# Patient Record
Sex: Male | Born: 1940 | Race: White | Hispanic: No | Marital: Married | Smoking: Never smoker
Health system: Southern US, Community
[De-identification: ages and names within clinical notes are randomized; demographics above are authoritative.]

## PROBLEM LIST (undated history)

## (undated) DIAGNOSIS — K649 Unspecified hemorrhoids: Secondary | ICD-10-CM

## (undated) DIAGNOSIS — K3189 Other diseases of stomach and duodenum: Secondary | ICD-10-CM

## (undated) DIAGNOSIS — I639 Cerebral infarction, unspecified: Secondary | ICD-10-CM

## (undated) DIAGNOSIS — I739 Peripheral vascular disease, unspecified: Secondary | ICD-10-CM

## (undated) DIAGNOSIS — E785 Hyperlipidemia, unspecified: Secondary | ICD-10-CM

## (undated) DIAGNOSIS — I509 Heart failure, unspecified: Secondary | ICD-10-CM

## (undated) DIAGNOSIS — J302 Other seasonal allergic rhinitis: Secondary | ICD-10-CM

## (undated) DIAGNOSIS — E119 Type 2 diabetes mellitus without complications: Secondary | ICD-10-CM

## (undated) DIAGNOSIS — I209 Angina pectoris, unspecified: Secondary | ICD-10-CM

## (undated) DIAGNOSIS — J339 Nasal polyp, unspecified: Secondary | ICD-10-CM

## (undated) DIAGNOSIS — K2970 Gastritis, unspecified, without bleeding: Secondary | ICD-10-CM

## (undated) DIAGNOSIS — I10 Essential (primary) hypertension: Secondary | ICD-10-CM

## (undated) DIAGNOSIS — N400 Enlarged prostate without lower urinary tract symptoms: Secondary | ICD-10-CM

## (undated) DIAGNOSIS — Z951 Presence of aortocoronary bypass graft: Secondary | ICD-10-CM

## (undated) DIAGNOSIS — I2510 Atherosclerotic heart disease of native coronary artery without angina pectoris: Secondary | ICD-10-CM

## (undated) DIAGNOSIS — K449 Diaphragmatic hernia without obstruction or gangrene: Secondary | ICD-10-CM

## (undated) HISTORY — PX: HERNIA REPAIR: SHX51

## (undated) HISTORY — PX: CAROTID ENDARTERECTOMY: SUR193

## (undated) HISTORY — DX: Heart failure, unspecified: I50.9

## (undated) HISTORY — PX: CORONARY ARTERY BYPASS GRAFT: SHX141

## (undated) HISTORY — PX: TONSILLECTOMY: SUR1361

---

## 2014-04-25 DIAGNOSIS — D649 Anemia, unspecified: Secondary | ICD-10-CM | POA: Insufficient documentation

## 2014-04-25 DIAGNOSIS — E119 Type 2 diabetes mellitus without complications: Secondary | ICD-10-CM | POA: Insufficient documentation

## 2014-06-15 DIAGNOSIS — I447 Left bundle-branch block, unspecified: Secondary | ICD-10-CM | POA: Insufficient documentation

## 2014-09-11 ENCOUNTER — Ambulatory Visit: Payer: Self-pay | Admitting: Gastroenterology

## 2015-01-15 LAB — SURGICAL PATHOLOGY

## 2015-04-12 ENCOUNTER — Other Ambulatory Visit: Payer: Self-pay | Admitting: Physician Assistant

## 2015-04-12 DIAGNOSIS — R079 Chest pain, unspecified: Secondary | ICD-10-CM

## 2015-04-18 ENCOUNTER — Ambulatory Visit
Admission: RE | Admit: 2015-04-18 | Discharge: 2015-04-18 | Disposition: A | Discharge status: Home / Self Care | Payer: Medicare Other | Source: Ambulatory Visit | Attending: Physician Assistant | Admitting: Physician Assistant

## 2015-04-18 DIAGNOSIS — R079 Chest pain, unspecified: Secondary | ICD-10-CM | POA: Diagnosis present

## 2015-04-18 DIAGNOSIS — I700 Atherosclerosis of aorta: Secondary | ICD-10-CM | POA: Diagnosis not present

## 2015-04-18 DIAGNOSIS — Z951 Presence of aortocoronary bypass graft: Secondary | ICD-10-CM | POA: Diagnosis not present

## 2015-04-18 HISTORY — DX: Presence of aortocoronary bypass graft: Z95.1

## 2015-04-18 HISTORY — DX: Essential (primary) hypertension: I10

## 2015-04-18 HISTORY — DX: Type 2 diabetes mellitus without complications: E11.9

## 2015-04-18 MED ORDER — IOHEXOL 300 MG/ML  SOLN
75.0000 mL | Freq: Once | INTRAMUSCULAR | Status: AC | PRN
  Administered 2015-04-18: 75 mL via INTRAVENOUS

## 2016-05-19 ENCOUNTER — Ambulatory Visit
Admission: RE | Admit: 2016-05-19 | Discharge: 2016-05-19 | Disposition: A | Discharge status: Home / Self Care | Payer: Medicare Other | Source: Ambulatory Visit | Attending: Cardiology | Admitting: Cardiology

## 2016-05-19 ENCOUNTER — Other Ambulatory Visit: Payer: Self-pay | Admitting: Cardiology

## 2016-05-19 DIAGNOSIS — R0602 Shortness of breath: Secondary | ICD-10-CM | POA: Diagnosis present

## 2016-05-19 DIAGNOSIS — M7989 Other specified soft tissue disorders: Secondary | ICD-10-CM | POA: Insufficient documentation

## 2016-05-19 DIAGNOSIS — R188 Other ascites: Secondary | ICD-10-CM | POA: Diagnosis not present

## 2016-05-19 DIAGNOSIS — M79604 Pain in right leg: Secondary | ICD-10-CM | POA: Insufficient documentation

## 2016-05-19 DIAGNOSIS — R938 Abnormal findings on diagnostic imaging of other specified body structures: Secondary | ICD-10-CM | POA: Insufficient documentation

## 2016-05-19 DIAGNOSIS — J90 Pleural effusion, not elsewhere classified: Secondary | ICD-10-CM | POA: Insufficient documentation

## 2016-05-19 MED ORDER — IOPAMIDOL (ISOVUE-370) INJECTION 76%
75.0000 mL | Freq: Once | INTRAVENOUS | Status: AC | PRN
  Administered 2016-05-19: 75 mL via INTRAVENOUS

## 2016-06-10 ENCOUNTER — Encounter
Admission: RE | Disposition: A | Discharge status: Home / Self Care | Payer: Self-pay | Source: Ambulatory Visit | Attending: Cardiology

## 2016-06-10 ENCOUNTER — Encounter: Payer: Self-pay | Admitting: *Deleted

## 2016-06-10 ENCOUNTER — Ambulatory Visit
Admission: RE | Admit: 2016-06-10 | Discharge: 2016-06-11 | Disposition: A | Discharge status: Home / Self Care | Payer: Medicare Other | Source: Ambulatory Visit | Attending: Cardiology | Admitting: Cardiology

## 2016-06-10 DIAGNOSIS — N400 Enlarged prostate without lower urinary tract symptoms: Secondary | ICD-10-CM | POA: Diagnosis not present

## 2016-06-10 DIAGNOSIS — Z809 Family history of malignant neoplasm, unspecified: Secondary | ICD-10-CM | POA: Insufficient documentation

## 2016-06-10 DIAGNOSIS — Z8673 Personal history of transient ischemic attack (TIA), and cerebral infarction without residual deficits: Secondary | ICD-10-CM | POA: Insufficient documentation

## 2016-06-10 DIAGNOSIS — Z8249 Family history of ischemic heart disease and other diseases of the circulatory system: Secondary | ICD-10-CM | POA: Insufficient documentation

## 2016-06-10 DIAGNOSIS — Z7982 Long term (current) use of aspirin: Secondary | ICD-10-CM | POA: Insufficient documentation

## 2016-06-10 DIAGNOSIS — K449 Diaphragmatic hernia without obstruction or gangrene: Secondary | ICD-10-CM | POA: Insufficient documentation

## 2016-06-10 DIAGNOSIS — E785 Hyperlipidemia, unspecified: Secondary | ICD-10-CM | POA: Diagnosis not present

## 2016-06-10 DIAGNOSIS — I10 Essential (primary) hypertension: Secondary | ICD-10-CM | POA: Insufficient documentation

## 2016-06-10 DIAGNOSIS — I200 Unstable angina: Secondary | ICD-10-CM | POA: Diagnosis present

## 2016-06-10 DIAGNOSIS — Z885 Allergy status to narcotic agent status: Secondary | ICD-10-CM | POA: Diagnosis not present

## 2016-06-10 DIAGNOSIS — I739 Peripheral vascular disease, unspecified: Secondary | ICD-10-CM | POA: Insufficient documentation

## 2016-06-10 DIAGNOSIS — Z79899 Other long term (current) drug therapy: Secondary | ICD-10-CM | POA: Insufficient documentation

## 2016-06-10 DIAGNOSIS — I2510 Atherosclerotic heart disease of native coronary artery without angina pectoris: Secondary | ICD-10-CM | POA: Diagnosis present

## 2016-06-10 DIAGNOSIS — Z951 Presence of aortocoronary bypass graft: Secondary | ICD-10-CM | POA: Insufficient documentation

## 2016-06-10 HISTORY — DX: Cerebral infarction, unspecified: I63.9

## 2016-06-10 HISTORY — DX: Angina pectoris, unspecified: I20.9

## 2016-06-10 HISTORY — DX: Hyperlipidemia, unspecified: E78.5

## 2016-06-10 HISTORY — PX: CARDIAC CATHETERIZATION: SHX172

## 2016-06-10 HISTORY — DX: Atherosclerotic heart disease of native coronary artery without angina pectoris: I25.10

## 2016-06-10 HISTORY — DX: Diaphragmatic hernia without obstruction or gangrene: K44.9

## 2016-06-10 HISTORY — DX: Other seasonal allergic rhinitis: J30.2

## 2016-06-10 HISTORY — DX: Peripheral vascular disease, unspecified: I73.9

## 2016-06-10 HISTORY — DX: Benign prostatic hyperplasia without lower urinary tract symptoms: N40.0

## 2016-06-10 HISTORY — DX: Other diseases of stomach and duodenum: K31.89

## 2016-06-10 HISTORY — DX: Nasal polyp, unspecified: J33.9

## 2016-06-10 HISTORY — DX: Gastritis, unspecified, without bleeding: K29.70

## 2016-06-10 HISTORY — DX: Unspecified hemorrhoids: K64.9

## 2016-06-10 LAB — PLATELET COUNT: Platelets: 181 10*3/uL (ref 150–440)

## 2016-06-10 SURGERY — CORONARY/GRAFT ANGIOGRAPHY
Anesthesia: Moderate Sedation

## 2016-06-10 MED ORDER — CARVEDILOL 6.25 MG PO TABS
6.2500 mg | ORAL_TABLET | Freq: Two times a day (BID) | ORAL | Status: DC
  Administered 2016-06-10 – 2016-06-11 (×2): 6.25 mg via ORAL
  Filled 2016-06-10 (×2): qty 1

## 2016-06-10 MED ORDER — ASPIRIN 81 MG PO CHEW: 81.0000 mg | CHEWABLE_TABLET | ORAL | Status: DC

## 2016-06-10 MED ORDER — ACETAMINOPHEN 325 MG PO TABS: 650.0000 mg | ORAL_TABLET | ORAL | Status: DC | PRN

## 2016-06-10 MED ORDER — BIVALIRUDIN 250 MG IV SOLR
INTRAVENOUS | Status: AC
  Filled 2016-06-10: qty 250

## 2016-06-10 MED ORDER — ASPIRIN EC 325 MG PO TBEC
325.0000 mg | DELAYED_RELEASE_TABLET | Freq: Every day | ORAL | Status: DC
  Administered 2016-06-10 – 2016-06-11 (×2): 325 mg via ORAL
  Filled 2016-06-10: qty 1

## 2016-06-10 MED ORDER — ASPIRIN 81 MG PO CHEW
CHEWABLE_TABLET | ORAL | Status: AC
  Filled 2016-06-10: qty 4

## 2016-06-10 MED ORDER — SODIUM CHLORIDE 0.9 % WEIGHT BASED INFUSION: 3.0000 mL/kg/h | INTRAVENOUS | Status: DC

## 2016-06-10 MED ORDER — CLOPIDOGREL BISULFATE 75 MG PO TABS
ORAL_TABLET | ORAL | Status: DC | PRN
  Administered 2016-06-10: 600 mg via ORAL

## 2016-06-10 MED ORDER — SODIUM CHLORIDE 0.9% FLUSH: 3.0000 mL | Freq: Two times a day (BID) | INTRAVENOUS | Status: DC

## 2016-06-10 MED ORDER — SODIUM CHLORIDE 0.9 % WEIGHT BASED INFUSION: 1.0000 mL/kg/h | INTRAVENOUS | Status: DC

## 2016-06-10 MED ORDER — SODIUM CHLORIDE 0.9% FLUSH: 3.0000 mL | INTRAVENOUS | Status: DC | PRN

## 2016-06-10 MED ORDER — CLOPIDOGREL BISULFATE 75 MG PO TABS
ORAL_TABLET | ORAL | Status: AC
  Filled 2016-06-10: qty 5

## 2016-06-10 MED ORDER — FENTANYL CITRATE (PF) 100 MCG/2ML IJ SOLN
INTRAMUSCULAR | Status: DC | PRN
  Administered 2016-06-10: 25 ug via INTRAVENOUS

## 2016-06-10 MED ORDER — CLOPIDOGREL BISULFATE 75 MG PO TABS: 75.0000 mg | ORAL_TABLET | Freq: Every day | ORAL | Status: DC

## 2016-06-10 MED ORDER — SODIUM CHLORIDE 0.9 % IV SOLN: 250.0000 mL | INTRAVENOUS | Status: DC | PRN

## 2016-06-10 MED ORDER — ATROPINE SULFATE 1 MG/10ML IJ SOSY
PREFILLED_SYRINGE | INTRAMUSCULAR | Status: AC
  Filled 2016-06-10: qty 10

## 2016-06-10 MED ORDER — LOSARTAN POTASSIUM 50 MG PO TABS
50.0000 mg | ORAL_TABLET | Freq: Every day | ORAL | Status: DC
  Administered 2016-06-10 – 2016-06-11 (×2): 50 mg via ORAL
  Filled 2016-06-10 (×2): qty 1

## 2016-06-10 MED ORDER — ATROPINE SULFATE 1 MG/10ML IJ SOSY
PREFILLED_SYRINGE | INTRAMUSCULAR | Status: DC | PRN
  Administered 2016-06-10: 1 mg via INTRAVENOUS

## 2016-06-10 MED ORDER — MIDAZOLAM HCL 2 MG/2ML IJ SOLN
INTRAMUSCULAR | Status: DC | PRN
  Administered 2016-06-10: 1 mg via INTRAVENOUS

## 2016-06-10 MED ORDER — EZETIMIBE 10 MG PO TABS
10.0000 mg | ORAL_TABLET | Freq: Every day | ORAL | Status: DC
  Administered 2016-06-10 – 2016-06-11 (×2): 10 mg via ORAL
  Filled 2016-06-10 (×2): qty 1

## 2016-06-10 MED ORDER — SODIUM CHLORIDE 0.9 % IV SOLN: INTRAVENOUS | Status: DC | PRN

## 2016-06-10 MED ORDER — CLOPIDOGREL BISULFATE 75 MG PO TABS
ORAL_TABLET | ORAL | Status: AC
  Filled 2016-06-10: qty 3

## 2016-06-10 MED ORDER — TIROFIBAN (AGGRASTAT) BOLUS VIA INFUSION
INTRAVENOUS | Status: DC | PRN
  Administered 2016-06-10: 1995 ug via INTRAVENOUS

## 2016-06-10 MED ORDER — ONDANSETRON HCL 4 MG/2ML IJ SOLN: 4.0000 mg | Freq: Four times a day (QID) | INTRAMUSCULAR | Status: DC | PRN

## 2016-06-10 MED ORDER — TIROFIBAN HCL IN NACL 5-0.9 MG/100ML-% IV SOLN
INTRAVENOUS | Status: AC
  Filled 2016-06-10: qty 100

## 2016-06-10 MED ORDER — NITROGLYCERIN 1 MG/10 ML FOR IR/CATH LAB
INTRA_ARTERIAL | Status: DC | PRN
  Administered 2016-06-10 (×3): 200 ug via INTRACORONARY

## 2016-06-10 MED ORDER — FUROSEMIDE 40 MG PO TABS
40.0000 mg | ORAL_TABLET | Freq: Every day | ORAL | Status: DC
  Administered 2016-06-10 – 2016-06-11 (×2): 40 mg via ORAL
  Filled 2016-06-10 (×2): qty 1

## 2016-06-10 MED ORDER — ASPIRIN EC 325 MG PO TBEC
325.0000 mg | DELAYED_RELEASE_TABLET | Freq: Every day | ORAL | Status: DC
Start: 2016-06-10 — End: 2016-06-10

## 2016-06-10 MED ORDER — FENTANYL CITRATE (PF) 100 MCG/2ML IJ SOLN
INTRAMUSCULAR | Status: AC
  Filled 2016-06-10: qty 2

## 2016-06-10 MED ORDER — SODIUM CHLORIDE 0.9 % IV SOLN
INTRAVENOUS | Status: DC | PRN
  Administered 2016-06-10: 1.75 mg/kg/h via INTRAVENOUS

## 2016-06-10 MED ORDER — HEPARIN (PORCINE) IN NACL 2-0.9 UNIT/ML-% IJ SOLN
INTRAMUSCULAR | Status: AC
  Filled 2016-06-10: qty 1000

## 2016-06-10 MED ORDER — CLOPIDOGREL BISULFATE 75 MG PO TABS
75.0000 mg | ORAL_TABLET | Freq: Every day | ORAL | Status: DC
  Administered 2016-06-11: 75 mg via ORAL
  Filled 2016-06-10: qty 1

## 2016-06-10 MED ORDER — IOPAMIDOL (ISOVUE-300) INJECTION 61%
INTRAVENOUS | Status: DC | PRN
  Administered 2016-06-10: 120 mL via INTRA_ARTERIAL

## 2016-06-10 MED ORDER — METFORMIN HCL 500 MG PO TABS
1000.0000 mg | ORAL_TABLET | Freq: Two times a day (BID) | ORAL | Status: DC
  Filled 2016-06-10: qty 2

## 2016-06-10 MED ORDER — IOPAMIDOL (ISOVUE-300) INJECTION 61%
INTRAVENOUS | Status: DC | PRN
Start: 2016-06-10 — End: 2016-06-10
  Administered 2016-06-10: 130 mL via INTRA_ARTERIAL

## 2016-06-10 MED ORDER — ASPIRIN 81 MG PO CHEW
CHEWABLE_TABLET | ORAL | Status: DC | PRN
  Administered 2016-06-10: 324 mg via ORAL

## 2016-06-10 MED ORDER — NITROGLYCERIN 5 MG/ML IV SOLN
INTRAVENOUS | Status: AC
  Filled 2016-06-10: qty 10

## 2016-06-10 MED ORDER — MIDAZOLAM HCL 2 MG/2ML IJ SOLN
INTRAMUSCULAR | Status: AC
  Filled 2016-06-10: qty 2

## 2016-06-10 MED ORDER — SODIUM CHLORIDE 0.9 % WEIGHT BASED INFUSION
3.0000 mL/kg/h | INTRAVENOUS | Status: AC
Start: 2016-06-10 — End: 2016-06-10

## 2016-06-10 MED ORDER — TIROFIBAN HCL IN NACL 5-0.9 MG/100ML-% IV SOLN
INTRAVENOUS | Status: DC | PRN
  Administered 2016-06-10: 0.15 ug/kg/min via INTRAVENOUS

## 2016-06-10 MED ORDER — BIVALIRUDIN BOLUS VIA INFUSION - CUPID
INTRAVENOUS | Status: DC | PRN
  Administered 2016-06-10: 59.85 mg via INTRAVENOUS

## 2016-06-10 MED ORDER — TIROFIBAN HCL IN NACL 5-0.9 MG/100ML-% IV SOLN
0.1500 ug/kg/min | INTRAVENOUS | Status: AC
  Administered 2016-06-10: 0.15 ug/kg/min via INTRAVENOUS
  Filled 2016-06-10 (×4): qty 100

## 2016-06-10 SURGICAL SUPPLY — 21 items
BALLN TREK RX 2.5X15 (BALLOONS) ×4
BALLOON TREK RX 2.5X15 (BALLOONS) ×1 IMPLANT
CATH 5FR IM DIAGNOSTIC (CATHETERS) ×3 IMPLANT
CATH INFINITI 5 FR MPA2 (CATHETERS) ×3 IMPLANT
CATH INFINITI 5 FR RCB (CATHETERS) ×3 IMPLANT
CATH INFINITI 5FR ANG PIGTAIL (CATHETERS) ×3 IMPLANT
CATH INFINITI 5FR JL4 (CATHETERS) ×3 IMPLANT
CATH INFINITI JR4 5F (CATHETERS) ×3 IMPLANT
CATH VISTA GUIDE 6FR JR4 (CATHETERS) ×3 IMPLANT
DEVICE CLOSURE MYNXGRIP 6/7F (Vascular Products) ×3 IMPLANT
DEVICE INFLAT 30 PLUS (MISCELLANEOUS) ×3 IMPLANT
KIT MANI 3VAL PERCEP (MISCELLANEOUS) ×4 IMPLANT
NDL PERC 18GX7CM (NEEDLE) IMPLANT
NEEDLE PERC 18GX7CM (NEEDLE) ×4 IMPLANT
PACK CARDIAC CATH (CUSTOM PROCEDURE TRAY) ×4 IMPLANT
SHEATH AVANTI 5FR X 11CM (SHEATH) ×3 IMPLANT
SHEATH AVANTI 6FR X 11CM (SHEATH) ×3 IMPLANT
STENT XIENCE ALPINE RX 3.25X18 (Permanent Stent) ×3 IMPLANT
WIRE ASAHI PROWATER 180CM (WIRE) ×3 IMPLANT
WIRE EMERALD 3MM-J .035X150CM (WIRE) ×3 IMPLANT
WIRE EMERALD 3MM-J .035X260CM (WIRE) ×3 IMPLANT

## 2016-06-10 NOTE — OR Nursing (Signed)
Pt voided 50 ml clear yellow urine x 2, reporting feeling full post void, bladder distended. Dr Lady Garyfath notified, in and out cath done. 750ml out

## 2016-06-10 NOTE — Progress Notes (Signed)
MEDICATION RELATED CONSULT NOTE - INITIAL   Pharmacy Consult for Tirofiban (Aggrastat) Monitoring Indication: post PCI  Allergies  Allergen Reactions  . Codeine Nausea And Vomiting    Patient Measurements: Height: 5\' 11"  (180.3 cm) Weight: 158 lb 12.8 oz (72 kg) IBW/kg (Calculated) : 75.3  Vital Signs: Temp: 97.4 F (36.3 C) (09/19 1111) Temp Source: Oral (09/19 0716) BP: 141/77 (09/19 1111) Pulse Rate: 67 (09/19 1111)  Microbiology: No results found for this or any previous visit (from the past 720 hour(s)).  Medical History: Past Medical History:  Diagnosis Date  . Anginal pain (HCC)   . BPH (benign prostatic hyperplasia)   . Coronary artery disease   . Diabetes mellitus without complication [facility](HCC)    Patient takes Metformin.  . Erosive gastropathy   . Gastritis   . Hemorrhoids   . Hiatal hernia   . Hyperlipidemia   . Hypertension   . Nasal polyp   . Peripheral vascular disease (HCC)   . S/P CABG x 3   . Seasonal allergies   . Stroke [facility](HCC)     Assessment: 75 yo male on tirofiban post- PCI.   9/11 Scr 1.1 last weight: 78.1Kg  CrCl: 3463ml/min  Plan:  Tirofiban dosed appropriately at 0.9115mcg/kg/min based on CrCl of 7763ml/min  Will f/u and contact nurse 18 hours post PCI to confirm infusion has stopped.   F/U planned for 9/20 @0400   Cher NakaiSheema Marquiz Sotelo, PharmD Clinical Pharmacist 06/10/2016 2:34 PM

## 2016-06-10 NOTE — Care Management (Signed)
Elective cardiac cath resulting in PCI.  At present, is not on any cost prohibitive anti platelet medication

## 2016-06-10 NOTE — H&P (Signed)
Chief Complaint: Chief Complaint  Patient presents with  . 2 week follow up  I still dont have any energy--pt started on lasix after ct on chest-- ? schedule cath today  Date of Service: 06/02/2016 Date of Birth: 04/28/1941 PCP: Marguarite Arbour, MD, MD  History of Present Illness: Reginald Franklin is a 75 y.o.male patient who returns follow-up visit. Has a history of peripheral vascular disease status post left carotid end arterectomy was CVA as well as coronary artery disease status post CABG x 3, 10-09-95 per Dr. Tyrone Sage.. Recently noted progressive shortness of breath and fatigue. Echocardiogram in February showed an EF of 30%. He is complained of progressive shortness of breath. He also complains of right leg swelling and discomfort over the weekend. Leg swelling has resolved. He is referred for consideration for invasive cardiac evaluation due to his symptoms. He has shortness of breath and weakness. Laboratories last week revealed normal renal function with only elevated bilirubin 1.3. He denies chest pain. Due to atypical swelling in his leg and chest pain he underwent a lower extremity ultrasound which revealed no DVT. Chest CT revealed no pulmonary embolus. Continues to have symptoms. He has exertional chest discomfort and shortness of breath similar to what he had prior to his CABG. Past Medical and Surgical History  Past Medical History Past Medical History:  Diagnosis Date  . Angina, class I (CMS-HCC)  . BPH (benign prostatic hypertrophy)  . CAD (coronary artery disease) 1997  s/p CABG  . Environmental allergies  . Erosive gastropathy 09/11/2014  . Gastritis 09/11/2014  Moderate Chronic  . HH (hiatus hernia) 09/11/2014  Medium-sized  . History of nasal polyp  . Hyperlipidemia  . Hypertension  . Internal hemorrhoids 09/11/2014  . PVD (peripheral vascular disease) (CMS-HCC) 1988  s/p left carotid endarterectomy  . Stroke (CMS-HCC)   Past Surgical History He has a past surgical  history that includes Hernia repair (Left); Right body CVA (1988); Coronary artery bypass graft (1997); Colonoscopy (09/11/2014); and egd (09/11/2014).   Medications and Allergies  Current Medications  Current Outpatient Prescriptions  Medication Sig Dispense Refill  . aspirin 325 MG EC tablet Take 325 mg by mouth once daily.  Marland Kitchen BETA-CAROTENE,A, W-C & E/MIN (VISION FORMULA ORAL) Take by mouth.  . carvedilol (COREG) 6.25 MG tablet Take 1 tablet (6.25 mg total) by mouth 2 (two) times daily with meals. 60 tablet 11  . CETIRIZINE HCL (ZYRTEC ORAL) Take by mouth.  . cholecalciferol (CHOLECALCIFEROL) 1,000 unit tablet Take by mouth once daily.  Marland Kitchen ezetimibe (ZETIA) 10 mg tablet TAKE ONE (1) TABLET EACH DAY FOR CHOLESTEROL 30 tablet 5  . FUROsemide (LASIX) 40 MG tablet Take 1 tablet (40 mg total) by mouth once daily. 30 tablet 11  . losartan (COZAAR) 50 MG tablet Take 1 tablet (50 mg total) by mouth once daily. 30 tablet 11  . lutein 20 mg Tab Take 1 tablet by mouth once daily.  . meloxicam (MOBIC) 7.5 MG tablet Take 1 tablet (7.5 mg total) by mouth once daily. 30 tablet 3  . metFORMIN (GLUCOPHAGE) 1000 MG tablet TAKE ONE TABLET TWICE DAILY 60 tablet 5  . MULTIVITAMIN ORAL Take by mouth.   No current facility-administered medications for this visit.   Allergies: Codeine  Social and Family History  Social History reports that he has never smoked. He has never used smokeless tobacco. He reports that he does not drink alcohol.  Family History Family History  Problem Relation Age of Onset  . Cancer  Mother  . Heart attack Father   Review of Systems  Review of Systems  Constitutional: Negative for chills, diaphoresis, fever, malaise/fatigue and weight loss.  HENT: Negative for congestion, ear discharge, hearing loss and tinnitus.  Eyes: Negative for blurred vision.  Respiratory: Positive for shortness of breath. Negative for cough, hemoptysis, sputum production and wheezing.   Cardiovascular: Positive for chest pain and leg swelling. Negative for palpitations, orthopnea, claudication and PND.  Gastrointestinal: Negative for abdominal pain, blood in stool, constipation, diarrhea, heartburn, melena, nausea and vomiting.  Genitourinary: Negative for dysuria, frequency, hematuria and urgency.  Musculoskeletal: Negative for back pain, falls, joint pain and myalgias.  Skin: Negative for itching and rash.  Neurological: Positive for focal weakness. Negative for dizziness, tingling, loss of consciousness, weakness and headaches.  Endo/Heme/Allergies: Negative for polydipsia. Does not bruise/bleed easily.  Psychiatric/Behavioral: Negative for depression, memory loss and substance abuse. The patient is not nervous/anxious.    Physical Examination   Vitals: BP 130/84  Pulse 80  Resp 12  Ht 180.3 cm (5\' 11" )  Wt 74.4 kg (164 lb)  BMI 22.87 kg/m2 Ht:180.3 cm (5\' 11" ) Wt:74.4 kg (164 lb) ZOX:WRUEBSA:Body surface area is 1.93 meters squared. Body mass index is 22.87 kg/(m^2).  Wt Readings from Last 3 Encounters:  06/02/16 74.4 kg (164 lb)  05/19/16 79.8 kg (176 lb)  05/14/16 (P) 78 kg (172 lb)   BP Readings from Last 3 Encounters:  06/02/16 130/84  05/19/16 130/84  05/14/16 (P) 140/78   General appearance appears in no acute distress  Head Mouth and Eye exam Normocephalic, without obvious abnormality, atraumatic Dentition is good Eyes appear anicteric   Neck exam Thyroid: normal  Nodes: no obvious adenopathy  LUNGS Breath Sounds: Normal Percussion: Normal  CARDIOVASCULAR JVP CV wave: no HJR: no Elevation at 90 degrees: None Carotid Pulse: normal pulsation bilaterally Bruit: carotid bilateral Apex: apical impulse normal  Auscultation Rhythm: normal sinus rhythm S1: normal S2: normal Clicks: no Rub: no Murmurs: no murmurs  Gallop: None ABDOMEN Liver enlargement: no Pulsatile aorta: no Ascites: no Bruits: no  EXTREMITIES Clubbing:  no Edema: trace to 1+ bilateral pedal edema Pulses: peripheral pulses symmetrical Femoral Bruits: no Amputation: no SKIN Rash: no Cyanosis: no Embolic phemonenon: no Bruising: no NEURO Alert and Oriented to person, place and time: yes Non focal: yes  PSYCH: Pt appears to have normal affect  LABS REVIEWED Last 3 CBC results: Lab Results  Component Value Date  WBC 7.1 05/07/2016  WBC 5.2 11/08/2015  WBC 6.8 04/11/2015   Lab Results  Component Value Date  HGB 13.6 (L) 05/07/2016  HGB 13.3 (L) 11/08/2015  HGB 14.3 04/11/2015   Lab Results  Component Value Date  HCT 43.0 05/07/2016  HCT 41.4 11/08/2015  HCT 43.7 04/11/2015   Lab Results  Component Value Date  PLT 212 05/07/2016  PLT 206 11/08/2015  PLT 232 04/11/2015   Lab Results  Component Value Date  CREATININE 0.9 05/07/2016  BUN 20 05/07/2016  NA 141 05/07/2016  K 4.1 05/07/2016  CL 105 05/07/2016  CO2 29.5 05/07/2016   Lab Results  Component Value Date  HGBA1C 6.6 (H) 05/07/2016   Lab Results  Component Value Date  HDL 25.6 (L) 05/07/2016  HDL 28.4 (L) 11/08/2015  HDL 31.7 05/03/2015   Lab Results  Component Value Date  LDLCALC 228 (H) 05/07/2016  LDLCALC 174 (H) 11/08/2015  LDLCALC 230 (H) 05/03/2015   Lab Results  Component Value Date  TRIG 96 05/07/2016  TRIG 97 11/08/2015  TRIG 251 (H) 05/03/2015   Lab Results  Component Value Date  ALT 18 05/07/2016  AST 18 05/07/2016  ALKPHOS 50 05/07/2016   Assessment and Plan   75 y.o. male with  ICD-10-CM ICD-9-CM  1. Angina, class I-no evidence of angina at present. Does not appear to have significant ischemic changes. Will continue with current regimen and proceed with cardiac catheterization due to unstable angina. I20.9 413.9  2. Coronary artery disease involving native coronary artery of native heart without angina pectoris-as per above. I25.10 414.01  3. Complete left bundle branch block-stable with left bundle-branch block which is  chronic I44.7 426.3  4. Pure hypercholesterolemia-Will continue with low-fat diet and Zetia. Patient has an intolerance to statins. Will continue with this regimen with an LDL goal of less than 100. E78.0 272.0  5. Essential hypertension-blood pressure controlled with losartan I10 401.9  6. PVD (peripheral vascular disease)-clinically stable I73.9 443.9   Return in about 2 weeks (around 06/16/2016).  These notes generated with voice recognition software. I apologize for typographical errors.  Denton Ar, MD

## 2016-06-11 DIAGNOSIS — I2510 Atherosclerotic heart disease of native coronary artery without angina pectoris: Secondary | ICD-10-CM | POA: Diagnosis not present

## 2016-06-11 LAB — BASIC METABOLIC PANEL
ANION GAP: 7 (ref 5–15)
BUN: 26 mg/dL — ABNORMAL HIGH (ref 6–20)
CO2: 31 mmol/L (ref 22–32)
Calcium: 9.3 mg/dL (ref 8.9–10.3)
Chloride: 103 mmol/L (ref 101–111)
Creatinine, Ser: 1.08 mg/dL (ref 0.61–1.24)
GFR calc Af Amer: >60 mL/min (ref >60)
GFR calc non Af Amer: >60 mL/min (ref >60)
GLUCOSE: 123 mg/dL — AB (ref 65–99)
Potassium: 3.8 mmol/L (ref 3.5–5.1)
Sodium: 141 mmol/L (ref 135–145)

## 2016-06-11 LAB — CBC
HEMATOCRIT: 47.9 % (ref 40.0–52.0)
HEMOGLOBIN: 15.7 g/dL (ref 13.0–18.0)
MCH: 27.9 pg (ref 26.0–34.0)
MCHC: 32.8 g/dL (ref 32.0–36.0)
MCV: 85.1 fL (ref 80.0–100.0)
Platelets: 194 10*3/uL (ref 150–440)
RBC: 5.63 MIL/uL (ref 4.40–5.90)
RDW: 17.9 % — ABNORMAL HIGH (ref 11.5–14.5)
WBC: 8.3 10*3/uL (ref 3.8–10.6)

## 2016-06-11 MED ORDER — CLOPIDOGREL BISULFATE 75 MG PO TABS: 75.0000 mg | ORAL_TABLET | Freq: Every day | ORAL | 11 refills | Status: AC

## 2016-06-11 MED ORDER — ASPIRIN 325 MG PO TBEC: 325.0000 mg | DELAYED_RELEASE_TABLET | Freq: Every day | ORAL | 0 refills | Status: DC

## 2016-06-11 NOTE — Progress Notes (Signed)
Patient discharged via wheelchair and private vehicle. IV removed and catheter intact. All discharge instructions given and patient verbalizes understanding. Tele removed and returned. Prescriptions given to patient No distress noted.   

## 2016-06-11 NOTE — Discharge Summary (Signed)
   Physician Discharge Summary  Patient ID: Reginald Franklin MRN: [id number] DOB/AGE: December 25, 1940 75 y.o.  Admit date: 06/10/2016 Discharge date: 06/11/2016  Primary Discharge Diagnosis coronary artery disease Secondary Discharge Diagnosis hyperlipidemia  Significant Diagnostic Studies: left cardiac catheterization and pci of svg to om1  Consults: None  Hospital Course: Pt has history of cad s/p cabg with lima to lad, svg to rca and om1. Had persistant chest pain and underwent left cardiac cath which revealed occluded lad and lcx with 90% proximal small rca. Patent lima to lad with collaterals to distal rca. Occluded svg to rca. SVG to om1 had 95% mid graft stenosis. Underwent pci of svg to om1 with des. Placed on plavix after load. No chest pain or bleeding over night. Cath site is clean and dry. Distal pulses intact. Pt able to abluate without difficulty.   Discharge Exam: Blood pressure 138/72, pulse 70, temperature 98.7 F (37.1 C), temperature source Oral, resp. rate 18, height 5\' 11"  (1.803 m), weight 71.8 kg (158 lb 3.2 oz), SpO2 96 %.    General appearance: alert and cooperative Resp: clear to auscultation bilaterally Chest wall: no tenderness Cardio: regular rate and rhythm GI: soft, non-tender; bowel sounds normal; no masses,  no organomegaly Extremities: extremities normal, atraumatic, no cyanosis or edema Pulses: 2+ and symmetric Neurologic: Grossly normal Labs:   Lab Results  Component Value Date   WBC 8.3 06/11/2016   HGB 15.7 06/11/2016   HCT 47.9 06/11/2016   MCV 85.1 06/11/2016   PLT 194 06/11/2016    Recent Labs Lab 06/11/16 0513  NA 141  K 3.8  CL 103  CO2 31  BUN 26*  CREATININE 1.08  CALCIUM 9.3  GLUCOSE 123*       EKG: nsr with lbbb  FOLLOW UP PLANS AND APPOINTMENTS Discharge Instructions    AMB referral to cardiac rehabilitation - Phase II    Complete by:  As directed    Diagnosis:  Coronary Stents           BRING ALL  MEDICATIONS WITH YOU TO FOLLOW UP APPOINTMENTS  Time spent with patient to include physician time: 30 min Signed:  Dalia HeadingFATH,Korde Jeppsen A. MD, [facility] CenterFACC 06/11/2016, 8:15 AM

## 2016-06-30 ENCOUNTER — Encounter: Payer: Medicare Other | Attending: Cardiology | Admitting: *Deleted

## 2016-06-30 VITALS — Ht 69.0 in | Wt 157.8 lb

## 2016-06-30 DIAGNOSIS — Z955 Presence of coronary angioplasty implant and graft: Secondary | ICD-10-CM | POA: Insufficient documentation

## 2016-06-30 DIAGNOSIS — Z951 Presence of aortocoronary bypass graft: Secondary | ICD-10-CM | POA: Diagnosis not present

## 2016-06-30 DIAGNOSIS — Z9889 Other specified postprocedural states: Secondary | ICD-10-CM | POA: Insufficient documentation

## 2016-06-30 DIAGNOSIS — I739 Peripheral vascular disease, unspecified: Secondary | ICD-10-CM | POA: Insufficient documentation

## 2016-06-30 DIAGNOSIS — Z48812 Encounter for surgical aftercare following surgery on the circulatory system: Secondary | ICD-10-CM | POA: Insufficient documentation

## 2016-06-30 DIAGNOSIS — Z8673 Personal history of transient ischemic attack (TIA), and cerebral infarction without residual deficits: Secondary | ICD-10-CM | POA: Diagnosis not present

## 2016-06-30 DIAGNOSIS — E785 Hyperlipidemia, unspecified: Secondary | ICD-10-CM | POA: Diagnosis not present

## 2016-06-30 DIAGNOSIS — I10 Essential (primary) hypertension: Secondary | ICD-10-CM | POA: Insufficient documentation

## 2016-06-30 DIAGNOSIS — I2510 Atherosclerotic heart disease of native coronary artery without angina pectoris: Secondary | ICD-10-CM | POA: Insufficient documentation

## 2016-06-30 NOTE — Progress Notes (Addendum)
Cardiac Individual Treatment Plan  Patient Details  Name: Reginald Franklin MRN: [id number] Date of Birth: 02-02-1941 Referring Provider:   Flowsheet Row Cardiac Rehab from 06/30/2016 in [facility] Cardiac and Pulmonary Rehab  Referring Provider  Harold Hedge MD      Initial Encounter Date:  Flowsheet Row Cardiac Rehab from 06/30/2016 in [facility] Cardiac and Pulmonary Rehab  Date  06/30/16  Referring Provider  Harold Hedge MD    06/30/2016  Visit Diagnosis: S/P coronary artery stent placement  Patient's Home Medications on Admission:  Current Outpatient Prescriptions:  .  aspirin 325 MG tablet, Take 325 mg by mouth at bedtime., Disp: , Rfl:  .  carvedilol (COREG) 6.25 MG tablet, Take 6.25 mg by mouth 2 (two) times daily with a meal., Disp: , Rfl:  .  cetirizine (ZYRTEC) 10 MG tablet, Take 10 mg by mouth at bedtime., Disp: , Rfl:  .  Cholecalciferol 10000 units TABS, Take by mouth., Disp: , Rfl:  .  clopidogrel (PLAVIX) 75 MG tablet, Take 1 tablet (75 mg total) by mouth daily with breakfast., Disp: 30 tablet, Rfl: 11 .  ezetimibe (ZETIA) 10 MG tablet, Take 10 mg by mouth at bedtime., Disp: , Rfl:  .  furosemide (LASIX) 40 MG tablet, Take 40 mg by mouth., Disp: , Rfl:  .  losartan (COZAAR) 50 MG tablet, Take 50 mg by mouth daily., Disp: , Rfl:  .  Lutein 20 MG TABS, Take by mouth., Disp: , Rfl:  .  meloxicam (MOBIC) 7.5 MG tablet, Take 7.5 mg by mouth daily., Disp: , Rfl:  .  metformin (FORTAMET) 1000 MG (OSM) 24 hr tablet, Take 1,000 mg by mouth 2 (two) times daily with a meal., Disp: , Rfl:  .  Misc Natural Products (LUTEIN 20) CAPS, Take by mouth., Disp: , Rfl:  .  Multiple Vitamin (MULTIVITAMIN) tablet, Take 1 tablet by mouth daily., Disp: , Rfl:  .  Multiple Vitamins-Minerals (VISION FORMULA EYE HEALTH PO), Take 1 tablet by mouth daily., Disp: , Rfl:  .  multivitamin-lutein (OCUVITE-LUTEIN) CAPS capsule, Take 1 capsule by mouth daily., Disp: , Rfl:  .  aspirin EC 325 MG EC tablet, Take 1  tablet (325 mg total) by mouth daily., Disp: 30 tablet, Rfl: 0  Past Medical History: Past Medical History:  Diagnosis Date  . Anginal pain (HCC)   . BPH (benign prostatic hyperplasia)   . Coronary artery disease   . Diabetes mellitus without complication [facility])    Patient takes Metformin.  . Erosive gastropathy   . Gastritis   . Hemorrhoids   . Hiatal hernia   . Hyperlipidemia   . Hypertension   . Nasal polyp   . Peripheral vascular disease (HCC)   . S/P CABG x 3   . Seasonal allergies   . Stroke [facility])     Tobacco Use: History  Smoking Status  . Never Smoker  Smokeless Tobacco  . Never Used    Labs: Recent Review Flowsheet Data    There is no flowsheet data to display.       Exercise Target Goals: Date: 06/30/16  Exercise Program Goal: Individual exercise prescription set with THRR, safety & activity barriers. Participant demonstrates ability to understand and report RPE using BORG scale, to self-measure pulse accurately, and to acknowledge the importance of the exercise prescription.  Exercise Prescription Goal: Starting with aerobic activity 30 plus minutes a day, 3 days per week for initial exercise prescription. Provide home exercise prescription and guidelines that participant acknowledges  understanding prior to discharge.  Activity Barriers & Risk Stratification:     Activity Barriers & Cardiac Risk Stratification - 06/30/16 1416      Activity Barriers & Cardiac Risk Stratification   Activity Barriers Deconditioning;Muscular Weakness;Other (comment)   Comments Slow getting up from sitting   Cardiac Risk Stratification High      6 Minute Walk:     6 Minute Walk    Row Name 06/30/16 1410         6 Minute Walk   Phase Initial     Distance 1000 feet     Walk Time 6 minutes     # of Rest Breaks 0     MPH 1.89     METS 2.5     RPE 9     VO2 Peak 8.76     Symptoms Yes (comment)     Comments Felt like he was dragging his right foot      Resting HR 76 bpm     Resting BP 142/80     Max Ex. HR 97 bpm     Max Ex. BP 150/76     2 Minute Post BP 140/70        Initial Exercise Prescription:     Initial Exercise Prescription - 06/30/16 1400      Date of Initial Exercise RX and Referring Provider   Date 06/30/16   Referring Provider Harold Hedge MD     Treadmill   MPH 1.7   Grade 0.5   Minutes 15   METs 2.45     NuStep   Level 1   Watts --  80-100 spm   Minutes 15   METs 2     Biostep-RELP   Level 1   Watts --  40-50 spm   Minutes 15   METs 2     Prescription Details   Frequency (times per week) 2   Duration Progress to 45 minutes of aerobic exercise without signs/symptoms of physical distress     Intensity   THRR 40-80% of Max Heartrate 104-131   Ratings of Perceived Exertion 11-15   Perceived Dyspnea 0-4     Progression   Progression Continue to progress workloads to maintain intensity without signs/symptoms of physical distress.     Resistance Training   Training Prescription Yes   Weight 2 lbs   Reps 10-12      Perform Capillary Blood Glucose checks as needed.  Exercise Prescription Changes:      Exercise Prescription Changes    Row Name 06/30/16 1400             Exercise Review   Progression -  Walk Test Results         Response to Exercise   Blood Pressure (Admit) 142/80       Blood Pressure (Exercise) 150/76       Blood Pressure (Exit) 140/70       Heart Rate (Admit) 76 bpm       Heart Rate (Exercise) 97 bpm       Heart Rate (Exit) 80 bpm       Oxygen Saturation (Admit) 96 %       Oxygen Saturation (Exit) 97 %       Rating of Perceived Exertion (Exercise) 9       Symptoms dragged right foot          Exercise Comments:      Exercise Comments    Row Name 06/30/16  1414           Exercise Comments Exercise goal is to be to walk faster and easier.          Discharge Exercise Prescription (Final Exercise Prescription Changes):     Exercise Prescription  Changes - 06/30/16 1400      Exercise Review   Progression --  Walk Test Results     Response to Exercise   Blood Pressure (Admit) 142/80   Blood Pressure (Exercise) 150/76   Blood Pressure (Exit) 140/70   Heart Rate (Admit) 76 bpm   Heart Rate (Exercise) 97 bpm   Heart Rate (Exit) 80 bpm   Oxygen Saturation (Admit) 96 %   Oxygen Saturation (Exit) 97 %   Rating of Perceived Exertion (Exercise) 9   Symptoms dragged right foot      Nutrition:  Target Goals: Understanding of nutrition guidelines, daily intake of sodium 1500mg , cholesterol 200mg , calories 30% from fat and 7% or less from saturated fats, daily to have 5 or more servings of fruits and vegetables.  Biometrics:     Pre Biometrics - 06/30/16 1415      Pre Biometrics   Height 5\' 9"  (1.753 m)   Weight 157 lb 12.8 oz (71.6 kg)   Waist Circumference 35 inches   Hip Circumference 36.25 inches   Waist to Hip Ratio 0.97 %   BMI (Calculated) 23.4   Single Leg Stand 26.05 seconds       Nutrition Therapy Plan and Nutrition Goals:   Nutrition Discharge: Rate Your Plate Scores:   Nutrition Goals Re-Evaluation:   Psychosocial: Target Goals: Acknowledge presence or absence of depression, maximize coping skills, provide positive support system. Participant is able to verbalize types and ability to use techniques and skills needed for reducing stress and depression.  Initial Review & Psychosocial Screening:     Initial Psych Review & Screening - 06/30/16 1232      Initial Review   Current issues with Current Sleep Concerns     Family Dynamics   Good Support System? Yes     Barriers   Psychosocial barriers to participate in program The patient should benefit from training in stress management and relaxation.     Screening Interventions   Interventions Encouraged to exercise      Quality of Life Scores:     Quality of Life - 06/30/16 1230      Quality of Life Scores   Health/Function Pre 24.86 %    Socioeconomic Pre 26 %   Psych/Spiritual Pre 24 %   Family Pre 27 %   GLOBAL Pre 28.16 %      PHQ-9: Recent Review Flowsheet Data    Dep[facility] 06/30/2016   Decreased Interest 0   Down, Depressed, Hopeless 0   PHQ - 2 Score 0   Altered sleeping 1   Tired, decreased energy 1   Change in appetite 0   Feeling bad or failure about yourself  0   Trouble concentrating 0   Moving slowly or fidgety/restless 0   Suicidal thoughts 0   PHQ-9 Score 2   Difficult doing work/chores Not difficult at all      Psychosocial Evaluation and Intervention:   Psychosocial Re-Evaluation:   Vocational Rehabilitation: Provide vocational rehab assistance to qualifying candidates.   Vocational Rehab Evaluation & Intervention:     Vocational Rehab - 06/30/16 1133      Initial Vocational Rehab Evaluation & Intervention   Assessment shows need for  Vocational Rehabilitation (P)  No      Education: Education Goals: Education classes will be provided on a weekly basis, covering required topics. Participant will state understanding/return demonstration of topics presented.  Learning Barriers/Preferences:     Learning Barriers/Preferences - 06/30/16 1419      Learning Barriers/Preferences   Learning Barriers Sight   Learning Preferences None      Education Topics: General Nutrition Guidelines/Fats and Fiber: -Group instruction provided by verbal, written material, models and posters to present the general guidelines for heart healthy nutrition. Gives an explanation and review of dietary fats and fiber.   Controlling Sodium/Reading Food Labels: -Group verbal and written material supporting the discussion of sodium use in heart healthy nutrition. Review and explanation with models, verbal and written materials for utilization of the food label.   Exercise Physiology & Risk Factors: - Group verbal and written instruction with models to review the exercise physiology of the  cardiovascular system and associated critical values. Details cardiovascular disease risk factors and the goals associated with each risk factor.   Aerobic Exercise & Resistance Training: - Gives group verbal and written discussion on the health impact of inactivity. On the components of aerobic and resistive training programs and the benefits of this training and how to safely progress through these programs.   Flexibility, Balance, General Exercise Guidelines: - Provides group verbal and written instruction on the benefits of flexibility and balance training programs. Provides general exercise guidelines with specific guidelines to those with heart or lung disease. Demonstration and skill practice provided.   Stress Management: - Provides group verbal and written instruction about the health risks of elevated stress, cause of high stress, and healthy ways to reduce stress.   Depression: - Provides group verbal and written instruction on the correlation between heart/lung disease and depressed mood, treatment options, and the stigmas associated with seeking treatment.   Anatomy & Physiology of the Heart: - Group verbal and written instruction and models provide basic cardiac anatomy and physiology, with the coronary electrical and arterial systems. Review of: AMI, Angina, Valve disease, Heart Failure, Cardiac Arrhythmia, Pacemakers, and the ICD.   Cardiac Procedures: - Group verbal and written instruction and models to describe the testing methods done to diagnose heart disease. Reviews the outcomes of the test results. Describes the treatment choices: Medical Management, Angioplasty, or Coronary Bypass Surgery.   Cardiac Medications: - Group verbal and written instruction to review commonly prescribed medications for heart disease. Reviews the medication, class of the drug, and side effects. Includes the steps to properly store meds and maintain the prescription regimen.   Go  Sex-Intimacy & Heart Disease, Get SMART - Goal Setting: - Group verbal and written instruction through game format to discuss heart disease and the return to sexual intimacy. Provides group verbal and written material to discuss and apply goal setting through the application of the S.M.A.R.T. Method.   Other Matters of the Heart: - Provides group verbal, written materials and models to describe Heart Failure, Angina, Valve Disease, and Diabetes in the realm of heart disease. Includes description of the disease process and treatment options available to the cardiac patient.   Exercise & Equipment Safety: - Individual verbal instruction and demonstration of equipment use and safety with use of the equipment.   Infection Prevention: - Provides verbal and written material to individual with discussion of infection control including proper hand washing and proper equipment cleaning during exercise session.   Falls Prevention: - Provides verbal and written material  to individual with discussion of falls prevention and safety.   Diabetes: - Individual verbal and written instruction to review signs/symptoms of diabetes, desired ranges of glucose level fasting, after meals and with exercise. Advice that pre and post exercise glucose checks will be done for 3 sessions at entry of program.    Knowledge Questionnaire Score:     Knowledge Questionnaire Score - 06/30/16 1230      Knowledge Questionnaire Score   Pre Score 18/28      Core Components/Risk Factors/Patient Goals at Admission:     Personal Goals and Risk Factors at Admission - 06/30/16 1231      Core Components/Risk Factors/Patient Goals on Admission    Weight Management Yes;Weight Maintenance   Intervention Weight Management: Develop a combined nutrition and exercise program designed to reach desired caloric intake, while maintaining appropriate intake of nutrient and fiber, sodium and fats, and appropriate energy expenditure  required for the weight goal.;Weight Management: Provide education and appropriate resources to help participant work on and attain dietary goals.   Expected Outcomes Weight Maintenance: Understanding of the daily nutrition guidelines, which includes 25-35% calories from fat, 7% or less cal from saturated fats, less than 200mg  cholesterol, less than 1.5gm of sodium, & 5 or more servings of fruits and vegetables daily   Sedentary Yes   Intervention Provide advice, education, support and counseling about physical activity/exercise needs.;Develop an individualized exercise prescription for aerobic and resistive training based on initial evaluation findings, risk stratification, comorbidities and participant's personal goals.   Expected Outcomes Achievement of increased cardiorespiratory fitness and enhanced flexibility, muscular endurance and strength shown through measurements of functional capacity and personal statement of participant.   Increase Strength and Stamina Yes   Intervention Provide advice, education, support and counseling about physical activity/exercise needs.;Develop an individualized exercise prescription for aerobic and resistive training based on initial evaluation findings, risk stratification, comorbidities and participant's personal goals.   Expected Outcomes Achievement of increased cardiorespiratory fitness and enhanced flexibility, muscular endurance and strength shown through measurements of functional capacity and personal statement of participant.   Diabetes Yes   Intervention Provide education about signs/symptoms and action to take for hypo/hyperglycemia.;Provide education about proper nutrition, including hydration, and aerobic/resistive exercise prescription along with prescribed medications to achieve blood glucose in normal ranges: Fasting glucose 65-99 mg/dL   Expected Outcomes Short Term: Participant verbalizes understanding of the signs/symptoms and immediate care of  hyper/hypoglycemia, proper foot care and importance of medication, aerobic/resistive exercise and nutrition plan for blood glucose control.;Long Term: Attainment of HbA1C < 7%.   Hypertension Yes   Intervention Provide education on lifestyle modifcations including regular physical activity/exercise, weight management, moderate sodium restriction and increased consumption of fresh fruit, vegetables, and low fat dairy, alcohol moderation, and smoking cessation.;Monitor prescription use compliance.   Expected Outcomes Short Term: Continued assessment and intervention until BP is < 140/67mm HG in hypertensive participants. < 130/11mm HG in hypertensive participants with diabetes, heart failure or chronic kidney disease.;Long Term: Maintenance of blood pressure at goal levels.   Lipids Yes   Intervention Provide education and support for participant on nutrition & aerobic/resistive exercise along with prescribed medications to achieve LDL 70mg , HDL >40mg .   Expected Outcomes Short Term: Participant states understanding of desired cholesterol values and is compliant with medications prescribed. Participant is following exercise prescription and nutrition guidelines.;Long Term: Cholesterol controlled with medications as prescribed, with individualized exercise RX and with personalized nutrition plan. Value goals: LDL < 70mg , HDL > 40 mg.  Stress Yes   Intervention Offer individual and/or small group education and counseling on adjustment to heart disease, stress management and health-related lifestyle change. Teach and support self-help strategies.;Refer participants experiencing significant psychosocial distress to appropriate mental health specialists for further evaluation and treatment. When possible, include family members and significant others in education/counseling sessions.   Expected Outcomes Short Term: Participant demonstrates changes in health-related behavior, relaxation and other stress  management skills, ability to obtain effective social support, and compliance with psychotropic medications if prescribed.;Long Term: Emotional wellbeing is indicated by absence of clinically significant psychosocial distress or social isolation.      Core Components/Risk Factors/Patient Goals Review:    Core Components/Risk Factors/Patient Goals at Discharge (Final Review):    ITP Comments:     ITP Comments    Row Name 06/30/16 1234 06/30/16 1316         ITP Comments "[name]" Randol said he has always had problems with his cholestrol being high no matter what he eats. We discussed low saturated fat diet helps as a lower cholestrol diet since he already limits his eggs/week. I made him and his wife an individual appt with the  Cardiac REhab REgistered  Dieticain.  ITP Created during Medical Review. Documntation of diagnosis in [facility] 06/10/2016.         Comments: Initial ITP

## 2016-06-30 NOTE — Progress Notes (Signed)
Daily Session Note  Patient Details  Name: Reginald Franklin MRN: [id number] Date of Birth: [birth year] Referring Provider:    Encounter Date: 06/30/2016  Check In:      Goals Met:  Personal goals reviewed  Goals Unmet:  Not Applicable  Comments:     Dr. Emily Filbert is Medical Director for [facility].

## 2016-06-30 NOTE — Patient Instructions (Addendum)
Patient Instructions  Patient Details  Name: Reginald Franklin MRN: [id number] Date of Birth: 1940-10-11 Referring Provider:  Dalia HeadingFath, Kenneth A, MD  Below are the personal goals you chose as well as exercise and nutrition goals. Our goal is to help you keep on track towards obtaining and maintaining your goals. We will be discussing your progress on these goals with you throughout the program.  Initial Exercise Prescription:     Initial Exercise Prescription - 06/30/16 1400      Date of Initial Exercise RX and Referring Provider   Date 06/30/16   Referring Provider Harold HedgeFath, Kenneth MD     Treadmill   MPH 1.7   Grade 0.5   Minutes 15   METs 2.45     NuStep   Level 1   Watts --  80-100 spm   Minutes 15   METs 2     Biostep-RELP   Level 1   Watts --  40-50 spm   Minutes 15   METs 2     Prescription Details   Frequency (times per week) 2   Duration Progress to 45 minutes of aerobic exercise without signs/symptoms of physical distress     Intensity   THRR 40-80% of Max Heartrate 104-131   Ratings of Perceived Exertion 11-15   Perceived Dyspnea 0-4     Progression   Progression Continue to progress workloads to maintain intensity without signs/symptoms of physical distress.     Resistance Training   Training Prescription Yes   Weight 2 lbs   Reps 10-12      Exercise Goals: Frequency: Be able to perform aerobic exercise three times per week working toward 3-5 days per week.  Intensity: Work with a perceived exertion of 11 (fairly light) - 15 (hard) as tolerated. Follow your new exercise prescription and watch for changes in prescription as you progress with the program. Changes will be reviewed with you when they are made.  Duration: You should be able to do 30 minutes of continuous aerobic exercise in addition to a 5 minute warm-up and a 5 minute cool-down routine.  Nutrition Goals: Your personal nutrition goals will be established when you do your nutrition  analysis with the dietician.  The following are nutrition guidelines to follow: Cholesterol < 200mg /day Sodium < 1500mg /day Fiber: Men under 50 yrs - 38 grams per day  Personal Goals:     Personal Goals and Risk Factors at Admission - 06/30/16 1231      Core Components/Risk Factors/Patient Goals on Admission    Weight Management Yes;Weight Maintenance   Intervention Weight Management: Develop a combined nutrition and exercise program designed to reach desired caloric intake, while maintaining appropriate intake of nutrient and fiber, sodium and fats, and appropriate energy expenditure required for the weight goal.;Weight Management: Provide education and appropriate resources to help participant work on and attain dietary goals.   Expected Outcomes Weight Maintenance: Understanding of the daily nutrition guidelines, which includes 25-35% calories from fat, 7% or less cal from saturated fats, less than 200mg  cholesterol, less than 1.5gm of sodium, & 5 or more servings of fruits and vegetables daily   Sedentary Yes   Intervention Provide advice, education, support and counseling about physical activity/exercise needs.;Develop an individualized exercise prescription for aerobic and resistive training based on initial evaluation findings, risk stratification, comorbidities and participant's personal goals.   Expected Outcomes Achievement of increased cardiorespiratory fitness and enhanced flexibility, muscular endurance and strength shown through measurements of functional capacity and personal statement  of participant.   Increase Strength and Stamina Yes   Intervention Provide advice, education, support and counseling about physical activity/exercise needs.;Develop an individualized exercise prescription for aerobic and resistive training based on initial evaluation findings, risk stratification, comorbidities and participant's personal goals.   Expected Outcomes Achievement of increased  cardiorespiratory fitness and enhanced flexibility, muscular endurance and strength shown through measurements of functional capacity and personal statement of participant.   Diabetes Yes   Intervention Provide education about signs/symptoms and action to take for hypo/hyperglycemia.;Provide education about proper nutrition, including hydration, and aerobic/resistive exercise prescription along with prescribed medications to achieve blood glucose in normal ranges: Fasting glucose 65-99 mg/dL   Expected Outcomes Short Term: Participant verbalizes understanding of the signs/symptoms and immediate care of hyper/hypoglycemia, proper foot care and importance of medication, aerobic/resistive exercise and nutrition plan for blood glucose control.;Long Term: Attainment of HbA1C < 7%.   Hypertension Yes   Intervention Provide education on lifestyle modifcations including regular physical activity/exercise, weight management, moderate sodium restriction and increased consumption of fresh fruit, vegetables, and low fat dairy, alcohol moderation, and smoking cessation.;Monitor prescription use compliance.   Expected Outcomes Short Term: Continued assessment and intervention until BP is < 140/22mm HG in hypertensive participants. < 130/11mm HG in hypertensive participants with diabetes, heart failure or chronic kidney disease.;Long Term: Maintenance of blood pressure at goal levels.   Lipids Yes   Intervention Provide education and support for participant on nutrition & aerobic/resistive exercise along with prescribed medications to achieve LDL 70mg , HDL >40mg .   Expected Outcomes Short Term: Participant states understanding of desired cholesterol values and is compliant with medications prescribed. Participant is following exercise prescription and nutrition guidelines.;Long Term: Cholesterol controlled with medications as prescribed, with individualized exercise RX and with personalized nutrition plan. Value goals:  LDL < 70mg , HDL > 40 mg.   Stress Yes   Intervention Offer individual and/or small group education and counseling on adjustment to heart disease, stress management and health-related lifestyle change. Teach and support self-help strategies.;Refer participants experiencing significant psychosocial distress to appropriate mental health specialists for further evaluation and treatment. When possible, include family members and significant others in education/counseling sessions.   Expected Outcomes Short Term: Participant demonstrates changes in health-related behavior, relaxation and other stress management skills, ability to obtain effective social support, and compliance with psychotropic medications if prescribed.;Long Term: Emotional wellbeing is indicated by absence of clinically significant psychosocial distress or social isolation.      Tobacco Use Initial Evaluation: History  Smoking Status  . Never Smoker  Smokeless Tobacco  . Never Used    Copy of goals given to participant.

## 2016-07-02 ENCOUNTER — Encounter: Payer: Self-pay | Admitting: *Deleted

## 2016-07-02 DIAGNOSIS — Z955 Presence of coronary angioplasty implant and graft: Secondary | ICD-10-CM

## 2016-07-02 NOTE — Progress Notes (Signed)
Cardiac Individual Treatment Plan  Patient Details  Name: Reginald Franklin MRN: [id number] Date of Birth: 1941/03/06 Referring Provider:   Flowsheet Row Cardiac Rehab from 06/30/2016 in [facility] Cardiac and Pulmonary Rehab  Referring Provider  Bartholome Bill MD      Initial Encounter Date:  Flowsheet Row Cardiac Rehab from 06/30/2016 in [facility] Cardiac and Pulmonary Rehab  Date  06/30/16  Referring Provider  Bartholome Bill MD      Visit Diagnosis: S/P coronary artery stent placement  Patient's Home Medications on Admission:  Current Outpatient Prescriptions:  .  aspirin 325 MG tablet, Take 325 mg by mouth at bedtime., Disp: , Rfl:  .  aspirin EC 325 MG EC tablet, Take 1 tablet (325 mg total) by mouth daily., Disp: 30 tablet, Rfl: 0 .  carvedilol (COREG) 6.25 MG tablet, Take 6.25 mg by mouth 2 (two) times daily with a meal., Disp: , Rfl:  .  cetirizine (ZYRTEC) 10 MG tablet, Take 10 mg by mouth at bedtime., Disp: , Rfl:  .  Cholecalciferol 10000 units TABS, Take by mouth., Disp: , Rfl:  .  clopidogrel (PLAVIX) 75 MG tablet, Take 1 tablet (75 mg total) by mouth daily with breakfast., Disp: 30 tablet, Rfl: 11 .  ezetimibe (ZETIA) 10 MG tablet, Take 10 mg by mouth at bedtime., Disp: , Rfl:  .  furosemide (LASIX) 40 MG tablet, Take 40 mg by mouth., Disp: , Rfl:  .  losartan (COZAAR) 50 MG tablet, Take 50 mg by mouth daily., Disp: , Rfl:  .  Lutein 20 MG TABS, Take by mouth., Disp: , Rfl:  .  meloxicam (MOBIC) 7.5 MG tablet, Take 7.5 mg by mouth daily., Disp: , Rfl:  .  metformin (FORTAMET) 1000 MG (OSM) 24 hr tablet, Take 1,000 mg by mouth 2 (two) times daily with a meal., Disp: , Rfl:  .  Misc Natural Products (LUTEIN 20) CAPS, Take by mouth., Disp: , Rfl:  .  Multiple Vitamin (MULTIVITAMIN) tablet, Take 1 tablet by mouth daily., Disp: , Rfl:  .  Multiple Vitamins-Minerals (VISION FORMULA EYE HEALTH PO), Take 1 tablet by mouth daily., Disp: , Rfl:  .  multivitamin-lutein (OCUVITE-LUTEIN) CAPS  capsule, Take 1 capsule by mouth daily., Disp: , Rfl:   Past Medical History: Past Medical History:  Diagnosis Date  . Anginal pain ([place])   . BPH (benign prostatic hyperplasia)   . Coronary artery disease   . Diabetes mellitus without complication [facility])    Patient takes Metformin.  . Erosive gastropathy   . Gastritis   . Hemorrhoids   . Hiatal hernia   . Hyperlipidemia   . Hypertension   . Nasal polyp   . Peripheral vascular disease ([place])   . S/P CABG x 3   . Seasonal allergies   . Stroke [facility])     Tobacco Use: History  Smoking Status  . Never Smoker  Smokeless Tobacco  . Never Used    Labs: Recent Review Flowsheet Data    There is no flowsheet data to display.       Exercise Target Goals:    Exercise Program Goal: Individual exercise prescription set with THRR, safety & activity barriers. Participant demonstrates ability to understand and report RPE using BORG scale, to self-measure pulse accurately, and to acknowledge the importance of the exercise prescription.  Exercise Prescription Goal: Starting with aerobic activity 30 plus minutes a day, 3 days per week for initial exercise prescription. Provide home exercise prescription and guidelines that participant acknowledges  understanding prior to discharge.  Activity Barriers & Risk Stratification:     Activity Barriers & Cardiac Risk Stratification - 06/30/16 1416      Activity Barriers & Cardiac Risk Stratification   Activity Barriers Deconditioning;Muscular Weakness;Other (comment)   Comments Slow getting up from sitting   Cardiac Risk Stratification High      6 Minute Walk:     6 Minute Walk    Row Name 06/30/16 1410         6 Minute Walk   Phase Initial     Distance 1000 feet     Walk Time 6 minutes     # of Rest Breaks 0     MPH 1.89     METS 2.5     RPE 9     VO2 Peak 8.76     Symptoms Yes (comment)     Comments Felt like he was dragging his right foot     Resting HR 76 bpm      Resting BP 142/80     Max Ex. HR 97 bpm     Max Ex. BP 150/76     2 Minute Post BP 140/70        Initial Exercise Prescription:     Initial Exercise Prescription - 06/30/16 1400      Date of Initial Exercise RX and Referring Provider   Date 06/30/16   Referring Provider Harold Hedge MD     Treadmill   MPH 1.7   Grade 0.5   Minutes 15   METs 2.45     NuStep   Level 1   Watts --  80-100 spm   Minutes 15   METs 2     Biostep-RELP   Level 1   Watts --  40-50 spm   Minutes 15   METs 2     Prescription Details   Frequency (times per week) 2   Duration Progress to 45 minutes of aerobic exercise without signs/symptoms of physical distress     Intensity   THRR 40-80% of Max Heartrate 104-131   Ratings of Perceived Exertion 11-15   Perceived Dyspnea 0-4     Progression   Progression Continue to progress workloads to maintain intensity without signs/symptoms of physical distress.     Resistance Training   Training Prescription Yes   Weight 2 lbs   Reps 10-12      Perform Capillary Blood Glucose checks as needed.  Exercise Prescription Changes:     Exercise Prescription Changes    Row Name 06/30/16 1400             Exercise Review   Progression -  Walk Test Results         Response to Exercise   Blood Pressure (Admit) 142/80       Blood Pressure (Exercise) 150/76       Blood Pressure (Exit) 140/70       Heart Rate (Admit) 76 bpm       Heart Rate (Exercise) 97 bpm       Heart Rate (Exit) 80 bpm       Oxygen Saturation (Admit) 96 %       Oxygen Saturation (Exit) 97 %       Rating of Perceived Exertion (Exercise) 9       Symptoms dragged right foot          Exercise Comments:     Exercise Comments    Row Name 06/30/16 1414  Exercise Comments Exercise goal is to be to walk faster and easier.          Discharge Exercise Prescription (Final Exercise Prescription Changes):     Exercise Prescription Changes - 06/30/16 1400       Exercise Review   Progression --  Walk Test Results     Response to Exercise   Blood Pressure (Admit) 142/80   Blood Pressure (Exercise) 150/76   Blood Pressure (Exit) 140/70   Heart Rate (Admit) 76 bpm   Heart Rate (Exercise) 97 bpm   Heart Rate (Exit) 80 bpm   Oxygen Saturation (Admit) 96 %   Oxygen Saturation (Exit) 97 %   Rating of Perceived Exertion (Exercise) 9   Symptoms dragged right foot      Nutrition:  Target Goals: Understanding of nutrition guidelines, daily intake of sodium 1500mg , cholesterol 200mg , calories 30% from fat and 7% or less from saturated fats, daily to have 5 or more servings of fruits and vegetables.  Biometrics:     Pre Biometrics - 06/30/16 1415      Pre Biometrics   Height 5\' 9"  (1.753 m)   Weight 157 lb 12.8 oz (71.6 kg)   Waist Circumference 35 inches   Hip Circumference 36.25 inches   Waist to Hip Ratio 0.97 %   BMI (Calculated) 23.4   Single Leg Stand 26.05 seconds       Nutrition Therapy Plan and Nutrition Goals:   Nutrition Discharge: Rate Your Plate Scores:   Nutrition Goals Re-Evaluation:   Psychosocial: Target Goals: Acknowledge presence or absence of depression, maximize coping skills, provide positive support system. Participant is able to verbalize types and ability to use techniques and skills needed for reducing stress and depression.  Initial Review & Psychosocial Screening:     Initial Psych Review & Screening - 06/30/16 1232      Initial Review   Current issues with Current Sleep Concerns     Family Dynamics   Good Support System? Yes     Barriers   Psychosocial barriers to participate in program The patient should benefit from training in stress management and relaxation.     Screening Interventions   Interventions Encouraged to exercise      Quality of Life Scores:     Quality of Life - 06/30/16 1230      Quality of Life Scores   Health/Function Pre 24.86 %   Socioeconomic Pre 26 %    Psych/Spiritual Pre 24 %   Family Pre 27 %   GLOBAL Pre 28.16 %      PHQ-9: Recent Review Flowsheet Data    Depression screen [facility] 2/9 06/30/2016   Decreased Interest 0   Down, Depressed, Hopeless 0   PHQ - 2 Score 0   Altered sleeping 1   Tired, decreased energy 1   Change in appetite 0   Feeling bad or failure about yourself  0   Trouble concentrating 0   Moving slowly or fidgety/restless 0   Suicidal thoughts 0   PHQ-9 Score 2   Difficult doing work/chores Not difficult at all      Psychosocial Evaluation and Intervention:   Psychosocial Re-Evaluation:   Vocational Rehabilitation: Provide vocational rehab assistance to qualifying candidates.   Vocational Rehab Evaluation & Intervention:     Vocational Rehab - 06/30/16 1133      Initial Vocational Rehab Evaluation & Intervention   Assessment shows need for Vocational Rehabilitation (P)  No      Education:  Education Goals: Education classes will be provided on a weekly basis, covering required topics. Participant will state understanding/return demonstration of topics presented.  Learning Barriers/Preferences:     Learning Barriers/Preferences - 06/30/16 1419      Learning Barriers/Preferences   Learning Barriers Sight   Learning Preferences None      Education Topics: General Nutrition Guidelines/Fats and Fiber: -Group instruction provided by verbal, written material, models and posters to present the general guidelines for heart healthy nutrition. Gives an explanation and review of dietary fats and fiber.   Controlling Sodium/Reading Food Labels: -Group verbal and written material supporting the discussion of sodium use in heart healthy nutrition. Review and explanation with models, verbal and written materials for utilization of the food label.   Exercise Physiology & Risk Factors: - Group verbal and written instruction with models to review the exercise physiology of the cardiovascular system and  associated critical values. Details cardiovascular disease risk factors and the goals associated with each risk factor.   Aerobic Exercise & Resistance Training: - Gives group verbal and written discussion on the health impact of inactivity. On the components of aerobic and resistive training programs and the benefits of this training and how to safely progress through these programs.   Flexibility, Balance, General Exercise Guidelines: - Provides group verbal and written instruction on the benefits of flexibility and balance training programs. Provides general exercise guidelines with specific guidelines to those with heart or lung disease. Demonstration and skill practice provided.   Stress Management: - Provides group verbal and written instruction about the health risks of elevated stress, cause of high stress, and healthy ways to reduce stress.   Depression: - Provides group verbal and written instruction on the correlation between heart/lung disease and depressed mood, treatment options, and the stigmas associated with seeking treatment.   Anatomy & Physiology of the Heart: - Group verbal and written instruction and models provide basic cardiac anatomy and physiology, with the coronary electrical and arterial systems. Review of: AMI, Angina, Valve disease, Heart Failure, Cardiac Arrhythmia, Pacemakers, and the ICD.   Cardiac Procedures: - Group verbal and written instruction and models to describe the testing methods done to diagnose heart disease. Reviews the outcomes of the test results. Describes the treatment choices: Medical Management, Angioplasty, or Coronary Bypass Surgery.   Cardiac Medications: - Group verbal and written instruction to review commonly prescribed medications for heart disease. Reviews the medication, class of the drug, and side effects. Includes the steps to properly store meds and maintain the prescription regimen.   Go Sex-Intimacy & Heart Disease, Get  SMART - Goal Setting: - Group verbal and written instruction through game format to discuss heart disease and the return to sexual intimacy. Provides group verbal and written material to discuss and apply goal setting through the application of the S.M.A.R.T. Method.   Other Matters of the Heart: - Provides group verbal, written materials and models to describe Heart Failure, Angina, Valve Disease, and Diabetes in the realm of heart disease. Includes description of the disease process and treatment options available to the cardiac patient.   Exercise & Equipment Safety: - Individual verbal instruction and demonstration of equipment use and safety with use of the equipment.   Infection Prevention: - Provides verbal and written material to individual with discussion of infection control including proper hand washing and proper equipment cleaning during exercise session.   Falls Prevention: - Provides verbal and written material to individual with discussion of falls prevention and safety.  Diabetes: - Individual verbal and written instruction to review signs/symptoms of diabetes, desired ranges of glucose level fasting, after meals and with exercise. Advice that pre and post exercise glucose checks will be done for 3 sessions at entry of program.    Knowledge Questionnaire Score:     Knowledge Questionnaire Score - 06/30/16 1230      Knowledge Questionnaire Score   Pre Score 18/28      Core Components/Risk Factors/Patient Goals at Admission:     Personal Goals and Risk Factors at Admission - 06/30/16 1231      Core Components/Risk Factors/Patient Goals on Admission    Weight Management Yes;Weight Maintenance   Intervention Weight Management: Develop a combined nutrition and exercise program designed to reach desired caloric intake, while maintaining appropriate intake of nutrient and fiber, sodium and fats, and appropriate energy expenditure required for the weight  goal.;Weight Management: Provide education and appropriate resources to help participant work on and attain dietary goals.   Expected Outcomes Weight Maintenance: Understanding of the daily nutrition guidelines, which includes 25-35% calories from fat, 7% or less cal from saturated fats, less than 200mg  cholesterol, less than 1.5gm of sodium, & 5 or more servings of fruits and vegetables daily   Sedentary Yes   Intervention Provide advice, education, support and counseling about physical activity/exercise needs.;Develop an individualized exercise prescription for aerobic and resistive training based on initial evaluation findings, risk stratification, comorbidities and participant's personal goals.   Expected Outcomes Achievement of increased cardiorespiratory fitness and enhanced flexibility, muscular endurance and strength shown through measurements of functional capacity and personal statement of participant.   Increase Strength and Stamina Yes   Intervention Provide advice, education, support and counseling about physical activity/exercise needs.;Develop an individualized exercise prescription for aerobic and resistive training based on initial evaluation findings, risk stratification, comorbidities and participant's personal goals.   Expected Outcomes Achievement of increased cardiorespiratory fitness and enhanced flexibility, muscular endurance and strength shown through measurements of functional capacity and personal statement of participant.   Diabetes Yes   Intervention Provide education about signs/symptoms and action to take for hypo/hyperglycemia.;Provide education about proper nutrition, including hydration, and aerobic/resistive exercise prescription along with prescribed medications to achieve blood glucose in normal ranges: Fasting glucose 65-99 mg/dL   Expected Outcomes Short Term: Participant verbalizes understanding of the signs/symptoms and immediate care of hyper/hypoglycemia, proper  foot care and importance of medication, aerobic/resistive exercise and nutrition plan for blood glucose control.;Long Term: Attainment of HbA1C < 7%.   Hypertension Yes   Intervention Provide education on lifestyle modifcations including regular physical activity/exercise, weight management, moderate sodium restriction and increased consumption of fresh fruit, vegetables, and low fat dairy, alcohol moderation, and smoking cessation.;Monitor prescription use compliance.   Expected Outcomes Short Term: Continued assessment and intervention until BP is < 140/35mm HG in hypertensive participants. < 130/33mm HG in hypertensive participants with diabetes, heart failure or chronic kidney disease.;Long Term: Maintenance of blood pressure at goal levels.   Lipids Yes   Intervention Provide education and support for participant on nutrition & aerobic/resistive exercise along with prescribed medications to achieve LDL 70mg , HDL >40mg .   Expected Outcomes Short Term: Participant states understanding of desired cholesterol values and is compliant with medications prescribed. Participant is following exercise prescription and nutrition guidelines.;Long Term: Cholesterol controlled with medications as prescribed, with individualized exercise RX and with personalized nutrition plan. Value goals: LDL < 70mg , HDL > 40 mg.   Stress Yes   Intervention Offer individual and/or small group education  and counseling on adjustment to heart disease, stress management and health-related lifestyle change. Teach and support self-help strategies.;Refer participants experiencing significant psychosocial distress to appropriate mental health specialists for further evaluation and treatment. When possible, include family members and significant others in education/counseling sessions.   Expected Outcomes Short Term: Participant demonstrates changes in health-related behavior, relaxation and other stress management skills, ability to obtain  effective social support, and compliance with psychotropic medications if prescribed.;Long Term: Emotional wellbeing is indicated by absence of clinically significant psychosocial distress or social isolation.      Core Components/Risk Factors/Patient Goals Review:    Core Components/Risk Factors/Patient Goals at Discharge (Final Review):    ITP Comments:     ITP Comments    Row Name 06/30/16 1234 06/30/16 1316 07/02/16 0645       ITP Comments "Reginald" Fayrene Franklin said he has always had problems with his cholestrol being high no matter what he eats. We discussed low saturated fat diet helps as a lower cholestrol diet since he already limits his eggs/week. I made him and his wife an individual appt with the  Cardiac REhab REgistered  Dieticain.  ITP Created during Medical Review. Documntation of diagnosis in [facility] 06/10/2016. 30 day review. Continue with ITP unless changes noted by Medical Director at signature of review. New to program        Comments:

## 2016-07-03 DIAGNOSIS — E785 Hyperlipidemia, unspecified: Secondary | ICD-10-CM | POA: Insufficient documentation

## 2016-07-03 DIAGNOSIS — Z48812 Encounter for surgical aftercare following surgery on the circulatory system: Secondary | ICD-10-CM | POA: Diagnosis not present

## 2016-07-03 DIAGNOSIS — I739 Peripheral vascular disease, unspecified: Secondary | ICD-10-CM | POA: Insufficient documentation

## 2016-07-03 DIAGNOSIS — Z8709 Personal history of other diseases of the respiratory system: Secondary | ICD-10-CM | POA: Insufficient documentation

## 2016-07-03 DIAGNOSIS — N400 Enlarged prostate without lower urinary tract symptoms: Secondary | ICD-10-CM | POA: Insufficient documentation

## 2016-07-03 DIAGNOSIS — Z9109 Other allergy status, other than to drugs and biological substances: Secondary | ICD-10-CM | POA: Insufficient documentation

## 2016-07-03 DIAGNOSIS — I10 Essential (primary) hypertension: Secondary | ICD-10-CM | POA: Insufficient documentation

## 2016-07-03 DIAGNOSIS — I209 Angina pectoris, unspecified: Secondary | ICD-10-CM | POA: Insufficient documentation

## 2016-07-03 DIAGNOSIS — Z955 Presence of coronary angioplasty implant and graft: Secondary | ICD-10-CM

## 2016-07-03 LAB — GLUCOSE, CAPILLARY
GLUCOSE-CAPILLARY: 176 mg/dL — AB (ref 65–99)
GLUCOSE-CAPILLARY: 90 mg/dL (ref 65–99)

## 2016-07-03 NOTE — Progress Notes (Signed)
Daily Session Note  Patient Details  Name: SLATON REASER MRN: [id number] Date of Birth: 10-18-1940 Referring Provider:   Flowsheet Row Cardiac Rehab from 06/30/2016 in [facility] Cardiac and Pulmonary Rehab  Referring Provider  Bartholome Bill MD      Encounter Date: 07/03/2016  Check In:     Session Check In - 07/03/16 0854      Check-In   Location [facility]-Cardiac & Pulmonary Rehab   Staff Present Alberteen Sam, MA, ACSM RCEP, Exercise Physiologist;Amanda Oletta Darter, [place], ACSM CEP, Exercise Physiologist   Supervising physician immediately available to respond to emergencies See telemetry face sheet for immediately available ER MD   Medication changes reported     No   Fall or balance concerns reported    No   Warm-up and Cool-down Performed on first and last piece of equipment   Resistance Training Performed Yes   VAD Patient? No     Pain Assessment   Currently in Pain? No/denies   Multiple Pain Sites No         Goals Met:  Independence with exercise equipment Exercise tolerated well No report of cardiac concerns or symptoms Strength training completed today  Goals Unmet:  Not Applicable  Comments: First full day of exercise!  Patient was oriented to gym and equipment including functions, settings, policies, and procedures.  Patient's individual exercise prescription and treatment plan were reviewed.  All starting workloads were established based on the results of the 6 minute walk test done at initial orientation visit.  The plan for exercise progression was also introduced and progression will be customized based on patient's performance and goals.    Dr. Emily Filbert is Medical Director for [place] and [facility].

## 2016-07-08 ENCOUNTER — Encounter: Payer: Medicare Other | Admitting: *Deleted

## 2016-07-08 DIAGNOSIS — Z955 Presence of coronary angioplasty implant and graft: Secondary | ICD-10-CM

## 2016-07-08 DIAGNOSIS — Z48812 Encounter for surgical aftercare following surgery on the circulatory system: Secondary | ICD-10-CM | POA: Diagnosis not present

## 2016-07-08 LAB — GLUCOSE, CAPILLARY
GLUCOSE-CAPILLARY: 173 mg/dL — AB (ref 65–99)
Glucose-Capillary: 104 mg/dL — ABNORMAL HIGH (ref 65–99)

## 2016-07-08 NOTE — Progress Notes (Signed)
Daily Session Note  Patient Details  Name: Reginald Franklin MRN: [id number] Date of Birth: Feb 02, 1941 Referring Provider:   Flowsheet Row Cardiac Rehab from 06/30/2016 in [facility] Cardiac and Pulmonary Rehab  Referring Provider  Bartholome Bill MD      Encounter Date: 07/08/2016  Check In:     Session Check In - 07/08/16 0821      Check-In   Location [facility]-Cardiac & Pulmonary Rehab   Staff Present Alberteen Sam, MA, ACSM RCEP, Exercise Physiologist;Susanne Bice, RN, BSN, CCRP;Laureen Owens Shark, BS, RRT, Respiratory Therapist   Supervising physician immediately available to respond to emergencies See telemetry face sheet for immediately available ER MD   Medication changes reported     No   Fall or balance concerns reported    No   Warm-up and Cool-down Performed on first and last piece of equipment   Resistance Training Performed Yes   VAD Patient? No     Pain Assessment   Currently in Pain? No/denies   Multiple Pain Sites No         Goals Met:  Independence with exercise equipment Exercise tolerated well No report of cardiac concerns or symptoms Strength training completed today  Goals Unmet:  Not Applicable  Comments: Pt able to follow exercise prescription today without complaint.  Will continue to monitor for progression.    Dr. Emily Filbert is Medical Director for [place] and [facility].

## 2016-07-10 DIAGNOSIS — Z955 Presence of coronary angioplasty implant and graft: Secondary | ICD-10-CM

## 2016-07-10 DIAGNOSIS — Z48812 Encounter for surgical aftercare following surgery on the circulatory system: Secondary | ICD-10-CM | POA: Diagnosis not present

## 2016-07-10 LAB — GLUCOSE, CAPILLARY
Glucose-Capillary: 160 mg/dL — ABNORMAL HIGH (ref 65–99)
Glucose-Capillary: 113 mg/dL — ABNORMAL HIGH (ref 65–99)

## 2016-07-10 NOTE — Progress Notes (Signed)
Daily Session Note  Patient Details  Name: Reginald Franklin MRN: [id number] Date of Birth: 21-Jul-1941 Referring Provider:   Flowsheet Row Cardiac Rehab from 06/30/2016 in [facility] Cardiac and Pulmonary Rehab  Referring Provider  Bartholome Bill MD      Encounter Date: 07/10/2016  Check In:     Session Check In - 07/10/16 0850      Check-In   Location [facility]-Cardiac & Pulmonary Rehab   Staff Present Heath Lark, RN, BSN, CCRP;Jessica Houghton, MA, ACSM RCEP, Exercise Physiologist;Sharonne Ricketts Oletta Darter, BA, ACSM CEP, Exercise Physiologist   Supervising physician immediately available to respond to emergencies See telemetry face sheet for immediately available ER MD   Medication changes reported     No   Fall or balance concerns reported    No   Warm-up and Cool-down Performed on first and last piece of equipment   Resistance Training Performed Yes   VAD Patient? No     Pain Assessment   Currently in Pain? No/denies   Multiple Pain Sites No         Goals Met:  Independence with exercise equipment Exercise tolerated well No report of cardiac concerns or symptoms Strength training completed today  Goals Unmet:  Not Applicable  Comments: Pt able to follow exercise prescription today without complaint.  Will continue to monitor for progression.    Dr. Emily Filbert is Medical Director for [place] and [facility].

## 2016-07-15 ENCOUNTER — Encounter: Payer: Medicare Other | Admitting: *Deleted

## 2016-07-15 DIAGNOSIS — Z955 Presence of coronary angioplasty implant and graft: Secondary | ICD-10-CM

## 2016-07-15 DIAGNOSIS — Z48812 Encounter for surgical aftercare following surgery on the circulatory system: Secondary | ICD-10-CM | POA: Diagnosis not present

## 2016-07-15 NOTE — Progress Notes (Signed)
Daily Session Note  Patient Details  Name: Reginald Franklin MRN: [id number] Date of Birth: 1940-11-16 Referring Provider:   Flowsheet Row Cardiac Rehab from 06/30/2016 in [facility] Cardiac and Pulmonary Rehab  Referring Provider  Bartholome Bill MD      Encounter Date: 07/15/2016  Check In:     Session Check In - 07/15/16 0843      Check-In   Location [facility]-Cardiac & Pulmonary Rehab   Staff Present Alberteen Sam, MA, ACSM RCEP, Exercise Physiologist;Susanne Bice, RN, BSN, CCRP;Laureen Owens Shark, BS, RRT, Respiratory Therapist;Other   Supervising physician immediately available to respond to emergencies See telemetry face sheet for immediately available ER MD   Medication changes reported     No   Fall or balance concerns reported    No   Warm-up and Cool-down Performed on first and last piece of equipment   Resistance Training Performed Yes   VAD Patient? No     Pain Assessment   Currently in Pain? No/denies   Multiple Pain Sites No         Goals Met:  Independence with exercise equipment Exercise tolerated well No report of cardiac concerns or symptoms Strength training completed today  Goals Unmet:  Not Applicable  Comments: Pt able to follow exercise prescription today without complaint.  Will continue to monitor for progression.    Dr. Emily Filbert is Medical Director for [place] and [facility].

## 2016-07-15 NOTE — Progress Notes (Signed)
Daily Session Note  Patient Details  Name: Reginald Franklin MRN: [id number] Date of Birth: 02/02/1941 Referring Provider:   Flowsheet Row Cardiac Rehab from 06/30/2016 in [facility] Cardiac and Pulmonary Rehab  Referring Provider  Bartholome Bill MD      Encounter Date: 07/15/2016  Check In:     Session Check In - 07/15/16 0843      Check-In   Location [facility]-Cardiac & Pulmonary Rehab   Staff Present Alberteen Sam, MA, ACSM RCEP, Exercise Physiologist;Tomothy Eddins, RN, BSN, CCRP;Laureen Owens Shark, BS, RRT, Respiratory Therapist;Other   Supervising physician immediately available to respond to emergencies See telemetry face sheet for immediately available ER MD   Medication changes reported     No   Fall or balance concerns reported    No   Warm-up and Cool-down Performed on first and last piece of equipment   Resistance Training Performed Yes   VAD Patient? No     Pain Assessment   Currently in Pain? No/denies   Multiple Pain Sites No         Goals Met:  Exercise tolerated well Personal goals reviewed No report of cardiac concerns or symptoms Strength training completed today  Goals Unmet:  Not Applicable  Comments: Doing well with exercise prescription progression.    Dr. Emily Filbert is Medical Director for [place] and [facility].

## 2016-07-17 ENCOUNTER — Encounter: Payer: Medicare Other | Admitting: *Deleted

## 2016-07-17 DIAGNOSIS — Z48812 Encounter for surgical aftercare following surgery on the circulatory system: Secondary | ICD-10-CM | POA: Diagnosis not present

## 2016-07-17 DIAGNOSIS — Z955 Presence of coronary angioplasty implant and graft: Secondary | ICD-10-CM

## 2016-07-17 NOTE — Progress Notes (Signed)
Daily Session Note  Patient Details  Name: NAZAIAH NAVARRETE MRN: [id number] Date of Birth: 07/18/1941 Referring Provider:   Flowsheet Row Cardiac Rehab from 06/30/2016 in [facility] Cardiac and Pulmonary Rehab  Referring Provider  Bartholome Bill MD      Encounter Date: 07/17/2016  Check In:     Session Check In - 07/17/16 0924      Check-In   Location [facility]-Cardiac & Pulmonary Rehab   Staff Present Gerlene Burdock, RN, BSN;Jessica Luan Pulling, MA, ACSM RCEP, Exercise Physiologist;Amanda Oletta Darter, BA, ACSM CEP, Exercise Physiologist   Supervising physician immediately available to respond to emergencies See telemetry face sheet for immediately available ER MD   Fall or balance concerns reported    No   Warm-up and Cool-down Performed on first and last piece of equipment   Resistance Training Performed Yes   VAD Patient? No     Pain Assessment   Currently in Pain? No/denies         Goals Met:  Proper associated with RPD/PD & O2 Sat Exercise tolerated well  Goals Unmet:  Not Applicable  Comments:    Dr. Emily Filbert is Medical Director for [place] and [facility].

## 2016-07-24 ENCOUNTER — Encounter: Payer: Medicare Other | Attending: Cardiology

## 2016-07-24 DIAGNOSIS — I10 Essential (primary) hypertension: Secondary | ICD-10-CM | POA: Insufficient documentation

## 2016-07-24 DIAGNOSIS — E785 Hyperlipidemia, unspecified: Secondary | ICD-10-CM | POA: Insufficient documentation

## 2016-07-24 DIAGNOSIS — I2510 Atherosclerotic heart disease of native coronary artery without angina pectoris: Secondary | ICD-10-CM | POA: Insufficient documentation

## 2016-07-24 DIAGNOSIS — Z9889 Other specified postprocedural states: Secondary | ICD-10-CM | POA: Insufficient documentation

## 2016-07-24 DIAGNOSIS — Z8673 Personal history of transient ischemic attack (TIA), and cerebral infarction without residual deficits: Secondary | ICD-10-CM | POA: Insufficient documentation

## 2016-07-24 DIAGNOSIS — Z48812 Encounter for surgical aftercare following surgery on the circulatory system: Secondary | ICD-10-CM | POA: Insufficient documentation

## 2016-07-24 DIAGNOSIS — Z955 Presence of coronary angioplasty implant and graft: Secondary | ICD-10-CM | POA: Insufficient documentation

## 2016-07-24 DIAGNOSIS — I739 Peripheral vascular disease, unspecified: Secondary | ICD-10-CM | POA: Insufficient documentation

## 2016-07-24 DIAGNOSIS — Z951 Presence of aortocoronary bypass graft: Secondary | ICD-10-CM | POA: Insufficient documentation

## 2016-07-29 ENCOUNTER — Encounter: Payer: Medicare Other | Admitting: *Deleted

## 2016-07-29 DIAGNOSIS — Z951 Presence of aortocoronary bypass graft: Secondary | ICD-10-CM | POA: Diagnosis not present

## 2016-07-29 DIAGNOSIS — I2510 Atherosclerotic heart disease of native coronary artery without angina pectoris: Secondary | ICD-10-CM | POA: Diagnosis not present

## 2016-07-29 DIAGNOSIS — I739 Peripheral vascular disease, unspecified: Secondary | ICD-10-CM | POA: Diagnosis not present

## 2016-07-29 DIAGNOSIS — E785 Hyperlipidemia, unspecified: Secondary | ICD-10-CM | POA: Diagnosis not present

## 2016-07-29 DIAGNOSIS — Z48812 Encounter for surgical aftercare following surgery on the circulatory system: Secondary | ICD-10-CM | POA: Diagnosis present

## 2016-07-29 DIAGNOSIS — I10 Essential (primary) hypertension: Secondary | ICD-10-CM | POA: Diagnosis not present

## 2016-07-29 DIAGNOSIS — Z9889 Other specified postprocedural states: Secondary | ICD-10-CM | POA: Diagnosis present

## 2016-07-29 DIAGNOSIS — Z8673 Personal history of transient ischemic attack (TIA), and cerebral infarction without residual deficits: Secondary | ICD-10-CM | POA: Diagnosis not present

## 2016-07-29 DIAGNOSIS — Z955 Presence of coronary angioplasty implant and graft: Secondary | ICD-10-CM | POA: Diagnosis present

## 2016-07-29 NOTE — Progress Notes (Signed)
Daily Session Note  Patient Details  Name: Reginald Franklin MRN: [id number] Date of Birth: 1941-05-17 Referring Provider:   Flowsheet Row Cardiac Rehab from 06/30/2016 in [facility] Cardiac and Pulmonary Rehab  Referring Provider  Bartholome Bill MD      Encounter Date: 07/29/2016  Check In:     Session Check In - 07/29/16 0819      Check-In   Location [facility]-Cardiac & Pulmonary Rehab   Staff Present Alberteen Sam, MA, ACSM RCEP, Exercise Physiologist;Susanne Bice, RN, BSN, CCRP;Laureen Owens Shark, BS, RRT, Respiratory Therapist   Supervising physician immediately available to respond to emergencies See telemetry face sheet for immediately available ER MD   Medication changes reported     No   Fall or balance concerns reported    No   Warm-up and Cool-down Performed on first and last piece of equipment   Resistance Training Performed Yes   VAD Patient? No     Pain Assessment   Currently in Pain? No/denies   Multiple Pain Sites No         Goals Met:  Independence with exercise equipment Exercise tolerated well No report of cardiac concerns or symptoms Strength training completed today  Goals Unmet:  Not Applicable  Comments: Pt able to follow exercise prescription today without complaint.  Will continue to monitor for progression.    Dr. Emily Filbert is Medical Director for [place] and [facility].

## 2016-07-30 ENCOUNTER — Encounter: Payer: Self-pay | Admitting: *Deleted

## 2016-07-30 DIAGNOSIS — Z955 Presence of coronary angioplasty implant and graft: Secondary | ICD-10-CM

## 2016-07-30 NOTE — Progress Notes (Signed)
Cardiac Individual Treatment Plan  Patient Details  Name: Reginald Franklin MRN: [id number] Date of Birth: 1941/03/06 Referring Provider:   Flowsheet Row Cardiac Rehab from 06/30/2016 in [facility] Cardiac and Pulmonary Rehab  Referring Provider  Bartholome Bill MD      Initial Encounter Date:  Flowsheet Row Cardiac Rehab from 06/30/2016 in [facility] Cardiac and Pulmonary Rehab  Date  06/30/16  Referring Provider  Bartholome Bill MD      Visit Diagnosis: S/P coronary artery stent placement  Patient's Home Medications on Admission:  Current Outpatient Prescriptions:  .  aspirin 325 MG tablet, Take 325 mg by mouth at bedtime., Disp: , Rfl:  .  aspirin EC 325 MG EC tablet, Take 1 tablet (325 mg total) by mouth daily., Disp: 30 tablet, Rfl: 0 .  carvedilol (COREG) 6.25 MG tablet, Take 6.25 mg by mouth 2 (two) times daily with a meal., Disp: , Rfl:  .  cetirizine (ZYRTEC) 10 MG tablet, Take 10 mg by mouth at bedtime., Disp: , Rfl:  .  Cholecalciferol 10000 units TABS, Take by mouth., Disp: , Rfl:  .  clopidogrel (PLAVIX) 75 MG tablet, Take 1 tablet (75 mg total) by mouth daily with breakfast., Disp: 30 tablet, Rfl: 11 .  ezetimibe (ZETIA) 10 MG tablet, Take 10 mg by mouth at bedtime., Disp: , Rfl:  .  furosemide (LASIX) 40 MG tablet, Take 40 mg by mouth., Disp: , Rfl:  .  losartan (COZAAR) 50 MG tablet, Take 50 mg by mouth daily., Disp: , Rfl:  .  Lutein 20 MG TABS, Take by mouth., Disp: , Rfl:  .  meloxicam (MOBIC) 7.5 MG tablet, Take 7.5 mg by mouth daily., Disp: , Rfl:  .  metformin (FORTAMET) 1000 MG (OSM) 24 hr tablet, Take 1,000 mg by mouth 2 (two) times daily with a meal., Disp: , Rfl:  .  Misc Natural Products (LUTEIN 20) CAPS, Take by mouth., Disp: , Rfl:  .  Multiple Vitamin (MULTIVITAMIN) tablet, Take 1 tablet by mouth daily., Disp: , Rfl:  .  Multiple Vitamins-Minerals (VISION FORMULA EYE HEALTH PO), Take 1 tablet by mouth daily., Disp: , Rfl:  .  multivitamin-lutein (OCUVITE-LUTEIN) CAPS  capsule, Take 1 capsule by mouth daily., Disp: , Rfl:   Past Medical History: Past Medical History:  Diagnosis Date  . Anginal pain ([place])   . BPH (benign prostatic hyperplasia)   . Coronary artery disease   . Diabetes mellitus without complication [facility])    Patient takes Metformin.  . Erosive gastropathy   . Gastritis   . Hemorrhoids   . Hiatal hernia   . Hyperlipidemia   . Hypertension   . Nasal polyp   . Peripheral vascular disease ([place])   . S/P CABG x 3   . Seasonal allergies   . Stroke [facility])     Tobacco Use: History  Smoking Status  . Never Smoker  Smokeless Tobacco  . Never Used    Labs: Recent Review Flowsheet Data    There is no flowsheet data to display.       Exercise Target Goals:    Exercise Program Goal: Individual exercise prescription set with THRR, safety & activity barriers. Participant demonstrates ability to understand and report RPE using BORG scale, to self-measure pulse accurately, and to acknowledge the importance of the exercise prescription.  Exercise Prescription Goal: Starting with aerobic activity 30 plus minutes a day, 3 days per week for initial exercise prescription. Provide home exercise prescription and guidelines that participant acknowledges  understanding prior to discharge.  Activity Barriers & Risk Stratification:     Activity Barriers & Cardiac Risk Stratification - 06/30/16 1416      Activity Barriers & Cardiac Risk Stratification   Activity Barriers Deconditioning;Muscular Weakness;Other (comment)   Comments Slow getting up from sitting   Cardiac Risk Stratification High      6 Minute Walk:     6 Minute Walk    Row Name 06/30/16 1410         6 Minute Walk   Phase Initial     Distance 1000 feet     Walk Time 6 minutes     # of Rest Breaks 0     MPH 1.89     METS 2.5     RPE 9     VO2 Peak 8.76     Symptoms Yes (comment)     Comments Felt like he was dragging his right foot     Resting HR 76 bpm      Resting BP 142/80     Max Ex. HR 97 bpm     Max Ex. BP 150/76     2 Minute Post BP 140/70        Initial Exercise Prescription:     Initial Exercise Prescription - 06/30/16 1400      Date of Initial Exercise RX and Referring Provider   Date 06/30/16   Referring Provider Bartholome Bill MD     Treadmill   MPH 1.7   Grade 0.5   Minutes 15   METs 2.45     NuStep   Level 1   Watts --  80-100 spm   Minutes 15   METs 2     Biostep-RELP   Level 1   Watts --  40-50 spm   Minutes 15   METs 2     Prescription Details   Frequency (times per week) 2   Duration Progress to 45 minutes of aerobic exercise without signs/symptoms of physical distress     Intensity   THRR 40-80% of Max Heartrate 104-131   Ratings of Perceived Exertion 11-15   Perceived Dyspnea 0-4     Progression   Progression Continue to progress workloads to maintain intensity without signs/symptoms of physical distress.     Resistance Training   Training Prescription Yes   Weight 2 lbs   Reps 10-12      Perform Capillary Blood Glucose checks as needed.  Exercise Prescription Changes:     Exercise Prescription Changes    Row Name 06/30/16 1400 07/08/16 1400 07/23/16 0900         Exercise Review   Progression -  Walk Test Results Yes Yes       Response to Exercise   Blood Pressure (Admit) 142/80 132/70 132/60     Blood Pressure (Exercise) 150/76 136/64 138/72     Blood Pressure (Exit) 140/70 130/74 122/80     Heart Rate (Admit) 76 bpm 81 bpm 65 bpm     Heart Rate (Exercise) 97 bpm 91 bpm 87 bpm     Heart Rate (Exit) 80 bpm 78 bpm 62 bpm     Oxygen Saturation (Admit) 96 %  -  -     Oxygen Saturation (Exit) 97 %  -  -     Rating of Perceived Exertion (Exercise) '9 12 13     '$ Symptoms dragged right foot none none     Duration  - Progress to 45 minutes of aerobic  exercise without signs/symptoms of physical distress Progress to 45 minutes of aerobic exercise without signs/symptoms of physical  distress     Intensity  - THRR unchanged THRR unchanged       Progression   Progression  - Continue to progress workloads to maintain intensity without signs/symptoms of physical distress. Continue to progress workloads to maintain intensity without signs/symptoms of physical distress.     Average METs  - 2.12 2.12       Resistance Training   Training Prescription  - Yes Yes     Weight  - 3 lbs 3 lbs     Reps  - 10-12 10-12       Interval Training   Interval Training  - No No       Treadmill   MPH  - 1.4 1.4     Grade  - 0.5 0.5     Minutes  - 15 15     METs  - 2.27 2.27       NuStep   Level  - 1 3     Minutes  - 15 15     METs  - 2.1 2.1       Biostep-RELP   Level  - 1 1     Minutes  - 15 15     METs  - 2 2        Exercise Comments:     Exercise Comments    Row Name 06/30/16 1414 07/03/16 1001 07/08/16 1453 07/23/16 0943     Exercise Comments Exercise goal is to be to walk faster and easier. First full day of exercise!  Patient was oriented to gym and equipment including functions, settings, policies, and procedures.  Patient's individual exercise prescription and treatment plan were reviewed.  All starting workloads were established based on the results of the 6 minute walk test done at initial orientation visit.  The plan for exercise progression was also introduced and progression will be customized based on patient's performance and goals. Reginald Franklin is off to a good start with exercise.  We will continue to monitor his progression. Reginald Franklin is doing well with exercise.  [place]  We will continue to monitor his progression.       Discharge Exercise Prescription (Final Exercise Prescription Changes):     Exercise Prescription Changes - 07/23/16 0900      Exercise Review   Progression Yes     Response to Exercise   Blood Pressure (Admit) 132/60   Blood Pressure (Exercise) 138/72   Blood Pressure (Exit) 122/80   Heart Rate (Admit) 65  bpm   Heart Rate (Exercise) 87 bpm   Heart Rate (Exit) 62 bpm   Rating of Perceived Exertion (Exercise) 13   Symptoms none   Duration Progress to 45 minutes of aerobic exercise without signs/symptoms of physical distress   Intensity THRR unchanged     Progression   Progression Continue to progress workloads to maintain intensity without signs/symptoms of physical distress.   Average METs 2.12     Resistance Training   Training Prescription Yes   Weight 3 lbs   Reps 10-12     Interval Training   Interval Training No     Treadmill   MPH 1.4   Grade 0.5   Minutes 15   METs 2.27     NuStep   Level 3   Minutes 15   METs 2.1     Biostep-RELP  Level 1   Minutes 15   METs 2      Nutrition:  Target Goals: Understanding of nutrition guidelines, daily intake of sodium '1500mg'$ , cholesterol '200mg'$ , calories 30% from fat and 7% or less from saturated fats, daily to have 5 or more servings of fruits and vegetables.  Biometrics:     Pre Biometrics - 06/30/16 1415      Pre Biometrics   Height '5\' 9"'$  (1.753 m)   Weight 157 lb 12.8 oz (71.6 kg)   Waist Circumference 35 inches   Hip Circumference 36.25 inches   Waist to Hip Ratio 0.97 %   BMI (Calculated) 23.4   Single Leg Stand 26.05 seconds       Nutrition Therapy Plan and Nutrition Goals:     Nutrition Therapy & Goals - 07/10/16 1145      Nutrition Therapy   Diet --  Reginald Franklin did not show for RD appointment      Nutrition Discharge: Rate Your Plate Scores:     Nutrition Assessments - 06/30/16 1455      Rate Your Plate Scores   Pre Score 63   Pre Score % 70 %      Nutrition Goals Re-Evaluation:   Psychosocial: Target Goals: Acknowledge presence or absence of depression, maximize coping skills, provide positive support system. Participant is able to verbalize types and ability to use techniques and skills needed for reducing stress and depression.  Initial Review & Psychosocial Screening:      Initial Psych Review & Screening - 06/30/16 1232      Initial Review   Current issues with Current Sleep Concerns     Family Dynamics   Good Support System? Yes     Barriers   Psychosocial barriers to participate in program The patient should benefit from training in stress management and relaxation.     Screening Interventions   Interventions Encouraged to exercise      Quality of Life Scores:     Quality of Life - 06/30/16 1230      Quality of Life Scores   Health/Function Pre 24.86 %   Socioeconomic Pre 26 %   Psych/Spiritual Pre 24 %   Family Pre 27 %   GLOBAL Pre 28.16 %      PHQ-9: Recent Review Flowsheet Data    Depression screen [facility] 2/9 06/30/2016   Decreased Interest 0   Down, Depressed, Hopeless 0   PHQ - 2 Score 0   Altered sleeping 1   Tired, decreased energy 1   Change in appetite 0   Feeling bad or failure about yourself  0   Trouble concentrating 0   Moving slowly or fidgety/restless 0   Suicidal thoughts 0   PHQ-9 Score 2   Difficult doing work/chores Not difficult at all      Psychosocial Evaluation and Intervention:     Psychosocial Evaluation - 07/08/16 0939      Psychosocial Evaluation & Interventions   Interventions Encouraged to exercise with the program and follow exercise prescription   Comments Counselor met with Reginald Franklin today for initial psychosocial evaluation.  Reginald Franklin is a 75 year old who had a stent put in recently and reports having triple bypass over 22 years ago.  He has a spouse of 93 years considers that his support system.  He states he sleeps well and has a good appetite.  He denies a history of anxiety or depression or any current symptoms and states he is typically  in a positive mood most of the time.  he reports minimal stress in his life other than his health and that of his spouse.  Reginald Franklin has goals to increase his energy and be able to walk better.  He does not enjoy exercising and reports he has gym equipment at home  "but probably won't use it."  Counselor encouraged Reginald Franklin to consistently exercise and get into a habit of this being part of his heart healthy program.        Psychosocial Re-Evaluation:   Vocational Rehabilitation: Provide vocational rehab assistance to qualifying candidates.   Vocational Rehab Evaluation & Intervention:     Vocational Rehab - 06/30/16 1133      Initial Vocational Rehab Evaluation & Intervention   Assessment shows need for Vocational Rehabilitation (P)  No      Education: Education Goals: Education classes will be provided on a weekly basis, covering required topics. Participant will state understanding/return demonstration of topics presented.  Learning Barriers/Preferences:     Learning Barriers/Preferences - 06/30/16 1419      Learning Barriers/Preferences   Learning Barriers Sight   Learning Preferences None      Education Topics: General Nutrition Guidelines/Fats and Fiber: -Group instruction provided by verbal, written material, models and posters to present the general guidelines for heart healthy nutrition. Gives an explanation and review of dietary fats and fiber.   Controlling Sodium/Reading Food Labels: -Group verbal and written material supporting the discussion of sodium use in heart healthy nutrition. Review and explanation with models, verbal and written materials for utilization of the food label.   Exercise Physiology & Risk Factors: - Group verbal and written instruction with models to review the exercise physiology of the cardiovascular system and associated critical values. Details cardiovascular disease risk factors and the goals associated with each risk factor.   Aerobic Exercise & Resistance Training: - Gives group verbal and written discussion on the health impact of inactivity. On the components of aerobic and resistive training programs and the benefits of this training and how to safely progress through these  programs.   Flexibility, Balance, General Exercise Guidelines: - Provides group verbal and written instruction on the benefits of flexibility and balance training programs. Provides general exercise guidelines with specific guidelines to those with heart or lung disease. Demonstration and skill practice provided.   Stress Management: - Provides group verbal and written instruction about the health risks of elevated stress, cause of high stress, and healthy ways to reduce stress. Flowsheet Row Cardiac Rehab from 07/29/2016 in [facility] Cardiac and Pulmonary Rehab  Date  07/10/16  Educator  SB  Instruction Review Code  2- meets goals/outcomes      Depression: - Provides group verbal and written instruction on the correlation between heart/lung disease and depressed mood, treatment options, and the stigmas associated with seeking treatment.   Anatomy & Physiology of the Heart: - Group verbal and written instruction and models provide basic cardiac anatomy and physiology, with the coronary electrical and arterial systems. Review of: AMI, Angina, Valve disease, Heart Failure, Cardiac Arrhythmia, Pacemakers, and the ICD. Flowsheet Row Cardiac Rehab from 07/29/2016 in [facility] Cardiac and Pulmonary Rehab  Date  07/08/16  Educator  SB  Instruction Review Code  2- meets goals/outcomes      Cardiac Procedures: - Group verbal and written instruction and models to describe the testing methods done to diagnose heart disease. Reviews the outcomes of the test results. Describes the treatment choices: Medical Management, Angioplasty, or Coronary  Bypass Surgery. Flowsheet Row Cardiac Rehab from 07/29/2016 in [facility] Cardiac and Pulmonary Rehab  Date  07/15/16  Educator  SB  Instruction Review Code  2- meets goals/outcomes      Cardiac Medications: - Group verbal and written instruction to review commonly prescribed medications for heart disease. Reviews the medication, class of the drug, and side effects.  Includes the steps to properly store meds and maintain the prescription regimen. Flowsheet Row Cardiac Rehab from 07/29/2016 in [facility] Cardiac and Pulmonary Rehab  Date  07/17/16  Educator  C. EnterkinRN  Instruction Review Code  2- meets goals/outcomes      Go Sex-Intimacy & Heart Disease, Get SMART - Goal Setting: - Group verbal and written instruction through game format to discuss heart disease and the return to sexual intimacy. Provides group verbal and written material to discuss and apply goal setting through the application of the S.M.A.R.T. Method. Flowsheet Row Cardiac Rehab from 07/29/2016 in [facility] Cardiac and Pulmonary Rehab  Date  07/15/16  Educator  SB  Instruction Review Code  2- meets goals/outcomes      Other Matters of the Heart: - Provides group verbal, written materials and models to describe Heart Failure, Angina, Valve Disease, and Diabetes in the realm of heart disease. Includes description of the disease process and treatment options available to the cardiac patient. Flowsheet Row Cardiac Rehab from 07/29/2016 in [facility] Cardiac and Pulmonary Rehab  Date  07/08/16  Educator  SB  Instruction Review Code  2- meets goals/outcomes      Exercise & Equipment Safety: - Individual verbal instruction and demonstration of equipment use and safety with use of the equipment.   Infection Prevention: - Provides verbal and written material to individual with discussion of infection control including proper hand washing and proper equipment cleaning during exercise session.   Falls Prevention: - Provides verbal and written material to individual with discussion of falls prevention and safety.   Diabetes: - Individual verbal and written instruction to review signs/symptoms of diabetes, desired ranges of glucose level fasting, after meals and with exercise. Advice that pre and post exercise glucose checks will be done for 3 sessions at entry of program.    Knowledge  Questionnaire Score:     Knowledge Questionnaire Score - 06/30/16 1230      Knowledge Questionnaire Score   Pre Score 18/28      Core Components/Risk Factors/Patient Goals at Admission:     Personal Goals and Risk Factors at Admission - 06/30/16 1231      Core Components/Risk Factors/Patient Goals on Admission    Weight Management Yes;Weight Maintenance   Intervention Weight Management: Develop a combined nutrition and exercise program designed to reach desired caloric intake, while maintaining appropriate intake of nutrient and fiber, sodium and fats, and appropriate energy expenditure required for the weight goal.;Weight Management: Provide education and appropriate resources to help participant work on and attain dietary goals.   Expected Outcomes Weight Maintenance: Understanding of the daily nutrition guidelines, which includes 25-35% calories from fat, 7% or less cal from saturated fats, less than '200mg'$  cholesterol, less than 1.5gm of sodium, & 5 or more servings of fruits and vegetables daily   Sedentary Yes   Intervention Provide advice, education, support and counseling about physical activity/exercise needs.;Develop an individualized exercise prescription for aerobic and resistive training based on initial evaluation findings, risk stratification, comorbidities and participant's personal goals.   Expected Outcomes Achievement of increased cardiorespiratory fitness and enhanced flexibility, muscular endurance and strength shown through  measurements of functional capacity and personal statement of participant.   Increase Strength and Stamina Yes   Intervention Provide advice, education, support and counseling about physical activity/exercise needs.;Develop an individualized exercise prescription for aerobic and resistive training based on initial evaluation findings, risk stratification, comorbidities and participant's personal goals.   Expected Outcomes Achievement of increased  cardiorespiratory fitness and enhanced flexibility, muscular endurance and strength shown through measurements of functional capacity and personal statement of participant.   Diabetes Yes   Intervention Provide education about signs/symptoms and action to take for hypo/hyperglycemia.;Provide education about proper nutrition, including hydration, and aerobic/resistive exercise prescription along with prescribed medications to achieve blood glucose in normal ranges: Fasting glucose 65-99 mg/dL   Expected Outcomes Short Term: Participant verbalizes understanding of the signs/symptoms and immediate care of hyper/hypoglycemia, proper foot care and importance of medication, aerobic/resistive exercise and nutrition plan for blood glucose control.;Long Term: Attainment of HbA1C < 7%.   Hypertension Yes   Intervention Provide education on lifestyle modifcations including regular physical activity/exercise, weight management, moderate sodium restriction and increased consumption of fresh fruit, vegetables, and low fat dairy, alcohol moderation, and smoking cessation.;Monitor prescription use compliance.   Expected Outcomes Short Term: Continued assessment and intervention until BP is < 140/22m HG in hypertensive participants. < 130/862mHG in hypertensive participants with diabetes, heart failure or chronic kidney disease.;Long Term: Maintenance of blood pressure at goal levels.   Lipids Yes   Intervention Provide education and support for participant on nutrition & aerobic/resistive exercise along with prescribed medications to achieve LDL '70mg'$ , HDL >'40mg'$ .   Expected Outcomes Short Term: Participant states understanding of desired cholesterol values and is compliant with medications prescribed. Participant is following exercise prescription and nutrition guidelines.;Long Term: Cholesterol controlled with medications as prescribed, with individualized exercise RX and with personalized nutrition plan. Value goals:  LDL < '70mg'$ , HDL > 40 mg.   Stress Yes   Intervention Offer individual and/or small group education and counseling on adjustment to heart disease, stress management and health-related lifestyle change. Teach and support self-help strategies.;Refer participants experiencing significant psychosocial distress to appropriate mental health specialists for further evaluation and treatment. When possible, include family members and significant others in education/counseling sessions.   Expected Outcomes Short Term: Participant demonstrates changes in health-related behavior, relaxation and other stress management skills, ability to obtain effective social support, and compliance with psychotropic medications if prescribed.;Long Term: Emotional wellbeing is indicated by absence of clinically significant psychosocial distress or social isolation.      Core Components/Risk Factors/Patient Goals Review:      Goals and Risk Factor Review    Row Name 07/15/16 1024             Core Components/Risk Factors/Patient Goals Review   Personal Goals Review Weight Management/Obesity;Sedentary;Increase Strength and Stamina;Diabetes;Lipids;Hypertension       Review Reginald Franklin off to a good start with rehab.  He is starting to feel a little bit better and gaining some strength back.  He is still struggling to get up off the floor, but he says that it has gotten better.  He is not exercising at home yet, but we will talk about this soon.  His weight is holding steady for now.  He has not been checking his blood sugars as he is currently out of lancets.  His blood pressures have been good here, but he does not check them at home.  He is intolerant to statins and has not had a recent lipid check.  He feels  that his stress levels have started to go down.       Expected Outcomes Reginald Franklin will continue to come to exercise and education classes to work on risk factor modifications.          Core Components/Risk Factors/Patient Goals  at Discharge (Final Review):      Goals and Risk Factor Review - 07/15/16 1024      Core Components/Risk Factors/Patient Goals Review   Personal Goals Review Weight Management/Obesity;Sedentary;Increase Strength and Stamina;Diabetes;Lipids;Hypertension   Review Reginald Franklin is off to a good start with rehab.  He is starting to feel a little bit better and gaining some strength back.  He is still struggling to get up off the floor, but he says that it has gotten better.  He is not exercising at home yet, but we will talk about this soon.  His weight is holding steady for now.  He has not been checking his blood sugars as he is currently out of lancets.  His blood pressures have been good here, but he does not check them at home.  He is intolerant to statins and has not had a recent lipid check.  He feels that his stress levels have started to go down.   Expected Outcomes Reginald Franklin will continue to come to exercise and education classes to work on risk factor modifications.      ITP Comments:     ITP Comments    Row Name 06/30/16 1234 06/30/16 1316 07/02/16 0645 07/30/16 0657     ITP Comments "[name]" Jeneen Rinks said he has always had problems with his cholestrol being high no matter what he eats. We discussed low saturated fat diet helps as a lower cholestrol diet since he already limits his eggs/week. I made him and his wife an individual appt with the  Cardiac REhab REgistered  [place].  ITP Created during Medical Review. Documntation of diagnosis in [facility] 06/10/2016. 30 day review. Continue with ITP unless changes noted by Medical Director at signature of review. New to program 30 day review completed for Medical Director physician review and signature. Continue ITP unless changes made by physician.       Comments:

## 2016-07-31 DIAGNOSIS — Z48812 Encounter for surgical aftercare following surgery on the circulatory system: Secondary | ICD-10-CM | POA: Diagnosis not present

## 2016-07-31 DIAGNOSIS — Z955 Presence of coronary angioplasty implant and graft: Secondary | ICD-10-CM

## 2016-07-31 NOTE — Progress Notes (Signed)
Daily Session Note  Patient Details  Name: Reginald Franklin MRN: [id number] Date of Birth: 1940/11/22 Referring Provider:   Flowsheet Row Cardiac Rehab from 06/30/2016 in [facility] Cardiac and Pulmonary Rehab  Referring Provider  Bartholome Bill MD      Encounter Date: 07/31/2016  Check In:     Session Check In - 07/31/16 0839      Check-In   Location [facility]-Cardiac & Pulmonary Rehab   Staff Present Gerlene Burdock, RN, BSN;Jessica Luan Pulling, MA, ACSM RCEP, Exercise Physiologist;Amaro Mangold Oletta Darter, BA, ACSM CEP, Exercise Physiologist   Supervising physician immediately available to respond to emergencies See telemetry face sheet for immediately available ER MD   Medication changes reported     No   Fall or balance concerns reported    No   Warm-up and Cool-down Performed on first and last piece of equipment   Resistance Training Performed Yes   VAD Patient? No     Pain Assessment   Currently in Pain? No/denies   Multiple Pain Sites No         Goals Met:  Independence with exercise equipment Exercise tolerated well No report of cardiac concerns or symptoms Strength training completed today  Goals Unmet:  Not Applicable  Comments: Pt able to follow exercise prescription today without complaint.  Will continue to monitor for progression.    Dr. Emily Filbert is Medical Director for [place] [facility].

## 2016-08-05 ENCOUNTER — Encounter: Payer: Medicare Other | Admitting: *Deleted

## 2016-08-05 DIAGNOSIS — Z955 Presence of coronary angioplasty implant and graft: Secondary | ICD-10-CM

## 2016-08-05 DIAGNOSIS — Z48812 Encounter for surgical aftercare following surgery on the circulatory system: Secondary | ICD-10-CM | POA: Diagnosis not present

## 2016-08-05 NOTE — Progress Notes (Addendum)
Daily Session Note  Patient Details  Name: Reginald Franklin MRN: [id number] Date of Birth: October 10, 1940 Referring Provider:   Flowsheet Row Cardiac Rehab from 06/30/2016 in [facility] Cardiac and Pulmonary Rehab  Referring Provider  Bartholome Bill MD      Encounter Date: 08/05/2016  Check In:     Session Check In - 08/05/16 0820      Check-In   Location [facility]-Cardiac & Pulmonary Rehab   Staff Present Alberteen Sam, MA, ACSM RCEP, Exercise Physiologist;Susanne Bice, RN, BSN, CCRP;Laureen Owens Shark, BS, RRT, Respiratory Therapist   Supervising physician immediately available to respond to emergencies See telemetry face sheet for immediately available ER MD   Medication changes reported     No   Fall or balance concerns reported    No   Warm-up and Cool-down Performed on first and last piece of equipment   Resistance Training Performed Yes   VAD Patient? No     Pain Assessment   Currently in Pain? No/denies   Multiple Pain Sites No         Goals Met:  Independence with exercise equipment Exercise tolerated well No report of cardiac concerns or symptoms Strength training completed today  Goals Unmet:  Not Applicable  Comments: Pt able to follow exercise prescription today without complaint.  Will continue to monitor for progression. Reviewed home exercise with pt today.  Pt plans to walking at home for exercise.  Reviewed THR, pulse, RPE, sign and symptoms, and when to call 911 or MD.  Also discussed weather considerations and indoor options.  Pt voiced understanding.  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  I will make Dr. Ubaldo Glassing aware. Alberteen Sam, MA, ACSM RCEP 08/05/2016 10:54 AM   Dr. Emily Filbert is Medical Director for [place] [facility].

## 2016-08-07 ENCOUNTER — Encounter: Payer: Medicare Other | Admitting: *Deleted

## 2016-08-07 DIAGNOSIS — Z955 Presence of coronary angioplasty implant and graft: Secondary | ICD-10-CM

## 2016-08-07 DIAGNOSIS — Z48812 Encounter for surgical aftercare following surgery on the circulatory system: Secondary | ICD-10-CM | POA: Diagnosis not present

## 2016-08-07 NOTE — Progress Notes (Signed)
Daily Session Note  Patient Details  Name: Reginald Franklin MRN: [id number] Date of Birth: Aug 04, 1941 Referring Provider:   Flowsheet Row Cardiac Rehab from 06/30/2016 in [facility]  Referring Provider  Bartholome Bill MD      Encounter Date: 08/07/2016  Check In:     Session Check In - 08/07/16 0826      Check-In   Location [facility]-Cardiac & Pulmonary Rehab   Staff Present Alberteen Sam, MA, ACSM RCEP, Exercise Physiologist;Amanda Oletta Darter, BA, ACSM CEP, Exercise Physiologist;Carroll Enterkin, RN, BSN;Other   Supervising physician immediately available to respond to emergencies See telemetry face sheet for immediately available ER MD   Medication changes reported     No   Fall or balance concerns reported    No   Warm-up and Cool-down Performed on first and last piece of equipment   Resistance Training Performed Yes   VAD Patient? No     Pain Assessment   Currently in Pain? No/denies   Multiple Pain Sites No         Goals Met:  Independence with exercise equipment Exercise tolerated well No report of cardiac concerns or symptoms Strength training completed today  Goals Unmet:  Not Applicable  Comments: Pt able to follow exercise prescription today without complaint.  Will continue to monitor for progression.    Dr. Emily Filbert is Medical Director for [place] [facility].

## 2016-08-12 ENCOUNTER — Encounter: Payer: Medicare Other | Admitting: *Deleted

## 2016-08-12 DIAGNOSIS — Z955 Presence of coronary angioplasty implant and graft: Secondary | ICD-10-CM

## 2016-08-12 DIAGNOSIS — Z48812 Encounter for surgical aftercare following surgery on the circulatory system: Secondary | ICD-10-CM | POA: Diagnosis not present

## 2016-08-12 NOTE — Progress Notes (Signed)
Daily Session Note  Patient Details  Name: Reginald Franklin MRN: [id number] Date of Birth: 09/08/1941 Referring Provider:   Flowsheet Row Cardiac Rehab from 06/30/2016 in [facility] Cardiac and Pulmonary Rehab  Referring Provider  Bartholome Bill MD      Encounter Date: 08/12/2016  Check In:     Session Check In - 08/12/16 0820      Check-In   Location [facility]-Cardiac & Pulmonary Rehab   Staff Present Alberteen Sam, MA, ACSM RCEP, Exercise Physiologist;Laureen Owens Shark, BS, RRT, Respiratory Therapist;Susanne Bice, RN, BSN, CCRP   Supervising physician immediately available to respond to emergencies See telemetry face sheet for immediately available ER MD   Medication changes reported     No   Fall or balance concerns reported    No   Warm-up and Cool-down Performed as group-led instruction   Resistance Training Performed Yes   VAD Patient? No     Pain Assessment   Currently in Pain? No/denies   Multiple Pain Sites No         Goals Met:  Independence with exercise equipment Exercise tolerated well No report of cardiac concerns or symptoms Strength training completed today  Goals Unmet:  Not Applicable  Comments: Pt able to follow exercise prescription today without complaint.  Will continue to monitor for progression.    Dr. Emily Filbert is Medical Director for [place] [facility].

## 2016-08-26 ENCOUNTER — Encounter: Payer: Medicare Other | Attending: Cardiology | Admitting: Respiratory Therapy

## 2016-08-26 DIAGNOSIS — E785 Hyperlipidemia, unspecified: Secondary | ICD-10-CM | POA: Diagnosis not present

## 2016-08-26 DIAGNOSIS — Z48812 Encounter for surgical aftercare following surgery on the circulatory system: Secondary | ICD-10-CM | POA: Diagnosis not present

## 2016-08-26 DIAGNOSIS — Z951 Presence of aortocoronary bypass graft: Secondary | ICD-10-CM | POA: Insufficient documentation

## 2016-08-26 DIAGNOSIS — Z9889 Other specified postprocedural states: Secondary | ICD-10-CM | POA: Insufficient documentation

## 2016-08-26 DIAGNOSIS — Z955 Presence of coronary angioplasty implant and graft: Secondary | ICD-10-CM | POA: Insufficient documentation

## 2016-08-26 DIAGNOSIS — I739 Peripheral vascular disease, unspecified: Secondary | ICD-10-CM | POA: Diagnosis not present

## 2016-08-26 DIAGNOSIS — Z8673 Personal history of transient ischemic attack (TIA), and cerebral infarction without residual deficits: Secondary | ICD-10-CM | POA: Diagnosis not present

## 2016-08-26 DIAGNOSIS — I2510 Atherosclerotic heart disease of native coronary artery without angina pectoris: Secondary | ICD-10-CM | POA: Diagnosis not present

## 2016-08-26 DIAGNOSIS — I10 Essential (primary) hypertension: Secondary | ICD-10-CM | POA: Diagnosis not present

## 2016-08-26 NOTE — Progress Notes (Signed)
Daily Session Note  Patient Details  Name: Reginald Franklin MRN: [id number] Date of Birth: [birth year] Referring Provider:   Flowsheet Row Cardiac Rehab from 06/30/2016 in [facility] Cardiac and Pulmonary Rehab  Referring Provider  Bartholome Bill MD      Encounter Date: 08/26/2016  Check In:     Session Check In - 08/26/16 0846      Check-In   Location [facility]-Cardiac & Pulmonary Rehab   Staff Present Alberteen Sam, MA, ACSM RCEP, Exercise Physiologist;Laureen Owens Shark, BS, RRT, Respiratory Therapist;Susanne Bice, RN, BSN, CCRP   Supervising physician immediately available to respond to emergencies See telemetry face sheet for immediately available ER MD   Medication changes reported     No   Fall or balance concerns reported    No   Warm-up and Cool-down Performed on first and last piece of equipment   Resistance Training Performed Yes   VAD Patient? No     Pain Assessment   Currently in Pain? No/denies         Goals Met:  Independence with exercise equipment Exercise tolerated well No report of cardiac concerns or symptoms Strength training completed today  Goals Unmet:  Not Applicable  Comments: Pt able to follow exercise prescription today without complaint.  Will continue to monitor for progression.    Dr. Emily Filbert is Medical Director for [place] [facility].

## 2016-08-27 ENCOUNTER — Encounter: Payer: Self-pay | Admitting: *Deleted

## 2016-08-27 DIAGNOSIS — Z955 Presence of coronary angioplasty implant and graft: Secondary | ICD-10-CM

## 2016-08-27 NOTE — Progress Notes (Signed)
Cardiac Individual Treatment Plan  Patient Details  Name: Reginald Franklin MRN: [id number] Date of Birth: 1941/03/06 Referring Provider:   Flowsheet Row Cardiac Rehab from 06/30/2016 in [facility] Cardiac and Pulmonary Rehab  Referring Provider  Bartholome Bill MD      Initial Encounter Date:  Flowsheet Row Cardiac Rehab from 06/30/2016 in [facility] Cardiac and Pulmonary Rehab  Date  06/30/16  Referring Provider  Bartholome Bill MD      Visit Diagnosis: S/P coronary artery stent placement  Patient's Home Medications on Admission:  Current Outpatient Prescriptions:  .  aspirin 325 MG tablet, Take 325 mg by mouth at bedtime., Disp: , Rfl:  .  aspirin EC 325 MG EC tablet, Take 1 tablet (325 mg total) by mouth daily., Disp: 30 tablet, Rfl: 0 .  carvedilol (COREG) 6.25 MG tablet, Take 6.25 mg by mouth 2 (two) times daily with a meal., Disp: , Rfl:  .  cetirizine (ZYRTEC) 10 MG tablet, Take 10 mg by mouth at bedtime., Disp: , Rfl:  .  Cholecalciferol 10000 units TABS, Take by mouth., Disp: , Rfl:  .  clopidogrel (PLAVIX) 75 MG tablet, Take 1 tablet (75 mg total) by mouth daily with breakfast., Disp: 30 tablet, Rfl: 11 .  ezetimibe (ZETIA) 10 MG tablet, Take 10 mg by mouth at bedtime., Disp: , Rfl:  .  furosemide (LASIX) 40 MG tablet, Take 40 mg by mouth., Disp: , Rfl:  .  losartan (COZAAR) 50 MG tablet, Take 50 mg by mouth daily., Disp: , Rfl:  .  Lutein 20 MG TABS, Take by mouth., Disp: , Rfl:  .  meloxicam (MOBIC) 7.5 MG tablet, Take 7.5 mg by mouth daily., Disp: , Rfl:  .  metformin (FORTAMET) 1000 MG (OSM) 24 hr tablet, Take 1,000 mg by mouth 2 (two) times daily with a meal., Disp: , Rfl:  .  Misc Natural Products (LUTEIN 20) CAPS, Take by mouth., Disp: , Rfl:  .  Multiple Vitamin (MULTIVITAMIN) tablet, Take 1 tablet by mouth daily., Disp: , Rfl:  .  Multiple Vitamins-Minerals (VISION FORMULA EYE HEALTH PO), Take 1 tablet by mouth daily., Disp: , Rfl:  .  multivitamin-lutein (OCUVITE-LUTEIN) CAPS  capsule, Take 1 capsule by mouth daily., Disp: , Rfl:   Past Medical History: Past Medical History:  Diagnosis Date  . Anginal pain ([place])   . BPH (benign prostatic hyperplasia)   . Coronary artery disease   . Diabetes mellitus without complication [facility])    Patient takes Metformin.  . Erosive gastropathy   . Gastritis   . Hemorrhoids   . Hiatal hernia   . Hyperlipidemia   . Hypertension   . Nasal polyp   . Peripheral vascular disease ([place])   . S/P CABG x 3   . Seasonal allergies   . Stroke [facility])     Tobacco Use: History  Smoking Status  . Never Smoker  Smokeless Tobacco  . Never Used    Labs: Recent Review Flowsheet Data    There is no flowsheet data to display.       Exercise Target Goals:    Exercise Program Goal: Individual exercise prescription set with THRR, safety & activity barriers. Participant demonstrates ability to understand and report RPE using BORG scale, to self-measure pulse accurately, and to acknowledge the importance of the exercise prescription.  Exercise Prescription Goal: Starting with aerobic activity 30 plus minutes a day, 3 days per week for initial exercise prescription. Provide home exercise prescription and guidelines that participant acknowledges  understanding prior to discharge.  Activity Barriers & Risk Stratification:     Activity Barriers & Cardiac Risk Stratification - 06/30/16 1416      Activity Barriers & Cardiac Risk Stratification   Activity Barriers Deconditioning;Muscular Weakness;Other (comment)   Comments Slow getting up from sitting   Cardiac Risk Stratification High      6 Minute Walk:     6 Minute Walk    Row Name 06/30/16 1410         6 Minute Walk   Phase Initial     Distance 1000 feet     Walk Time 6 minutes     # of Rest Breaks 0     MPH 1.89     METS 2.5     RPE 9     VO2 Peak 8.76     Symptoms Yes (comment)     Comments Felt like he was dragging his right foot     Resting HR 76 bpm      Resting BP 142/80     Max Ex. HR 97 bpm     Max Ex. BP 150/76     2 Minute Post BP 140/70        Initial Exercise Prescription:     Initial Exercise Prescription - 06/30/16 1400      Date of Initial Exercise RX and Referring Provider   Date 06/30/16   Referring Provider Harold Hedge MD     Treadmill   MPH 1.7   Grade 0.5   Minutes 15   METs 2.45     NuStep   Level 1   Watts --  80-100 spm   Minutes 15   METs 2     Biostep-RELP   Level 1   Watts --  40-50 spm   Minutes 15   METs 2     Prescription Details   Frequency (times per week) 2   Duration Progress to 45 minutes of aerobic exercise without signs/symptoms of physical distress     Intensity   THRR 40-80% of Max Heartrate 104-131   Ratings of Perceived Exertion 11-15   Perceived Dyspnea 0-4     Progression   Progression Continue to progress workloads to maintain intensity without signs/symptoms of physical distress.     Resistance Training   Training Prescription Yes   Weight 2 lbs   Reps 10-12      Perform Capillary Blood Glucose checks as needed.  Exercise Prescription Changes:     Exercise Prescription Changes    Row Name 06/30/16 1400 07/08/16 1400 07/23/16 0900 08/05/16 1000 08/05/16 1500     Exercise Review   Progression -  Walk Test Results Yes Yes  - Yes     Response to Exercise   Blood Pressure (Admit) 142/80 132/70 132/60  - 124/60   Blood Pressure (Exercise) 150/76 136/64 138/72  - 130/70   Blood Pressure (Exit) 140/70 130/74 122/80  - 126/76   Heart Rate (Admit) 76 bpm 81 bpm 65 bpm  - 79 bpm   Heart Rate (Exercise) 97 bpm 91 bpm 87 bpm  - 96 bpm   Heart Rate (Exit) 80 bpm 78 bpm 62 bpm  - 78 bpm   Oxygen Saturation (Admit) 96 %  -  -  -  -   Oxygen Saturation (Exit) 97 %  -  -  -  -   Rating of Perceived Exertion (Exercise) 9 12 13   - 12   Symptoms dragged right foot  none none none none   Comments  -  -  - Home Exercise Guidelines given 08/05/16 Home Exercise Guidelines  given 08/05/16   Duration  - Progress to 45 minutes of aerobic exercise without signs/symptoms of physical distress Progress to 45 minutes of aerobic exercise without signs/symptoms of physical distress Progress to 45 minutes of aerobic exercise without signs/symptoms of physical distress Progress to 45 minutes of aerobic exercise without signs/symptoms of physical distress   Intensity  - THRR unchanged THRR unchanged THRR unchanged THRR unchanged     Progression   Progression  - Continue to progress workloads to maintain intensity without signs/symptoms of physical distress. Continue to progress workloads to maintain intensity without signs/symptoms of physical distress. Continue to progress workloads to maintain intensity without signs/symptoms of physical distress. Continue to progress workloads to maintain intensity without signs/symptoms of physical distress.   Average METs  - 2.12 2.12 2.12 2.23     Resistance Training   Training Prescription  - Yes Yes Yes Yes   Weight  - 3 lbs 3 lbs 3 lbs 3 lbs   Reps  - 10-12 10-12 10-12 10-12     Interval Training   Interval Training  - No No No No     Treadmill   MPH  - 1.4 1.4 1.4 1.4   Grade  - 0.5 0.5 0.5 0.5   Minutes  - 15 15 15 15    METs  - 2.27 2.27 2.27 2.27     NuStep   Level  - 1 3 3 3    Minutes  - 15 15 15 15    METs  - 2.1 2.1 2.1 2.2     Biostep-RELP   Level  - 1 1 1 1    Minutes  - 15 15 15 15    METs  - 2 2 2 2      Home Exercise Plan   Plans to continue exercise at  -  -  - Home  walking Home  walking   Frequency  -  -  - Add 3 additional days to program exercise sessions. Add 3 additional days to program exercise sessions.   Row Name 08/20/16 1500             Exercise Review   Progression Yes         Response to Exercise   Blood Pressure (Admit) 146/60       Blood Pressure (Exercise) 134/66       Blood Pressure (Exit) 108/60       Heart Rate (Admit) 78 bpm       Heart Rate (Exercise) 91 bpm       Heart Rate  (Exit) 77 bpm       Rating of Perceived Exertion (Exercise) 13       Symptoms none       Comments Home Exercise Guidelines given 08/05/16       Duration Progress to 45 minutes of aerobic exercise without signs/symptoms of physical distress       Intensity THRR unchanged         Progression   Progression Continue to progress workloads to maintain intensity without signs/symptoms of physical distress.       Average METs 2.16         Resistance Training   Training Prescription Yes       Weight 3 lbs       Reps 10-12         Interval Training  Interval Training No         Treadmill   MPH 1.4       Grade 0.5       Minutes 15       METs 2.27         NuStep   Level 3       Minutes 15       METs 2.2         Biostep-RELP   Level 1       Minutes 15       METs 2         Home Exercise Plan   Plans to continue exercise at Home  walking       Frequency Add 3 additional days to program exercise sessions.          Exercise Comments:     Exercise Comments    Row Name 06/30/16 1414 07/03/16 1001 07/08/16 1453 07/23/16 0943 08/05/16 1054   Exercise Comments Exercise goal is to be to walk faster and easier. First full day of exercise!  Patient was oriented to gym and equipment including functions, settings, policies, and procedures.  Patient's individual exercise prescription and treatment plan were reviewed.  All starting workloads were established based on the results of the 6 minute walk test done at initial orientation visit.  The plan for exercise progression was also introduced and progression will be customized based on patient's performance and goals. Reginald Franklin is off to a good start with exercise.  We will continue to monitor his progression. Reginald Franklin is doing well with exercise.  [place].  We will continue to monitor his progression. Reviewed home exercise with pt today.  Pt plans to walking at home for exercise.  Reviewed THR, pulse, RPE, sign and  symptoms, and when to call 911 or MD.  Also discussed weather considerations and indoor options.  Pt voiced understanding.   Row Name 08/05/16 1551 08/20/16 1507         Exercise Comments Reginald Franklin continues to do well with exercise.  We will continue to monitor his progress. Reginald Franklin is out this week on vacation.  He has been doing well with exercise.  We will continue to track his progression.         Discharge Exercise Prescription (Final Exercise Prescription Changes):     Exercise Prescription Changes - 08/20/16 1500      Exercise Review   Progression Yes     Response to Exercise   Blood Pressure (Admit) 146/60   Blood Pressure (Exercise) 134/66   Blood Pressure (Exit) 108/60   Heart Rate (Admit) 78 bpm   Heart Rate (Exercise) 91 bpm   Heart Rate (Exit) 77 bpm   Rating of Perceived Exertion (Exercise) 13   Symptoms none   Comments Home Exercise Guidelines given 08/05/16   Duration Progress to 45 minutes of aerobic exercise without signs/symptoms of physical distress   Intensity THRR unchanged     Progression   Progression Continue to progress workloads to maintain intensity without signs/symptoms of physical distress.   Average METs 2.16     Resistance Training   Training Prescription Yes   Weight 3 lbs   Reps 10-12     Interval Training   Interval Training No     Treadmill   MPH 1.4   Grade 0.5   Minutes 15   METs 2.27     NuStep   Level 3  Minutes 15   METs 2.2     Biostep-RELP   Level 1   Minutes 15   METs 2     Home Exercise Plan   Plans to continue exercise at Home  walking   Frequency Add 3 additional days to program exercise sessions.      Nutrition:  Target Goals: Understanding of nutrition guidelines, daily intake of sodium 1500mg , cholesterol 200mg , calories 30% from fat and 7% or less from saturated fats, daily to have 5 or more servings of fruits and vegetables.  Biometrics:     Pre Biometrics - 06/30/16 1415      Pre Biometrics    Height 5\' 9"  (1.753 m)   Weight 157 lb 12.8 oz (71.6 kg)   Waist Circumference 35 inches   Hip Circumference 36.25 inches   Waist to Hip Ratio 0.97 %   BMI (Calculated) 23.4   Single Leg Stand 26.05 seconds       Nutrition Therapy Plan and Nutrition Goals:     Nutrition Therapy & Goals - 08/07/16 0942      Nutrition Therapy   Diet --  Reginald Franklin still does not want to meet with the dietician.   Drug/Food Interactions Statins/Certain Fruits     Personal Nutrition Goals   Personal Goal #1 Reginald Franklin "Reginald Franklin" eats healthy he said so cont to eat healthy.       Nutrition Discharge: Rate Your Plate Scores:     Nutrition Assessments - 06/30/16 1455      Rate Your Plate Scores   Pre Score 63   Pre Score % 70 %      Nutrition Goals Re-Evaluation:     Nutrition Goals Re-Evaluation    Row Name 08/07/16 0944             Personal Goal #1 Re-Evaluation   Comments Reginald Franklin "Reginald Franklin" was asked again today if he wanted to meet individually with the Cardiac Rehab Registered dietician since there is no extra charge but Reginald Franklin said he didn't feel the need to .           Psychosocial: Target Goals: Acknowledge presence or absence of depression, maximize coping skills, provide positive support system. Participant is able to verbalize types and ability to use techniques and skills needed for reducing stress and depression.  Initial Review & Psychosocial Screening:     Initial Psych Review & Screening - 06/30/16 1232      Initial Review   Current issues with Current Sleep Concerns     Family Dynamics   Good Support System? Yes     Barriers   Psychosocial barriers to participate in program The patient should benefit from training in stress management and relaxation.     Screening Interventions   Interventions Encouraged to exercise      Quality of Life Scores:     Quality of Life - 06/30/16 1230      Quality of Life Scores   Health/Function Pre 24.86 %   Socioeconomic Pre 26 %    Psych/Spiritual Pre 24 %   Family Pre 27 %   GLOBAL Pre 28.16 %      PHQ-9: Recent Review Flowsheet Data    [facility]   Decreased Interest 0   Down, Depressed, Hopeless 0   PHQ - 2 Score 0   Altered sleeping 1   Tired, decreased energy 1   Change in appetite 0   Feeling bad or failure about yourself  0  Trouble concentrating 0   Moving slowly or fidgety/restless 0   Suicidal thoughts 0   PHQ-9 Score 2   Difficult doing work/chores Not difficult at all      Psychosocial Evaluation and Intervention:     Psychosocial Evaluation - 08/07/16 0918      Psychosocial Evaluation & Interventions   Comments Reginald Franklin "Reginald Franklin" said he is ok exercising wtih people around him but wishes his wife could exercise with him but she has feet and back problems.       Psychosocial Re-Evaluation:     Psychosocial Re-Evaluation    Row Name 08/07/16 0940             Psychosocial Re-Evaluation   Interventions Encouraged to attend Cardiac Rehabilitation for the exercise       Comments Reginald Franklin "Reginald Franklin" said his wife can't exercise or walk much with him since she has foot and back problems. She had a heart stent placed a year ago. Reginald Franklin said he likes to be outside better rather than in a gym although "it is ok and other people are exercising around  him". I mentioned [employer] exercise program to him.          Vocational Rehabilitation: Provide vocational rehab assistance to qualifying candidates.   Vocational Rehab Evaluation & Intervention:     Vocational Rehab - 06/30/16 1133      Initial Vocational Rehab Evaluation & Intervention   Assessment shows need for Vocational Rehabilitation (P)  No      Education: Education Goals: Education classes will be provided on a weekly basis, covering required topics. Participant will state understanding/return demonstration of topics presented.  Learning Barriers/Preferences:     Learning Barriers/Preferences - 06/30/16 1419       Learning Barriers/Preferences   Learning Barriers Sight   Learning Preferences None      Education Topics: General Nutrition Guidelines/Fats and Fiber: -Group instruction provided by verbal, written material, models and posters to present the general guidelines for heart healthy nutrition. Gives an explanation and review of dietary fats and fiber. Flowsheet [facility]  Date  08/05/16  Educator  SB  Instruction Review Code  2- meets goals/outcomes      Controlling Sodium/Reading Food Labels: -Group verbal and written material supporting the discussion of sodium use in heart healthy nutrition. Review and explanation with models, verbal and written materials for utilization of the food label. Flowsheet Row [facility]  Date  08/12/16  Educator  PI  Instruction Review Code  2- meets goals/outcomes      Exercise Physiology & Risk Factors: - Group verbal and written instruction with models to review the exercise physiology of the cardiovascular system and associated critical values. Details cardiovascular disease risk factors and the goals associated with each risk factor. Flowsheet [facility]  Date  07/31/16  Educator  [facility]  Instruction Review Code  2- meets goals/outcomes      Aerobic Exercise & Resistance Training: - Gives group verbal and written discussion on the health impact of inactivity. On the components of aerobic and resistive training programs and the benefits of this training and how to safely progress through these programs.   Flexibility, Balance, General Exercise Guidelines: - Provides group verbal and written instruction on the benefits of flexibility and balance training programs. Provides general exercise guidelines with specific guidelines to those with  heart or lung disease. Demonstration  and skill practice provided.   Stress Management: - Provides group verbal and written instruction about the health risks of elevated stress, cause of high stress, and healthy ways to reduce stress. Flowsheet Row Cardiac Rehab from 08/26/2016 in [facility] Cardiac and Pulmonary Rehab  Date  07/10/16  Educator  SB  Instruction Review Code  2- meets goals/outcomes      Depression: - Provides group verbal and written instruction on the correlation between heart/lung disease and depressed mood, treatment options, and the stigmas associated with seeking treatment. Flowsheet Row Cardiac Rehab from 08/26/2016 in [facility] Cardiac and Pulmonary Rehab  Date  08/07/16  Educator  CE  Instruction Review Code  2- meets goals/outcomes      Anatomy & Physiology of the Heart: - Group verbal and written instruction and models provide basic cardiac anatomy and physiology, with the coronary electrical and arterial systems. Review of: AMI, Angina, Valve disease, Heart Failure, Cardiac Arrhythmia, Pacemakers, and the ICD. Flowsheet Row Cardiac Rehab from 08/26/2016 in [facility] Cardiac and Pulmonary Rehab  Date  08/26/16  Educator  SB  Instruction Review Code  2- meets goals/outcomes      Cardiac Procedures: - Group verbal and written instruction and models to describe the testing methods done to diagnose heart disease. Reviews the outcomes of the test results. Describes the treatment choices: Medical Management, Angioplasty, or Coronary Bypass Surgery. Flowsheet Row Cardiac Rehab from 08/26/2016 in [facility] Cardiac and Pulmonary Rehab  Date  07/15/16  Educator  SB  Instruction Review Code  2- meets goals/outcomes      Cardiac Medications: - Group verbal and written instruction to review commonly prescribed medications for heart disease. Reviews the medication, class of the drug, and side effects. Includes the steps to properly store meds and maintain the prescription regimen. Flowsheet Row Cardiac Rehab from 08/26/2016  in [facility] Cardiac and Pulmonary Rehab  Date  07/17/16  Educator  C. EnterkinRN  Instruction Review Code  2- meets goals/outcomes      Go Sex-Intimacy & Heart Disease, Get SMART - Goal Setting: - Group verbal and written instruction through game format to discuss heart disease and the return to sexual intimacy. Provides group verbal and written material to discuss and apply goal setting through the application of the S.M.A.R.T. Method. Flowsheet Row Cardiac Rehab from 08/26/2016 in [facility] Cardiac and Pulmonary Rehab  Date  07/15/16  Educator  SB  Instruction Review Code  2- meets goals/outcomes      Other Matters of the Heart: - Provides group verbal, written materials and models to describe Heart Failure, Angina, Valve Disease, and Diabetes in the realm of heart disease. Includes description of the disease process and treatment options available to the cardiac patient. Flowsheet Row Cardiac Rehab from 08/26/2016 in [facility] Cardiac and Pulmonary Rehab  Date  07/08/16  Educator  SB  Instruction Review Code  2- meets goals/outcomes      Exercise & Equipment Safety: - Individual verbal instruction and demonstration of equipment use and safety with use of the equipment.   Infection Prevention: - Provides verbal and written material to individual with discussion of infection control including proper hand washing and proper equipment cleaning during exercise session.   Falls Prevention: - Provides verbal and written material to individual with discussion of falls prevention and safety.   Diabetes: - Individual verbal and written instruction to review signs/symptoms of diabetes, desired ranges of glucose level fasting, after meals and with exercise. Advice that pre and  post exercise glucose checks will be done for 3 sessions at entry of program.    Knowledge Questionnaire Score:     Knowledge Questionnaire Score - 06/30/16 1230      Knowledge Questionnaire Score   Pre Score 18/28       Core Components/Risk Factors/Patient Goals at Admission:     Personal Goals and Risk Factors at Admission - 06/30/16 1231      Core Components/Risk Factors/Patient Goals on Admission    Weight Management Yes;Weight Maintenance   Intervention Weight Management: Develop a combined nutrition and exercise program designed to reach desired caloric intake, while maintaining appropriate intake of nutrient and fiber, sodium and fats, and appropriate energy expenditure required for the weight goal.;Weight Management: Provide education and appropriate resources to help participant work on and attain dietary goals.   Expected Outcomes Weight Maintenance: Understanding of the daily nutrition guidelines, which includes 25-35% calories from fat, 7% or less cal from saturated fats, less than 200mg  cholesterol, less than 1.5gm of sodium, & 5 or more servings of fruits and vegetables daily   Sedentary Yes   Intervention Provide advice, education, support and counseling about physical activity/exercise needs.;Develop an individualized exercise prescription for aerobic and resistive training based on initial evaluation findings, risk stratification, comorbidities and participant's personal goals.   Expected Outcomes Achievement of increased cardiorespiratory fitness and enhanced flexibility, muscular endurance and strength shown through measurements of functional capacity and personal statement of participant.   Increase Strength and Stamina Yes   Intervention Provide advice, education, support and counseling about physical activity/exercise needs.;Develop an individualized exercise prescription for aerobic and resistive training based on initial evaluation findings, risk stratification, comorbidities and participant's personal goals.   Expected Outcomes Achievement of increased cardiorespiratory fitness and enhanced flexibility, muscular endurance and strength shown through measurements of functional capacity and  personal statement of participant.   Diabetes Yes   Intervention Provide education about signs/symptoms and action to take for hypo/hyperglycemia.;Provide education about proper nutrition, including hydration, and aerobic/resistive exercise prescription along with prescribed medications to achieve blood glucose in normal ranges: Fasting glucose 65-99 mg/dL   Expected Outcomes Short Term: Participant verbalizes understanding of the signs/symptoms and immediate care of hyper/hypoglycemia, proper foot care and importance of medication, aerobic/resistive exercise and nutrition plan for blood glucose control.;Long Term: Attainment of HbA1C < 7%.   Hypertension Yes   Intervention Provide education on lifestyle modifcations including regular physical activity/exercise, weight management, moderate sodium restriction and increased consumption of fresh fruit, vegetables, and low fat dairy, alcohol moderation, and smoking cessation.;Monitor prescription use compliance.   Expected Outcomes Short Term: Continued assessment and intervention until BP is < 140/79mm HG in hypertensive participants. < 130/49mm HG in hypertensive participants with diabetes, heart failure or chronic kidney disease.;Long Term: Maintenance of blood pressure at goal levels.   Lipids Yes   Intervention Provide education and support for participant on nutrition & aerobic/resistive exercise along with prescribed medications to achieve LDL 70mg , HDL >40mg .   Expected Outcomes Short Term: Participant states understanding of desired cholesterol values and is compliant with medications prescribed. Participant is following exercise prescription and nutrition guidelines.;Long Term: Cholesterol controlled with medications as prescribed, with individualized exercise RX and with personalized nutrition plan. Value goals: LDL < 70mg , HDL > 40 mg.   Stress Yes   Intervention Offer individual and/or small group education and counseling on adjustment to heart  disease, stress management and health-related lifestyle change. Teach and support self-help strategies.;Refer participants experiencing significant psychosocial distress to appropriate mental  health specialists for further evaluation and treatment. When possible, include family members and significant others in education/counseling sessions.   Expected Outcomes Short Term: Participant demonstrates changes in health-related behavior, relaxation and other stress management skills, ability to obtain effective social support, and compliance with psychotropic medications if prescribed.;Long Term: Emotional wellbeing is indicated by absence of clinically significant psychosocial distress or social isolation.      Core Components/Risk Factors/Patient Goals Review:      Goals and Risk Factor Review    Row Name 07/15/16 1024 08/07/16 0937           Core Components/Risk Factors/Patient Goals Review   Personal Goals Review Weight Management/Obesity;Sedentary;Increase Strength and Stamina;Diabetes;Lipids;Hypertension  -      Review Reginald Franklin is off to a good start with rehab.  He is starting to feel a little bit better and gaining some strength back.  He is still struggling to get up off the floor, but he says that it has gotten better.  He is not exercising at home yet, but we will talk about this soon.  His weight is holding steady for now.  He has not been checking his blood sugars as he is currently out of lancets.  His blood pressures have been good here, but he does not check them at home.  He is intolerant to statins and has not had a recent lipid check.  He feels that his stress levels have started to go down. Reginald Franklin said the MD did not tell him to check his blood sugars. Reginald Franklin said the only reason he had lancets to check his blood sugars was that he was in a a research study that he is not in now. Reginald Franklin feels Cardiac Rehab is helping some but he likes to do things outside.       Expected Outcomes Reginald Franklin will continue  to come to exercise and education classes to work on risk factor modifications. Heart healthy lifestyle.          Core Components/Risk Factors/Patient Goals at Discharge (Final Review):      Goals and Risk Factor Review - 08/07/16 0937      Core Components/Risk Factors/Patient Goals Review   Review Reginald Franklin said the MD did not tell him to check his blood sugars. Reginald Franklin said the only reason he had lancets to check his blood sugars was that he was in a a research study that he is not in now. Reginald Franklin feels Cardiac Rehab is helping some but he likes to do things outside.    Expected Outcomes Heart healthy lifestyle.       ITP Comments:     ITP Comments    Row Name 06/30/16 1234 06/30/16 1316 07/02/16 0645 07/30/16 0657 08/07/16 0937   ITP Comments "Reginald Franklin" Reginald Fearing said he has always had problems with his cholestrol being high no matter what he eats. We discussed low saturated fat diet helps as a lower cholestrol diet since he already limits his eggs/week. I made him and his wife an individual appt with the  Cardiac REhab REgistered  Dieticain.  ITP Created during Medical Review. Documntation of diagnosis in [facility] 06/10/2016. 30 day review. Continue with ITP unless changes noted by Medical Director at signature of review. New to program 30 day review completed for Medical Director physician review and signature. Continue ITP unless changes made by physician. Vuong "Reginald Franklin" said he is ok exercising wtih people around him but wishes his wife could exercise with him but she has feet and back problems.  Row Name 08/27/16 0556           ITP Comments 30 day review completed for review by Dr Bethann Punches.  Continue with ITP unless changes noted by Dr Hyacinth Meeker.          Comments:

## 2016-08-28 DIAGNOSIS — Z48812 Encounter for surgical aftercare following surgery on the circulatory system: Secondary | ICD-10-CM | POA: Diagnosis not present

## 2016-08-28 DIAGNOSIS — Z955 Presence of coronary angioplasty implant and graft: Secondary | ICD-10-CM

## 2016-08-28 NOTE — Progress Notes (Signed)
Daily Session Note  Patient Details  Name: Reginald Franklin MRN: [id number] Date of Birth: 1940-11-12 Referring Provider:   Flowsheet Row Cardiac Rehab from 06/30/2016 in [facility] Cardiac and Pulmonary Rehab  Referring Provider  Bartholome Bill MD      Encounter Date: 08/28/2016  Check In:     Session Check In - 08/28/16 0906      Check-In   Location [facility]-Cardiac & Pulmonary Rehab   Staff Present Alberteen Sam, MA, ACSM RCEP, Exercise Physiologist;Other;Amanda Oletta Darter, BA, ACSM CEP, Exercise Physiologist  Jena Gauss. RN   Supervising physician immediately available to respond to emergencies See telemetry face sheet for immediately available ER MD   Medication changes reported     No   Fall or balance concerns reported    No   Warm-up and Cool-down Performed on first and last piece of equipment   Resistance Training Performed Yes   VAD Patient? No     Pain Assessment   Currently in Pain? No/denies   Multiple Pain Sites No         Goals Met:  Proper associated with RPD/PD & O2 Sat Independence with exercise equipment Exercise tolerated well Strength training completed today  Goals Unmet:  Not Applicable  Comments: Pt able to follow exercise prescription today without complaint.  Will continue to monitor for progression.    Dr. Emily Filbert is Medical Director for [place] and [facility].

## 2016-09-02 ENCOUNTER — Encounter: Payer: Medicare Other | Admitting: *Deleted

## 2016-09-02 DIAGNOSIS — Z955 Presence of coronary angioplasty implant and graft: Secondary | ICD-10-CM

## 2016-09-02 DIAGNOSIS — Z48812 Encounter for surgical aftercare following surgery on the circulatory system: Secondary | ICD-10-CM | POA: Diagnosis not present

## 2016-09-02 NOTE — Progress Notes (Signed)
Daily Session Note  Patient Details  Name: Reginald Franklin MRN: [id number] Date of Birth: 07-12-1941 Referring Provider:   Flowsheet Row Cardiac Rehab from 06/30/2016 in [facility] Cardiac and Pulmonary Rehab  Referring Provider  Bartholome Bill MD      Encounter Date: 09/02/2016  Check In:     Session Check In - 09/02/16 0901      Check-In   Location [facility]-Cardiac & Pulmonary Rehab   Staff Present Alberteen Sam, MA, ACSM RCEP, Exercise Physiologist;Susanne Bice, RN, BSN, CCRP;Laureen Owens Shark, BS, RRT, Respiratory Therapist   Supervising physician immediately available to respond to emergencies See telemetry face sheet for immediately available ER MD   Medication changes reported     No   Fall or balance concerns reported    No   Warm-up and Cool-down Performed on first and last piece of equipment   Resistance Training Performed Yes   VAD Patient? No     Pain Assessment   Currently in Pain? No/denies   Multiple Pain Sites No         Goals Met:  Independence with exercise equipment Exercise tolerated well No report of cardiac concerns or symptoms Strength training completed today  Goals Unmet:  BP weight  [name]'s weight was up six pounds from last week.  His blood pressure was also elevated at check in.  He denied any symptoms and blood pressure returned to normal levels after education and at check out.  Comments: Pt able to follow exercise prescription today without complaint.  Will continue to monitor for progression.    Dr. Emily Filbert is Medical Director for [place] and [facility].

## 2016-09-04 DIAGNOSIS — Z48812 Encounter for surgical aftercare following surgery on the circulatory system: Secondary | ICD-10-CM | POA: Diagnosis not present

## 2016-09-04 DIAGNOSIS — Z955 Presence of coronary angioplasty implant and graft: Secondary | ICD-10-CM

## 2016-09-04 NOTE — Progress Notes (Signed)
Daily Session Note  Patient Details  Name: Reginald Franklin MRN: [id number] Date of Birth: November 25, 1940 Referring Provider:   Flowsheet Row Cardiac Rehab from 06/30/2016 in [facility] Cardiac and Pulmonary Rehab  Referring Provider  Bartholome Bill MD      Encounter Date: 09/04/2016  Check In:     Session Check In - 09/04/16 0832      Check-In   Location [facility]-Cardiac & Pulmonary Rehab   Staff Present Alberteen Sam, MA, ACSM RCEP, Exercise Physiologist;Amanda Oletta Darter, BA, ACSM CEP, Exercise Physiologist;Other  Jena Gauss RN   Supervising physician immediately available to respond to emergencies See telemetry face sheet for immediately available ER MD   Medication changes reported     No   Fall or balance concerns reported    No   Warm-up and Cool-down Performed on first and last piece of equipment   Resistance Training Performed Yes   VAD Patient? No     Pain Assessment   Currently in Pain? No/denies   Multiple Pain Sites No         Goals Met:  Independence with exercise equipment Exercise tolerated well No report of cardiac concerns or symptoms Strength training completed today  Goals Unmet:  Not Applicable  Comments: Pt able to follow exercise prescription today without complaint.  Will continue to monitor for progression.    Dr. Emily Filbert is Medical Director for [place] and [facility].

## 2016-09-09 ENCOUNTER — Encounter: Payer: Medicare Other | Admitting: Respiratory Therapy

## 2016-09-09 DIAGNOSIS — Z955 Presence of coronary angioplasty implant and graft: Secondary | ICD-10-CM

## 2016-09-09 DIAGNOSIS — Z48812 Encounter for surgical aftercare following surgery on the circulatory system: Secondary | ICD-10-CM | POA: Diagnosis not present

## 2016-09-09 NOTE — Progress Notes (Signed)
Daily Session Note  Patient Details  Name: Reginald Franklin MRN: [id number] Date of Birth: April 12, 1941 Referring Provider:   Flowsheet Row Cardiac Rehab from 06/30/2016 in [facility] Cardiac and Pulmonary Rehab  Referring Provider  Bartholome Bill MD      Encounter Date: 09/09/2016  Check In:     Session Check In - 09/09/16 0829      Check-In   Location [facility]-Cardiac & Pulmonary Rehab   Staff Present Alberteen Sam, MA, ACSM RCEP, Exercise Physiologist;Susanne Bice, RN, BSN, CCRP;Laureen Owens Shark, BS, RRT, Respiratory Therapist   Supervising physician immediately available to respond to emergencies See telemetry face sheet for immediately available ER MD   Medication changes reported     No   Fall or balance concerns reported    No   Warm-up and Cool-down Performed on first and last piece of equipment   Resistance Training Performed Yes   VAD Patient? No     Pain Assessment   Currently in Pain? No/denies   Multiple Pain Sites No         Goals Met:  Independence with exercise equipment Exercise tolerated well No report of cardiac concerns or symptoms Strength training completed today  Goals Unmet:  Not Applicable  Comments: Pt able to follow exercise prescription today without complaint.  Will continue to monitor for progression.    Dr. Emily Filbert is Medical Director for [place] and [facility].

## 2016-09-11 VITALS — Ht 69.0 in | Wt 162.0 lb

## 2016-09-11 DIAGNOSIS — Z48812 Encounter for surgical aftercare following surgery on the circulatory system: Secondary | ICD-10-CM | POA: Diagnosis not present

## 2016-09-11 DIAGNOSIS — Z955 Presence of coronary angioplasty implant and graft: Secondary | ICD-10-CM

## 2016-09-11 NOTE — Progress Notes (Signed)
Daily Session Note  Patient Details  Name: Reginald Franklin MRN: [id number] Date of Birth: [birth year] Referring Provider:   Flowsheet Row Cardiac Rehab from 06/30/2016 in [facility] Cardiac and Pulmonary Rehab  Referring Provider  Bartholome Bill MD      Encounter Date: 09/11/2016  Check In:     Session Check In - 09/11/16 0838      Check-In   Location [facility]-Cardiac & Pulmonary Rehab   Staff Present Alberteen Sam, MA, ACSM RCEP, Exercise Physiologist;Amanda Oletta Darter, BA, ACSM CEP, Exercise Physiologist;Patricia Surles RN BSN   Supervising physician immediately available to respond to emergencies See telemetry face sheet for immediately available ER MD   Medication changes reported     No   Fall or balance concerns reported    No   Warm-up and Cool-down Performed on first and last piece of equipment   Resistance Training Performed Yes   VAD Patient? No     Pain Assessment   Currently in Pain? No/denies   Multiple Pain Sites No         Goals Met:  Independence with exercise equipment Exercise tolerated well No report of cardiac concerns or symptoms Strength training completed today  Goals Unmet:  Not Applicable  Comments: Pt able to follow exercise prescription today without complaint.  Will continue to monitor for progression.     [place] Name 06/30/16 1410 09/11/16 0956       6 Minute Walk   Phase Initial Discharge    Distance 1000 feet 1240 feet    Distance % Change  - 24 %  240 ft    Walk Time 6 minutes 6 minutes    # of Rest Breaks 0 0    MPH 1.89 2.35    METS 2.5 2.98    RPE 9 13    VO2 Peak 8.76 10.46    Symptoms Yes (comment) No    Comments Felt like he was dragging his right foot  -    Resting HR 76 bpm 79 bpm    Resting BP 142/80 120/64    Max Ex. HR 97 bpm 93 bpm    Max Ex. BP 150/76 172/70    2 Minute Post BP 140/70  -         Dr. Emily Filbert is Medical Director for [place] and [facility].

## 2016-09-16 ENCOUNTER — Encounter: Payer: Medicare Other | Admitting: Respiratory Therapy

## 2016-09-16 DIAGNOSIS — Z955 Presence of coronary angioplasty implant and graft: Secondary | ICD-10-CM

## 2016-09-16 DIAGNOSIS — Z48812 Encounter for surgical aftercare following surgery on the circulatory system: Secondary | ICD-10-CM | POA: Diagnosis not present

## 2016-09-16 NOTE — Progress Notes (Signed)
Daily Session Note  Patient Details  Name: Reginald Franklin MRN: [id number] Date of Birth: 10/28/1940 Referring Provider:   Flowsheet Row Cardiac Rehab from 06/30/2016 in [facility] Cardiac and Pulmonary Rehab  Referring Provider  Bartholome Bill MD      Encounter Date: 09/16/2016  Check In:     Session Check In - 09/16/16 0939      Check-In   Location [facility]-Cardiac & Pulmonary Rehab   Staff Present Alberteen Sam, MA, ACSM RCEP, Exercise Physiologist;Susanne Bice, RN, BSN, CCRP;Laureen Owens Shark, BS, RRT, Respiratory Therapist   Supervising physician immediately available to respond to emergencies See telemetry face sheet for immediately available ER MD   Medication changes reported     No   Fall or balance concerns reported    No   Warm-up and Cool-down Performed on first and last piece of equipment   Resistance Training Performed Yes   VAD Patient? No     Pain Assessment   Currently in Pain? No/denies   Multiple Pain Sites No         Goals Met:  Proper associated with RPD/PD & O2 Sat Independence with exercise equipment Exercise tolerated well No report of cardiac concerns or symptoms Strength training completed today  Goals Unmet:  Not Applicable  Comments: Pt able to follow exercise prescription today without complaint.  Will continue to monitor for progression.   Dr. Emily Filbert is Medical Director for [place] and [facility].

## 2016-09-18 ENCOUNTER — Encounter: Payer: Medicare Other | Admitting: *Deleted

## 2016-09-18 DIAGNOSIS — Z955 Presence of coronary angioplasty implant and graft: Secondary | ICD-10-CM

## 2016-09-18 DIAGNOSIS — Z48812 Encounter for surgical aftercare following surgery on the circulatory system: Secondary | ICD-10-CM | POA: Diagnosis not present

## 2016-09-18 NOTE — Progress Notes (Signed)
Daily Session Note  Patient Details  Name: Reginald Franklin MRN: [id number] Date of Birth: [birth year] Referring Provider:   Flowsheet Row Cardiac Rehab from 06/30/2016 in [facility] Cardiac and Pulmonary Rehab  Referring Provider  Bartholome Bill MD      Encounter Date: 09/18/2016  Check In:     Session Check In - 09/18/16 0959      Check-In   Location [facility]-Cardiac & Pulmonary Rehab   Staff Present Heath Lark, RN, BSN, CCRP;Steven Veazie RN BSN;Jessica Luan Pulling, MA, ACSM RCEP, Exercise Physiologist   Supervising physician immediately available to respond to emergencies See telemetry face sheet for immediately available ER MD   Medication changes reported     No   Fall or balance concerns reported    No   Warm-up and Cool-down Performed on first and last piece of equipment   Resistance Training Performed Yes   VAD Patient? No     Pain Assessment   Currently in Pain? No/denies   Multiple Pain Sites No         Goals Met:  Proper associated with RPD/PD & O2 Sat Independence with exercise equipment Exercise tolerated well No report of cardiac concerns or symptoms Strength training completed today  Goals Unmet:  Not Applicable  Comments: Pt able to follow exercise prescription today without complaint.  Will continue to monitor for progression.    Dr. Emily Filbert is Medical Director for [place] and [facility].

## 2016-09-23 ENCOUNTER — Encounter: Payer: Medicare Other | Attending: Cardiology | Admitting: Respiratory Therapy

## 2016-09-23 ENCOUNTER — Encounter: Payer: Self-pay | Admitting: *Deleted

## 2016-09-23 DIAGNOSIS — I739 Peripheral vascular disease, unspecified: Secondary | ICD-10-CM | POA: Insufficient documentation

## 2016-09-23 DIAGNOSIS — I10 Essential (primary) hypertension: Secondary | ICD-10-CM | POA: Diagnosis not present

## 2016-09-23 DIAGNOSIS — Z955 Presence of coronary angioplasty implant and graft: Secondary | ICD-10-CM | POA: Insufficient documentation

## 2016-09-23 DIAGNOSIS — Z48812 Encounter for surgical aftercare following surgery on the circulatory system: Secondary | ICD-10-CM | POA: Diagnosis not present

## 2016-09-23 DIAGNOSIS — Z951 Presence of aortocoronary bypass graft: Secondary | ICD-10-CM | POA: Diagnosis not present

## 2016-09-23 DIAGNOSIS — Z8673 Personal history of transient ischemic attack (TIA), and cerebral infarction without residual deficits: Secondary | ICD-10-CM | POA: Insufficient documentation

## 2016-09-23 DIAGNOSIS — E785 Hyperlipidemia, unspecified: Secondary | ICD-10-CM | POA: Diagnosis not present

## 2016-09-23 DIAGNOSIS — I2510 Atherosclerotic heart disease of native coronary artery without angina pectoris: Secondary | ICD-10-CM | POA: Diagnosis not present

## 2016-09-23 DIAGNOSIS — Z9889 Other specified postprocedural states: Secondary | ICD-10-CM | POA: Diagnosis present

## 2016-09-23 NOTE — Progress Notes (Signed)
Cardiac Individual Treatment Plan  Patient Details  Name: Reginald Franklin MRN: [id number] Date of Birth: 06-Feb-1941 Referring Provider:   Flowsheet Row Cardiac Rehab from 06/30/2016 in [facility] Cardiac and Pulmonary Rehab  Referring Provider  Bartholome Bill MD      Initial Encounter Date:  Flowsheet Row Cardiac Rehab from 06/30/2016 in [facility] Cardiac and Pulmonary Rehab  Date  06/30/16  Referring Provider  Bartholome Bill MD      Visit Diagnosis: S/P coronary artery stent placement  Patient's Home Medications on Admission:  Current Outpatient Prescriptions:  .  aspirin 325 MG tablet, Take 325 mg by mouth at bedtime., Disp: , Rfl:  .  aspirin EC 325 MG EC tablet, Take 1 tablet (325 mg total) by mouth daily., Disp: 30 tablet, Rfl: 0 .  carvedilol (COREG) 6.25 MG tablet, Take 6.25 mg by mouth 2 (two) times daily with a meal., Disp: , Rfl:  .  cetirizine (ZYRTEC) 10 MG tablet, Take 10 mg by mouth at bedtime., Disp: , Rfl:  .  Cholecalciferol 10000 units TABS, Take by mouth., Disp: , Rfl:  .  clopidogrel (PLAVIX) 75 MG tablet, Take 1 tablet (75 mg total) by mouth daily with breakfast., Disp: 30 tablet, Rfl: 11 .  ezetimibe (ZETIA) 10 MG tablet, Take 10 mg by mouth at bedtime., Disp: , Rfl:  .  furosemide (LASIX) 40 MG tablet, Take 40 mg by mouth., Disp: , Rfl:  .  losartan (COZAAR) 50 MG tablet, Take 50 mg by mouth daily., Disp: , Rfl:  .  Lutein 20 MG TABS, Take by mouth., Disp: , Rfl:  .  meloxicam (MOBIC) 7.5 MG tablet, Take 7.5 mg by mouth daily., Disp: , Rfl:  .  metformin (FORTAMET) 1000 MG (OSM) 24 hr tablet, Take 1,000 mg by mouth 2 (two) times daily with a meal., Disp: , Rfl:  .  Misc Natural Products (LUTEIN 20) CAPS, Take by mouth., Disp: , Rfl:  .  Multiple Vitamin (MULTIVITAMIN) tablet, Take 1 tablet by mouth daily., Disp: , Rfl:  .  Multiple Vitamins-Minerals (VISION FORMULA EYE HEALTH PO), Take 1 tablet by mouth daily., Disp: , Rfl:  .  multivitamin-lutein (OCUVITE-LUTEIN) CAPS  capsule, Take 1 capsule by mouth daily., Disp: , Rfl:   Past Medical History: Past Medical History:  Diagnosis Date  . Anginal pain ([place])   . BPH (benign prostatic hyperplasia)   . Coronary artery disease   . Diabetes mellitus without complication [facility])    Patient takes Metformin.  . Erosive gastropathy   . Gastritis   . Hemorrhoids   . Hiatal hernia   . Hyperlipidemia   . Hypertension   . Nasal polyp   . Peripheral vascular disease ([place])   . S/P CABG x 3   . Seasonal allergies   . Stroke [facility])     Tobacco Use: History  Smoking Status  . Never Smoker  Smokeless Tobacco  . Never Used    Labs: Recent Review Flowsheet Data    There is no flowsheet data to display.       Exercise Target Goals:    Exercise Program Goal: Individual exercise prescription set with THRR, safety & activity barriers. Participant demonstrates ability to understand and report RPE using BORG scale, to self-measure pulse accurately, and to acknowledge the importance of the exercise prescription.  Exercise Prescription Goal: Starting with aerobic activity 30 plus minutes a day, 3 days per week for initial exercise prescription. Provide home exercise prescription and guidelines that participant acknowledges  understanding prior to discharge.  Activity Barriers & Risk Stratification:     Activity Barriers & Cardiac Risk Stratification - 06/30/16 1416      Activity Barriers & Cardiac Risk Stratification   Activity Barriers Deconditioning;Muscular Weakness;Other (comment)   Comments Slow getting up from sitting   Cardiac Risk Stratification High      6 Minute Walk:     6 Minute Walk    Row Name 06/30/16 1410 09/11/16 0956       6 Minute Walk   Phase Initial Discharge    Distance 1000 feet 1240 feet    Distance % Change  - 24 %  240 ft    Walk Time 6 minutes 6 minutes    # of Rest Breaks 0 0    MPH 1.89 2.35    METS 2.5 2.98    RPE 9 13    VO2 Peak 8.76 10.46    Symptoms Yes  (comment) No    Comments Felt like he was dragging his right foot  -    Resting HR 76 bpm 79 bpm    Resting BP 142/80 120/64    Max Ex. HR 97 bpm 93 bpm    Max Ex. BP 150/76 172/70    2 Minute Post BP 140/70  -       Initial Exercise Prescription:     Initial Exercise Prescription - 06/30/16 1400      Date of Initial Exercise RX and Referring Provider   Date 06/30/16   Referring Provider Bartholome Bill MD     Treadmill   MPH 1.7   Grade 0.5   Minutes 15   METs 2.45     NuStep   Level 1   Watts --  80-100 spm   Minutes 15   METs 2     Biostep-RELP   Level 1   Watts --  40-50 spm   Minutes 15   METs 2     Prescription Details   Frequency (times per week) 2   Duration Progress to 45 minutes of aerobic exercise without signs/symptoms of physical distress     Intensity   THRR 40-80% of Max Heartrate 104-131   Ratings of Perceived Exertion 11-15   Perceived Dyspnea 0-4     Progression   Progression Continue to progress workloads to maintain intensity without signs/symptoms of physical distress.     Resistance Training   Training Prescription Yes   Weight 2 lbs   Reps 10-12      Perform Capillary Blood Glucose checks as needed.  Exercise Prescription Changes:     Exercise Prescription Changes    Row Name 06/30/16 1400 07/08/16 1400 07/23/16 0900 08/05/16 1000 08/05/16 1500     Exercise Review   Progression -  Walk Test Results Yes Yes  - Yes     Response to Exercise   Blood Pressure (Admit) 142/80 132/70 132/60  - 124/60   Blood Pressure (Exercise) 150/76 136/64 138/72  - 130/70   Blood Pressure (Exit) 140/70 130/74 122/80  - 126/76   Heart Rate (Admit) 76 bpm 81 bpm 65 bpm  - 79 bpm   Heart Rate (Exercise) 97 bpm 91 bpm 87 bpm  - 96 bpm   Heart Rate (Exit) 80 bpm 78 bpm 62 bpm  - 78 bpm   Oxygen Saturation (Admit) 96 %  -  -  -  -   Oxygen Saturation (Exit) 97 %  -  -  -  -  Rating of Perceived Exertion (Exercise) _0 - 12   Symptoms  dragged right foot none none none none   Comments  -  -  - Home Exercise Guidelines given 08/05/16 Home Exercise Guidelines given 08/05/16   Duration  - Progress to 45 minutes of aerobic exercise without signs/symptoms of physical distress Progress to 45 minutes of aerobic exercise without signs/symptoms of physical distress Progress to 45 minutes of aerobic exercise without signs/symptoms of physical distress Progress to 45 minutes of aerobic exercise without signs/symptoms of physical distress   Intensity  - THRR unchanged THRR unchanged THRR unchanged THRR unchanged     Progression   Progression  - Continue to progress workloads to maintain intensity without signs/symptoms of physical distress. Continue to progress workloads to maintain intensity without signs/symptoms of physical distress. Continue to progress workloads to maintain intensity without signs/symptoms of physical distress. Continue to progress workloads to maintain intensity without signs/symptoms of physical distress.   Average METs  - 2.12 2.12 2.12 2.23     Resistance Training   Training Prescription  - Yes Yes Yes Yes   Weight  - 3 lbs 3 lbs 3 lbs 3 lbs   Reps  - 10-12 10-12 10-12 10-12     Interval Training   Interval Training  - No No No No     Treadmill   MPH  - 1.4 1.4 1.4 1.4   Grade  - 0.5 0.5 0.5 0.5   Minutes  - _1 METs  - 2.27 2.27 2.27 2.27     NuStep   Level  - _2 Minutes  - _3 METs  - 2.1 2.1 2.1 2.2     Biostep-RELP   Level  - _4 Minutes  - _5 METs  - _6 Home Exercise Plan   Plans to continue exercise at  -  -  - Home  walking Home  walking   Frequency  -  -  - Add 3 additional days to program exercise sessions. Add 3 additional days to program exercise sessions.   Row Name 08/20/16 1500 09/02/16 1500 09/16/16 1500         Exercise Review   Progression Yes Yes Yes       Response to Exercise   Blood Pressure (Admit) 146/60 174/74   rck 132/74 130/80     Blood Pressure (Exercise) 134/66 144/80 146/88     Blood Pressure (Exit) 108/60 106/56 128/60     Heart Rate (Admit) 78 bpm 79 bpm 87 bpm     Heart Rate (Exercise) 91 bpm 94 bpm 103 bpm     Heart Rate (Exit) 77 bpm 78 bpm 81 bpm     Rating of Perceived Exertion (Exercise) _7 Symptoms none none none     Comments Home Exercise Guidelines given 08/05/16 Home Exercise Guidelines given 08/05/16 Home Exercise Guidelines given 08/05/16     Duration Progress to 45 minutes of aerobic exercise without signs/symptoms of physical distress Progress to 45 minutes of aerobic exercise without signs/symptoms of physical distress Progress to 45 minutes of aerobic exercise without signs/symptoms of physical distress     Intensity THRR unchanged THRR unchanged THRR unchanged       Progression   Progression Continue to progress workloads to maintain intensity  without signs/symptoms of physical distress. Continue to progress workloads to maintain intensity without signs/symptoms of physical distress. Continue to progress workloads to maintain intensity without signs/symptoms of physical distress.     Average METs 2.16 2.09 2.09       Resistance Training   Training Prescription Yes Yes Yes     Weight 3 lbs 3 lbs 3 lbs     Reps 10-12 10-12 10-12       Interval Training   Interval Training No No No       Treadmill   MPH 1.4 1.4 1.4     Grade 0.5 0.5 0.5     Minutes _0 METs 2.27 2.27 2.27       NuStep   Level _1 Minutes _2 METs 2.2 2 2.3       Biostep-RELP   Level _3 Minutes _4 METs _5 Home Exercise Plan   Plans to continue exercise at Home  walking Home  walking Home  walking     Frequency Add 3 additional days to program exercise sessions. Add 3 additional days to program exercise sessions. Add 3 additional days to program exercise sessions.  but not actually doing anything        Exercise Comments:      Exercise Comments    Row Name 06/30/16 1414 07/03/16 1001 07/08/16 1453 07/23/16 0943 08/05/16 1054   Exercise Comments Exercise goal is to be to walk faster and easier. First full day of exercise!  Patient was oriented to gym and equipment including functions, settings, policies, and procedures.  Patient's individual exercise prescription and treatment plan were reviewed.  All starting workloads were established based on the results of the 6 minute walk test done at initial orientation visit.  The plan for exercise progression was also introduced and progression will be customized based on patient's performance and goals. Reginald Franklin is off to a good start with exercise.  We will continue to monitor his progression. Reginald Franklin is doing well with exercise.  [place]  We will continue to monitor his progression. Reviewed home exercise with pt today.  Pt plans to walking at home for exercise.  Reviewed THR, pulse, RPE, sign and symptoms, and when to call 911 or MD.  Also discussed weather considerations and indoor options.  Pt voiced understanding.   [place] Name 08/05/16 1551 08/20/16 1507 09/02/16 1041 09/16/16 1526     Exercise Comments Reginald Franklin continues to do well with exercise.  We will continue to monitor his progress. Reginald Franklin is out this week on vacation.  He has been doing well with exercise.  We will continue to track his progression. Reginald Franklin's weight was up six pounds from last week.  His blood pressure was also elevated at check in.  He denied any symptoms and blood pressure returned to normal levels after education and at check out.  We will continue to monitor his blood pressure and his progression. Reginald Franklin will be graduating next week!  He improved his walk test by 24%!!  He is considering coming to [employer] as he admits to being lazy and not exercising at home.       Discharge Exercise Prescription (Final Exercise Prescription Changes):     Exercise Prescription Changes - 09/16/16  1500      Exercise Review   Progression Yes     Response to Exercise   Blood Pressure (Admit) 130/80   Blood Pressure (Exercise) 146/88   Blood Pressure (Exit) 128/60   Heart Rate (Admit) 87 bpm   Heart Rate (Exercise) 103 bpm   Heart Rate (Exit) 81 bpm   Rating of Perceived Exertion (Exercise) 13   Symptoms none   Comments Home Exercise Guidelines given 08/05/16   Duration Progress to 45 minutes of aerobic exercise without signs/symptoms of physical distress   Intensity THRR unchanged     Progression   Progression Continue to progress workloads to maintain intensity without signs/symptoms of physical distress.   Average METs 2.09     Resistance Training   Training Prescription Yes   Weight 3 lbs   Reps 10-12     Interval Training   Interval Training No     Treadmill   MPH 1.4   Grade 0.5   Minutes 15   METs 2.27     NuStep   Level 3   Minutes 15   METs 2.3     Biostep-RELP   Level 4   Minutes 15   METs 2     Home Exercise Plan   Plans to continue exercise at Home  walking   Frequency Add 3 additional days to program exercise sessions.  but not actually doing anything      Nutrition:  Target Goals: Understanding of nutrition guidelines, daily intake of sodium <1557m, cholesterol <2099m calories 30% from fat and 7% or less from saturated fats, daily to have 5 or more servings of fruits and vegetables.  Biometrics:     Pre Biometrics - 06/30/16 1415      Pre Biometrics   Height _0  (1.753 m)   Weight 157 lb 12.8 oz (71.6 kg)   Waist Circumference 35 inches   Hip Circumference 36.25 inches   Waist to Hip Ratio 0.97 %   BMI (Calculated) 23.4   Single Leg Stand 26.05 seconds         Post Biometrics - 09/11/16 1033       Post  Biometrics   Height _1  (1.753 m)   Weight 162 lb (73.5 kg)   Waist Circumference 36.5 inches   Hip Circumference 38.5 inches   Waist to Hip Ratio 0.95 %   BMI (Calculated) 24      Nutrition Therapy Plan and  Nutrition Goals:     Nutrition Therapy & Goals - 08/07/16 0942      Nutrition Therapy   Diet --  JaMalekitill does not want to meet with the dietician.   Drug/Food Interactions Statins/Certain Fruits     Personal Nutrition Goals   Personal Goal #1 JaMyerIkObie Dredgeeats healthy he said so cont to eat healthy.       Nutrition Discharge: Rate Your Plate Scores:     Nutrition Assessments - 09/11/16 0846      Rate Your Plate Scores   Pre Score 63   Pre Score % 70 %   Post Score 63   Post Score % 72.4 %   % Change 2.4 %      Nutrition Goals Re-Evaluation:     Nutrition Goals Re-Evaluation    Row Name 08/07/16 0944 09/16/16 1029           Personal Goal #1 Re-Evaluation   Comments JaJeneen RinksIke" was asked again today if he wanted to meet individually with  the Cardiac Rehab Registered dietician since there is no extra charge but Reginald Franklin said he didn't feel the need to .   -        Intervention Plan   Comments  - Reginald Franklin continues to decline appointment with dietician.  He says that he is going to eat what he wants either way.  He is being more mindful of his salt in take.         Psychosocial: Target Goals: Acknowledge presence or absence of depression, maximize coping skills, provide positive support system. Participant is able to verbalize types and ability to use techniques and skills needed for reducing stress and depression.  Initial Review & Psychosocial Screening:     Initial Psych Review & Screening - 06/30/16 1232      Initial Review   Current issues with Current Sleep Concerns     Family Dynamics   Good Support System? Yes     Barriers   Psychosocial barriers to participate in program The patient should benefit from training in stress management and relaxation.     Screening Interventions   Interventions Encouraged to exercise      Quality of Life Scores:     Quality of Life - 09/11/16 0846      Quality of Life Scores   Health/Function Pre 24.86 %    Health/Function Post 18.61 %   Health/Function % Change -25.14 %   Socioeconomic Pre 26 %   Socioeconomic Post 15.83 %   Socioeconomic % Change  -39.12 %   Psych/Spiritual Pre 24 %   Psych/Spiritual Post 22.57 %   Psych/Spiritual % Change -5.96 %   Family Pre 27 %   Family Post 14.88 %   Family % Change -44.89 %   GLOBAL Pre 28.16 %   GLOBAL Post 18.48 %   GLOBAL % Change -34.38 %      PHQ-9: Recent Review Flowsheet Data    Depression screen [facility] 2/9 09/11/2016 06/30/2016   Decreased Interest 2 0   Down, Depressed, Hopeless 2 0   PHQ - 2 Score 4 0   Altered sleeping 1 1   Tired, decreased energy 1 1   Change in appetite 1 0   Feeling bad or failure about yourself  0 0   Trouble concentrating 1 0   Moving slowly or fidgety/restless 2 0   Suicidal thoughts 0 0   PHQ-9 Score 10 2   Difficult doing work/chores Not difficult at all Not difficult at all      Psychosocial Evaluation and Intervention:     Psychosocial Evaluation - 09/11/16 0936      Discharge Psychosocial Assessment & Intervention   Comments Counselor met with Mr. Johnette Abraham today as a follow up prior to discharge.  Exercise physiologist brought to Counselor's attention that Mr. Loraine Grip Quality of Life Scores had decreased since coming into the program as well as his PHQ-9 score increased - possibily indicating some depression is present.  Counselor questioned Mr. Johnette Abraham about these scores and he reports his spouse is depressed and she completed the packet of all of this information for him.  Counselor reviewed the PHQ-9 and the scores went from a 15 indicating moderate depression to a 10 indicating some mild depression may be present.  Mr. Johnette Abraham states that his wife's decline in health has impacted him in that she is unable to get up and get out to do things which keeps him more sedentary to be there with her.  He reports "  not much" difference in his stamina and strength since coming into this program; but he has enjoyed getting up and  getting out of the house to be with other people more than the exercise itself.  Counselor discussed Mr. E speaking with his Dr. about his depressive symptoms and to consider a follow - up exercise program outside the home to continue consistency in exercise and to decrease feelings of isolation and hopelessness.          Psychosocial Re-Evaluation:     Psychosocial Re-Evaluation    [place] Name 08/07/16 0940             Psychosocial Re-Evaluation   Interventions Encouraged to attend Cardiac Rehabilitation for the exercise       Comments Weaver "Reginald Franklin" said his wife can't exercise or walk much with him since she has foot and back problems. She had a heart stent placed a year ago. Reginald Franklin said he likes to be outside better rather than in a gym although "it is ok and other people are exercising around  him". I mentioned [employer] exercise program to him.          Vocational Rehabilitation: Provide vocational rehab assistance to qualifying candidates.   Vocational Rehab Evaluation & Intervention:     Vocational Rehab - 06/30/16 1133      Initial Vocational Rehab Evaluation & Intervention   Assessment shows need for Vocational Rehabilitation (P)  No      Education: Education Goals: Education classes will be provided on a weekly basis, covering required topics. Participant will state understanding/return demonstration of topics presented.  Learning Barriers/Preferences:     Learning Barriers/Preferences - 06/30/16 1419      Learning Barriers/Preferences   Learning Barriers Sight   Learning Preferences None      Education Topics: General Nutrition Guidelines/Fats and Fiber: -Group instruction provided by verbal, written material, models and posters to present the general guidelines for heart healthy nutrition. Gives an explanation and review of dietary fats and fiber. Flowsheet Row Cardiac Rehab from 09/11/2016 in [facility] Cardiac and Pulmonary Rehab  Date  08/05/16  Educator  SB   Instruction Review Code  2- meets goals/outcomes      Controlling Sodium/Reading Food Labels: -Group verbal and written material supporting the discussion of sodium use in heart healthy nutrition. Review and explanation with models, verbal and written materials for utilization of the food label. Flowsheet Row Cardiac Rehab from 09/11/2016 in [facility] Cardiac and Pulmonary Rehab  Date  08/12/16  Educator  PI  Instruction Review Code  2- meets goals/outcomes      Exercise Physiology & Risk Factors: - Group verbal and written instruction with models to review the exercise physiology of the cardiovascular system and associated critical values. Details cardiovascular disease risk factors and the goals associated with each risk factor. Flowsheet Row Cardiac Rehab from 09/11/2016 in [facility] Cardiac and Pulmonary Rehab  Date  07/31/16  Educator  [facility]  Instruction Review Code  2- meets goals/outcomes      Aerobic Exercise & Resistance Training: - Gives group verbal and written discussion on the health impact of inactivity. On the components of aerobic and resistive training programs and the benefits of this training and how to safely progress through these programs.   Flexibility, Balance, General Exercise Guidelines: - Provides group verbal and written instruction on the benefits of flexibility and balance training programs. Provides general exercise guidelines with specific guidelines to those with heart or lung disease. Demonstration and skill practice  provided.   Stress Management: - Provides group verbal and written instruction about the health risks of elevated stress, cause of high stress, and healthy ways to reduce stress. Flowsheet Row Cardiac Rehab from 09/11/2016 in [facility] Cardiac and Pulmonary Rehab  Date  09/04/16  Educator  TS  Instruction Review Code  2- meets goals/outcomes      Depression: - Provides group verbal and written instruction on the correlation between heart/lung  disease and depressed mood, treatment options, and the stigmas associated with seeking treatment. Flowsheet Row Cardiac Rehab from 09/11/2016 in [facility] Cardiac and Pulmonary Rehab  Date  08/07/16  Educator  CE  Instruction Review Code  2- meets goals/outcomes      Anatomy & Physiology of the Heart: - Group verbal and written instruction and models provide basic cardiac anatomy and physiology, with the coronary electrical and arterial systems. Review of: AMI, Angina, Valve disease, Heart Failure, Cardiac Arrhythmia, Pacemakers, and the ICD. Flowsheet Row Cardiac Rehab from 09/11/2016 in [facility] Cardiac and Pulmonary Rehab  Date  08/26/16  Educator  SB  Instruction Review Code  2- meets goals/outcomes      Cardiac Procedures: - Group verbal and written instruction and models to describe the testing methods done to diagnose heart disease. Reviews the outcomes of the test results. Describes the treatment choices: Medical Management, Angioplasty, or Coronary Bypass Surgery. Flowsheet Row Cardiac Rehab from 09/11/2016 in [facility] Cardiac and Pulmonary Rehab  Date  09/02/16  Educator  SB  Instruction Review Code  2- meets goals/outcomes      Cardiac Medications: - Group verbal and written instruction to review commonly prescribed medications for heart disease. Reviews the medication, class of the drug, and side effects. Includes the steps to properly store meds and maintain the prescription regimen. Flowsheet Row Cardiac Rehab from 09/11/2016 in [facility] Cardiac and Pulmonary Rehab  Date  09/11/16  Educator  TS  Instruction Review Code  2- meets goals/outcomes      Go Sex-Intimacy & Heart Disease, Get SMART - Goal Setting: - Group verbal and written instruction through game format to discuss heart disease and the return to sexual intimacy. Provides group verbal and written material to discuss and apply goal setting through the application of the S.M.A.R.T. Method. Flowsheet Row Cardiac Rehab from  09/11/2016 in [facility] Cardiac and Pulmonary Rehab  Date  09/02/16  Educator  SB  Instruction Review Code  2- meets goals/outcomes      Other Matters of the Heart: - Provides group verbal, written materials and models to describe Heart Failure, Angina, Valve Disease, and Diabetes in the realm of heart disease. Includes description of the disease process and treatment options available to the cardiac patient. Flowsheet Row Cardiac Rehab from 09/11/2016 in [facility] Cardiac and Pulmonary Rehab  Date  07/08/16  Educator  SB  Instruction Review Code  2- meets goals/outcomes      Exercise & Equipment Safety: - Individual verbal instruction and demonstration of equipment use and safety with use of the equipment.   Infection Prevention: - Provides verbal and written material to individual with discussion of infection control including proper hand washing and proper equipment cleaning during exercise session.   Falls Prevention: - Provides verbal and written material to individual with discussion of falls prevention and safety.   Diabetes: - Individual verbal and written instruction to review signs/symptoms of diabetes, desired ranges of glucose level fasting, after meals and with exercise. Advice that pre and post exercise glucose checks will be done for 3  sessions at entry of program.    Knowledge Questionnaire Score:     Knowledge Questionnaire Score - 09/11/16 1033      Knowledge Questionnaire Score   Pre Score 18/28   Post Score 19/28      Core Components/Risk Factors/Patient Goals at Admission:     Personal Goals and Risk Factors at Admission - 06/30/16 1231      Core Components/Risk Factors/Patient Goals on Admission    Weight Management Yes;Weight Maintenance   Intervention Weight Management: Develop a combined nutrition and exercise program designed to reach desired caloric intake, while maintaining appropriate intake of nutrient and fiber, sodium and fats, and appropriate  energy expenditure required for the weight goal.;Weight Management: Provide education and appropriate resources to help participant work on and attain dietary goals.   Expected Outcomes Weight Maintenance: Understanding of the daily nutrition guidelines, which includes 25-35% calories from fat, 7% or less cal from saturated fats, less than 257m cholesterol, less than 1.5gm of sodium, & 5 or more servings of fruits and vegetables daily   Sedentary Yes   Intervention Provide advice, education, support and counseling about physical activity/exercise needs.;Develop an individualized exercise prescription for aerobic and resistive training based on initial evaluation findings, risk stratification, comorbidities and participant's personal goals.   Expected Outcomes Achievement of increased cardiorespiratory fitness and enhanced flexibility, muscular endurance and strength shown through measurements of functional capacity and personal statement of participant.   Increase Strength and Stamina Yes   Intervention Provide advice, education, support and counseling about physical activity/exercise needs.;Develop an individualized exercise prescription for aerobic and resistive training based on initial evaluation findings, risk stratification, comorbidities and participant's personal goals.   Expected Outcomes Achievement of increased cardiorespiratory fitness and enhanced flexibility, muscular endurance and strength shown through measurements of functional capacity and personal statement of participant.   Diabetes Yes   Intervention Provide education about signs/symptoms and action to take for hypo/hyperglycemia.;Provide education about proper nutrition, including hydration, and aerobic/resistive exercise prescription along with prescribed medications to achieve blood glucose in normal ranges: Fasting glucose 65-99 mg/dL   Expected Outcomes Short Term: Participant verbalizes understanding of the signs/symptoms and  immediate care of hyper/hypoglycemia, proper foot care and importance of medication, aerobic/resistive exercise and nutrition plan for blood glucose control.;Long Term: Attainment of HbA1C < 7%.   Hypertension Yes   Intervention Provide education on lifestyle modifcations including regular physical activity/exercise, weight management, moderate sodium restriction and increased consumption of fresh fruit, vegetables, and low fat dairy, alcohol moderation, and smoking cessation.;Monitor prescription use compliance.   Expected Outcomes Short Term: Continued assessment and intervention until BP is < 140/936mHG in hypertensive participants. < 130/8064mG in hypertensive participants with diabetes, heart failure or chronic kidney disease.;Long Term: Maintenance of blood pressure at goal levels.   Lipids Yes   Intervention Provide education and support for participant on nutrition & aerobic/resistive exercise along with prescribed medications to achieve LDL <51m6mDL >40mg73mExpected Outcomes Short Term: Participant states understanding of desired cholesterol values and is compliant with medications prescribed. Participant is following exercise prescription and nutrition guidelines.;Long Term: Cholesterol controlled with medications as prescribed, with individualized exercise RX and with personalized nutrition plan. Value goals: LDL < 51mg,38m > 40 mg.   Stress Yes   Intervention Offer individual and/or small group education and counseling on adjustment to heart disease, stress management and health-related lifestyle change. Teach and support self-help strategies.;Refer participants experiencing significant psychosocial distress to appropriate mental health specialists for further evaluation  and treatment. When possible, include family members and significant others in education/counseling sessions.   Expected Outcomes Short Term: Participant demonstrates changes in health-related behavior, relaxation and  other stress management skills, ability to obtain effective social support, and compliance with psychotropic medications if prescribed.;Long Term: Emotional wellbeing is indicated by absence of clinically significant psychosocial distress or social isolation.      Core Components/Risk Factors/Patient Goals Review:      Goals and Risk Factor Review    Row Name 07/15/16 1024 08/07/16 0937 09/16/16 1018         Core Components/Risk Factors/Patient Goals Review   Personal Goals Review Weight Management/Obesity;Sedentary;Increase Strength and Stamina;Diabetes;Lipids;Hypertension  - Weight Management/Obesity;Sedentary;Increase Strength and Stamina;Diabetes;Lipids;Hypertension     Review Reginald Franklin is off to a good start with rehab.  He is starting to feel a little bit better and gaining some strength back.  He is still struggling to get up off the floor, but he says that it has gotten better.  He is not exercising at home yet, but we will talk about this soon.  His weight is holding steady for now.  He has not been checking his blood sugars as he is currently out of lancets.  His blood pressures have been good here, but he does not check them at home.  He is intolerant to statins and has not had a recent lipid check.  He feels that his stress levels have started to go down. Reginald Franklin said the MD did not tell him to check his blood sugars. Reginald Franklin said the only reason he had lancets to check his blood sugars was that he was in a a research study that he is not in now. Reginald Franklin feels Cardiac Rehab is helping some but he likes to do things outside.  Reginald Franklin has been doing well in rehab.  His weight has been steady.  He is not checking his blood sugars but has not felt like he has had any swings.  He also has not been checking his blood pressures at home as he needs new batteries for his machine.  He admits to being lazy and not doing his home exercise.  However, he does feel stronger after having come to rehab.  He is going to to think  about coming to [employer] and will let us know before he graduates.  He has not had any problems with his medications.     Expected Outcomes Reginald Franklin will continue to come to exercise and education classes to work on risk factor modifications. Heart healthy lifestyle.  Short Term: Reginald Franklin is going to replace his batteries in his cuff to montior his blood pressure and watch his diet.  Long Term: Reginald Franklin will continue to work on his weight control with diet and exercise.        Core Components/Risk Factors/Patient Goals at Discharge (Final Review):      Goals and Risk Factor Review - 09/16/16 1018      Core Components/Risk Factors/Patient Goals Review   Personal Goals Review Weight Management/Obesity;Sedentary;Increase Strength and Stamina;Diabetes;Lipids;Hypertension   Review Reginald Franklin has been doing well in rehab.  His weight has been steady.  He is not checking his blood sugars but has not felt like he has had any swings.  He also has not been checking his blood pressures at home as he needs new batteries for his machine.  He admits to being lazy and not doing his home exercise.  However, he does feel stronger after having come to rehab.  He is going to to think about coming to [employer] and will let us know before he graduates.  He has not had any problems with his medications.   Expected Outcomes Short Term: Reginald Franklin is going to replace his batteries in his cuff to montior his blood pressure and watch his diet.  Long Term: Reginald Franklin will continue to work on his weight control with diet and exercise.      ITP Comments:     ITP Comments    Row Name 06/30/16 1234 06/30/16 1316 07/02/16 0645 07/30/16 0657 08/07/16 0937   ITP Comments "Reginald Franklin" Jeneen Rinks said he has always had problems with his cholestrol being high no matter what he eats. We discussed low saturated fat diet helps as a lower cholestrol diet since he already limits his eggs/week. I made him and his wife an individual appt with the  Cardiac REhab REgistered   [place].  ITP Created during Medical Review. Documntation of diagnosis in [facility] 06/10/2016. 30 day review. Continue with ITP unless changes noted by Medical Director at signature of review. New to program 30 day review completed for Medical Director physician review and signature. Continue ITP unless changes made by physician. Cross "Reginald Franklin" said he is ok exercising wtih people around him but wishes his wife could exercise with him but she has feet and back problems.    [place] Name 08/27/16 0556 09/23/16 0616         ITP Comments 30 day review completed for review by Dr Emily Filbert.  Continue with ITP unless changes noted by Dr Sabra Heck. 30 day review. Continue with ITP unless directed changes per Medical Director review.         Comments:

## 2016-09-23 NOTE — Progress Notes (Signed)
Daily Session Note  Patient Details  Name: Reginald Franklin MRN: [id number] Date of Birth: Jan 13, 1941 Referring Provider:   Flowsheet Row Cardiac Rehab from 06/30/2016 in [facility]  Referring Provider  Bartholome Bill MD      Encounter Date: 09/23/2016  Check In:     Session Check In - 09/23/16 0904      Check-In   Location [facility]-Cardiac & Pulmonary Rehab   Staff Present Alberteen Sam, MA, ACSM RCEP, Exercise Physiologist;Susanne Bice, RN, BSN, CCRP;Laureen Owens Shark, BS, RRT, Respiratory Therapist   Supervising physician immediately available to respond to emergencies See telemetry face sheet for immediately available ER MD   Medication changes reported     No   Fall or balance concerns reported    No   Warm-up and Cool-down Performed on first and last piece of equipment   Resistance Training Performed Yes   VAD Patient? No     Pain Assessment   Currently in Pain? No/denies   Multiple Pain Sites No         Goals Met:  Proper associated with RPD/PD & O2 Sat Independence with exercise equipment Exercise tolerated well No report of cardiac concerns or symptoms Strength training completed today  Goals Unmet:  Not Applicable  Comments: Pt able to follow exercise prescription today without complaint.  Will continue to monitor for progression.   Dr. Emily Filbert is Medical Director for [place] and [facility].

## 2016-09-23 NOTE — Patient Instructions (Signed)
Discharge Instructions  Patient Details  Name: Reginald Franklin MRN: [id number] Date of Birth: December 03, 1940 Referring Provider:  Dalia HeadingFath, Kenneth A, MD   Number of Visits: [id number]  Reason for Discharge:  Patient reached a stable level of exercise.  Smoking History:  History  Smoking Status  . Never Smoker  Smokeless Tobacco  . Never Used    Diagnosis:  S/P coronary artery stent placement  Initial Exercise Prescription:     Initial Exercise Prescription - 06/30/16 1400      Date of Initial Exercise RX and Referring Provider   Date 06/30/16   Referring Provider Harold HedgeFath, Kenneth MD     Treadmill   MPH 1.7   Grade 0.5   Minutes 15   METs 2.45     NuStep   Level 1   Watts --  80-100 spm   Minutes 15   METs 2     Biostep-RELP   Level 1   Watts --  40-50 spm   Minutes 15   METs 2     Prescription Details   Frequency (times per week) 2   Duration Progress to 45 minutes of aerobic exercise without signs/symptoms of physical distress     Intensity   THRR 40-80% of Max Heartrate 104-131   Ratings of Perceived Exertion 11-15   Perceived Dyspnea 0-4     Progression   Progression Continue to progress workloads to maintain intensity without signs/symptoms of physical distress.     Resistance Training   Training Prescription Yes   Weight 2 lbs   Reps 10-12      Discharge Exercise Prescription (Final Exercise Prescription Changes):     Exercise Prescription Changes - 09/16/16 1500      Exercise Review   Progression Yes     Response to Exercise   Blood Pressure (Admit) 130/80   Blood Pressure (Exercise) 146/88   Blood Pressure (Exit) 128/60   Heart Rate (Admit) 87 bpm   Heart Rate (Exercise) 103 bpm   Heart Rate (Exit) 81 bpm   Rating of Perceived Exertion (Exercise) 13   Symptoms none   Comments Home Exercise Guidelines given 08/05/16   Duration Progress to 45 minutes of aerobic exercise without signs/symptoms of physical distress   Intensity THRR unchanged      Progression   Progression Continue to progress workloads to maintain intensity without signs/symptoms of physical distress.   Average METs 2.09     Resistance Training   Training Prescription Yes   Weight 3 lbs   Reps 10-12     Interval Training   Interval Training No     Treadmill   MPH 1.4   Grade 0.5   Minutes 15   METs 2.27     NuStep   Level 3   Minutes 15   METs 2.3     Biostep-RELP   Level 4   Minutes 15   METs 2     Home Exercise Plan   Plans to continue exercise at Home  walking   Frequency Add 3 additional days to program exercise sessions.  but not actually doing anything      Functional Capacity:     6 Minute Walk    Row Name 06/30/16 1410 09/11/16 0956       6 Minute Walk   Phase Initial Discharge    Distance 1000 feet 1240 feet    Distance % Change  - 24 %  240 ft    Walk Time 6 minutes  6 minutes    # of Rest Breaks 0 0    MPH 1.89 2.35    METS 2.5 2.98    RPE 9 13    VO2 Peak 8.76 10.46    Symptoms Yes (comment) No    Comments Felt like he was dragging his right foot  -    Resting HR 76 bpm 79 bpm    Resting BP 142/80 120/64    Max Ex. HR 97 bpm 93 bpm    Max Ex. BP 150/76 172/70    2 Minute Post BP 140/70  -       Quality of Life:     Quality of Life - 09/11/16 0846      Quality of Life Scores   Health/Function Pre 24.86 %   Health/Function Post 18.61 %   Health/Function % Change -25.14 %   Socioeconomic Pre 26 %   Socioeconomic Post 15.83 %   Socioeconomic % Change  -39.12 %   Psych/Spiritual Pre 24 %   Psych/Spiritual Post 22.57 %   Psych/Spiritual % Change -5.96 %   Family Pre 27 %   Family Post 14.88 %   Family % Change -44.89 %   GLOBAL Pre 28.16 %   GLOBAL Post 18.48 %   GLOBAL % Change -34.38 %      Personal Goals: Goals established at orientation with interventions provided to work toward goal.     Personal Goals and Risk Factors at Admission - 06/30/16 1231      Core Components/Risk  Factors/Patient Goals on Admission    Weight Management Yes;Weight Maintenance   Intervention Weight Management: Develop a combined nutrition and exercise program designed to reach desired caloric intake, while maintaining appropriate intake of nutrient and fiber, sodium and fats, and appropriate energy expenditure required for the weight goal.;Weight Management: Provide education and appropriate resources to help participant work on and attain dietary goals.   Expected Outcomes Weight Maintenance: Understanding of the daily nutrition guidelines, which includes 25-35% calories from fat, 7% or less cal from saturated fats, less than 200mg  cholesterol, less than 1.5gm of sodium, & 5 or more servings of fruits and vegetables daily   Sedentary Yes   Intervention Provide advice, education, support and counseling about physical activity/exercise needs.;Develop an individualized exercise prescription for aerobic and resistive training based on initial evaluation findings, risk stratification, comorbidities and participant's personal goals.   Expected Outcomes Achievement of increased cardiorespiratory fitness and enhanced flexibility, muscular endurance and strength shown through measurements of functional capacity and personal statement of participant.   Increase Strength and Stamina Yes   Intervention Provide advice, education, support and counseling about physical activity/exercise needs.;Develop an individualized exercise prescription for aerobic and resistive training based on initial evaluation findings, risk stratification, comorbidities and participant's personal goals.   Expected Outcomes Achievement of increased cardiorespiratory fitness and enhanced flexibility, muscular endurance and strength shown through measurements of functional capacity and personal statement of participant.   Diabetes Yes   Intervention Provide education about signs/symptoms and action to take for hypo/hyperglycemia.;Provide  education about proper nutrition, including hydration, and aerobic/resistive exercise prescription along with prescribed medications to achieve blood glucose in normal ranges: Fasting glucose 65-99 mg/dL   Expected Outcomes Short Term: Participant verbalizes understanding of the signs/symptoms and immediate care of hyper/hypoglycemia, proper foot care and importance of medication, aerobic/resistive exercise and nutrition plan for blood glucose control.;Long Term: Attainment of HbA1C < 7%.   Hypertension Yes   Intervention Provide education on lifestyle modifcations  including regular physical activity/exercise, weight management, moderate sodium restriction and increased consumption of fresh fruit, vegetables, and low fat dairy, alcohol moderation, and smoking cessation.;Monitor prescription use compliance.   Expected Outcomes Short Term: Continued assessment and intervention until BP is < 140/25mm HG in hypertensive participants. < 130/31mm HG in hypertensive participants with diabetes, heart failure or chronic kidney disease.;Long Term: Maintenance of blood pressure at goal levels.   Lipids Yes   Intervention Provide education and support for participant on nutrition & aerobic/resistive exercise along with prescribed medications to achieve LDL 70mg , HDL >40mg .   Expected Outcomes Short Term: Participant states understanding of desired cholesterol values and is compliant with medications prescribed. Participant is following exercise prescription and nutrition guidelines.;Long Term: Cholesterol controlled with medications as prescribed, with individualized exercise RX and with personalized nutrition plan. Value goals: LDL < 70mg , HDL > 40 mg.   Stress Yes   Intervention Offer individual and/or small group education and counseling on adjustment to heart disease, stress management and health-related lifestyle change. Teach and support self-help strategies.;Refer participants experiencing significant  psychosocial distress to appropriate mental health specialists for further evaluation and treatment. When possible, include family members and significant others in education/counseling sessions.   Expected Outcomes Short Term: Participant demonstrates changes in health-related behavior, relaxation and other stress management skills, ability to obtain effective social support, and compliance with psychotropic medications if prescribed.;Long Term: Emotional wellbeing is indicated by absence of clinically significant psychosocial distress or social isolation.       Personal Goals Discharge:     Goals and Risk Factor Review - 09/16/16 1018      Core Components/Risk Factors/Patient Goals Review   Personal Goals Review Weight Management/Obesity;Sedentary;Increase Strength and Stamina;Diabetes;Lipids;Hypertension   Review Beckie Busing has been doing well in rehab.  His weight has been steady.  He is not checking his blood sugars but has not felt like he has had any swings.  He also has not been checking his blood pressures at home as he needs new batteries for his machine.  He admits to being lazy and not doing his home exercise.  However, he does feel stronger after having come to rehab.  He is going to to think about coming to [employer] and will let us know before he graduates.  He has not had any problems with his medications.   Expected Outcomes Short Term: Beckie Busing is going to replace his batteries in his cuff to montior his blood pressure and watch his diet.  Long Term: Beckie Busing will continue to work on his weight control with diet and exercise.      Nutrition & Weight - Outcomes:     Pre Biometrics - 06/30/16 1415      Pre Biometrics   Height 5\' 9"  (1.753 m)   Weight 157 lb 12.8 oz (71.6 kg)   Waist Circumference 35 inches   Hip Circumference 36.25 inches   Waist to Hip Ratio 0.97 %   BMI (Calculated) 23.4   Single Leg Stand 26.05 seconds         Post Biometrics - 09/11/16 1033       Post   Biometrics   Height 5\' 9"  (1.753 m)   Weight 162 lb (73.5 kg)   Waist Circumference 36.5 inches   Hip Circumference 38.5 inches   Waist to Hip Ratio 0.95 %   BMI (Calculated) 24      Nutrition:     Nutrition Therapy & Goals - 08/07/16 2130  Nutrition Therapy   Diet --  Kei still does not want to meet with the dietician.   Drug/Food Interactions Statins/Certain Fruits     Personal Nutrition Goals   Personal Goal #1 [place] "Beckie Busing" eats healthy he said so cont to eat healthy.       Nutrition Discharge:     Nutrition Assessments - 09/11/16 0846      Rate Your Plate Scores   Pre Score 63   Pre Score % 70 %   Post Score 63   Post Score % 72.4 %   % Change 2.4 %      Education Questionnaire Score:     Knowledge Questionnaire Score - 09/11/16 1033      Knowledge Questionnaire Score   Pre Score 18/28   Post Score 19/28      Goals reviewed with patient; copy given to patient.

## 2016-09-30 ENCOUNTER — Encounter: Payer: Medicare Other | Admitting: *Deleted

## 2016-09-30 DIAGNOSIS — Z955 Presence of coronary angioplasty implant and graft: Secondary | ICD-10-CM

## 2016-09-30 DIAGNOSIS — Z48812 Encounter for surgical aftercare following surgery on the circulatory system: Secondary | ICD-10-CM | POA: Diagnosis not present

## 2016-09-30 NOTE — Progress Notes (Signed)
Cardiac Individual Treatment Plan  Patient Details  Name: Reginald Franklin MRN: [id number] Date of Birth: 1941/03/06 Referring Provider:   Flowsheet Row Cardiac Rehab from 06/30/2016 in [facility] Cardiac and Pulmonary Rehab  Referring Provider  Bartholome Bill MD      Initial Encounter Date:  Flowsheet Row Cardiac Rehab from 06/30/2016 in [facility] Cardiac and Pulmonary Rehab  Date  06/30/16  Referring Provider  Bartholome Bill MD      Visit Diagnosis: S/P coronary artery stent placement  Patient's Home Medications on Admission:  Current Outpatient Prescriptions:  .  aspirin 325 MG tablet, Take 325 mg by mouth at bedtime., Disp: , Rfl:  .  aspirin EC 325 MG EC tablet, Take 1 tablet (325 mg total) by mouth daily., Disp: 30 tablet, Rfl: 0 .  carvedilol (COREG) 6.25 MG tablet, Take 6.25 mg by mouth 2 (two) times daily with a meal., Disp: , Rfl:  .  cetirizine (ZYRTEC) 10 MG tablet, Take 10 mg by mouth at bedtime., Disp: , Rfl:  .  Cholecalciferol 10000 units TABS, Take by mouth., Disp: , Rfl:  .  clopidogrel (PLAVIX) 75 MG tablet, Take 1 tablet (75 mg total) by mouth daily with breakfast., Disp: 30 tablet, Rfl: 11 .  ezetimibe (ZETIA) 10 MG tablet, Take 10 mg by mouth at bedtime., Disp: , Rfl:  .  furosemide (LASIX) 40 MG tablet, Take 40 mg by mouth., Disp: , Rfl:  .  losartan (COZAAR) 50 MG tablet, Take 50 mg by mouth daily., Disp: , Rfl:  .  Lutein 20 MG TABS, Take by mouth., Disp: , Rfl:  .  meloxicam (MOBIC) 7.5 MG tablet, Take 7.5 mg by mouth daily., Disp: , Rfl:  .  metformin (FORTAMET) 1000 MG (OSM) 24 hr tablet, Take 1,000 mg by mouth 2 (two) times daily with a meal., Disp: , Rfl:  .  Misc Natural Products (LUTEIN 20) CAPS, Take by mouth., Disp: , Rfl:  .  Multiple Vitamin (MULTIVITAMIN) tablet, Take 1 tablet by mouth daily., Disp: , Rfl:  .  Multiple Vitamins-Minerals (VISION FORMULA EYE HEALTH PO), Take 1 tablet by mouth daily., Disp: , Rfl:  .  multivitamin-lutein (OCUVITE-LUTEIN) CAPS  capsule, Take 1 capsule by mouth daily., Disp: , Rfl:   Past Medical History: Past Medical History:  Diagnosis Date  . Anginal pain ([place])   . BPH (benign prostatic hyperplasia)   . Coronary artery disease   . Diabetes mellitus without complication [facility])    Patient takes Metformin.  . Erosive gastropathy   . Gastritis   . Hemorrhoids   . Hiatal hernia   . Hyperlipidemia   . Hypertension   . Nasal polyp   . Peripheral vascular disease ([place])   . S/P CABG x 3   . Seasonal allergies   . Stroke [facility])     Tobacco Use: History  Smoking Status  . Never Smoker  Smokeless Tobacco  . Never Used    Labs: Recent Review Flowsheet Data    There is no flowsheet data to display.       Exercise Target Goals:    Exercise Program Goal: Individual exercise prescription set with THRR, safety & activity barriers. Participant demonstrates ability to understand and report RPE using BORG scale, to self-measure pulse accurately, and to acknowledge the importance of the exercise prescription.  Exercise Prescription Goal: Starting with aerobic activity 30 plus minutes a day, 3 days per week for initial exercise prescription. Provide home exercise prescription and guidelines that participant acknowledges  understanding prior to discharge.  Activity Barriers & Risk Stratification:     Activity Barriers & Cardiac Risk Stratification - 06/30/16 1416      Activity Barriers & Cardiac Risk Stratification   Activity Barriers Deconditioning;Muscular Weakness;Other (comment)   Comments Slow getting up from sitting   Cardiac Risk Stratification High      6 Minute Walk:     6 Minute Walk    Row Name 06/30/16 1410 09/11/16 0956       6 Minute Walk   Phase Initial Discharge    Distance 1000 feet 1240 feet    Distance % Change  - 24 %  240 ft    Walk Time 6 minutes 6 minutes    # of Rest Breaks 0 0    MPH 1.89 2.35    METS 2.5 2.98    RPE 9 13    VO2 Peak 8.76 10.46    Symptoms Yes  (comment) No    Comments Felt like he was dragging his right foot  -    Resting HR 76 bpm 79 bpm    Resting BP 142/80 120/64    Max Ex. HR 97 bpm 93 bpm    Max Ex. BP 150/76 172/70    2 Minute Post BP 140/70  -       Initial Exercise Prescription:     Initial Exercise Prescription - 06/30/16 1400      Date of Initial Exercise RX and Referring Provider   Date 06/30/16   Referring Provider Bartholome Bill MD     Treadmill   MPH 1.7   Grade 0.5   Minutes 15   METs 2.45     NuStep   Level 1   Watts --  80-100 spm   Minutes 15   METs 2     Biostep-RELP   Level 1   Watts --  40-50 spm   Minutes 15   METs 2     Prescription Details   Frequency (times per week) 2   Duration Progress to 45 minutes of aerobic exercise without signs/symptoms of physical distress     Intensity   THRR 40-80% of Max Heartrate 104-131   Ratings of Perceived Exertion 11-15   Perceived Dyspnea 0-4     Progression   Progression Continue to progress workloads to maintain intensity without signs/symptoms of physical distress.     Resistance Training   Training Prescription Yes   Weight 2 lbs   Reps 10-12      Perform Capillary Blood Glucose checks as needed.  Exercise Prescription Changes:     Exercise Prescription Changes    Row Name 06/30/16 1400 07/08/16 1400 07/23/16 0900 08/05/16 1000 08/05/16 1500     Exercise Review   Progression -  Walk Test Results Yes Yes  - Yes     Response to Exercise   Blood Pressure (Admit) 142/80 132/70 132/60  - 124/60   Blood Pressure (Exercise) 150/76 136/64 138/72  - 130/70   Blood Pressure (Exit) 140/70 130/74 122/80  - 126/76   Heart Rate (Admit) 76 bpm 81 bpm 65 bpm  - 79 bpm   Heart Rate (Exercise) 97 bpm 91 bpm 87 bpm  - 96 bpm   Heart Rate (Exit) 80 bpm 78 bpm 62 bpm  - 78 bpm   Oxygen Saturation (Admit) 96 %  -  -  -  -   Oxygen Saturation (Exit) 97 %  -  -  -  -  Rating of Perceived Exertion (Exercise) _0 - 12   Symptoms  dragged right foot none none none none   Comments  -  -  - Home Exercise Guidelines given 08/05/16 Home Exercise Guidelines given 08/05/16   Duration  - Progress to 45 minutes of aerobic exercise without signs/symptoms of physical distress Progress to 45 minutes of aerobic exercise without signs/symptoms of physical distress Progress to 45 minutes of aerobic exercise without signs/symptoms of physical distress Progress to 45 minutes of aerobic exercise without signs/symptoms of physical distress   Intensity  - THRR unchanged THRR unchanged THRR unchanged THRR unchanged     Progression   Progression  - Continue to progress workloads to maintain intensity without signs/symptoms of physical distress. Continue to progress workloads to maintain intensity without signs/symptoms of physical distress. Continue to progress workloads to maintain intensity without signs/symptoms of physical distress. Continue to progress workloads to maintain intensity without signs/symptoms of physical distress.   Average METs  - 2.12 2.12 2.12 2.23     Resistance Training   Training Prescription  - Yes Yes Yes Yes   Weight  - 3 lbs 3 lbs 3 lbs 3 lbs   Reps  - 10-12 10-12 10-12 10-12     Interval Training   Interval Training  - No No No No     Treadmill   MPH  - 1.4 1.4 1.4 1.4   Grade  - 0.5 0.5 0.5 0.5   Minutes  - _1 METs  - 2.27 2.27 2.27 2.27     NuStep   Level  - _2 Minutes  - _3 METs  - 2.1 2.1 2.1 2.2     Biostep-RELP   Level  - _4 Minutes  - _5 METs  - _6 Home Exercise Plan   Plans to continue exercise at  -  -  - Home  walking Home  walking   Frequency  -  -  - Add 3 additional days to program exercise sessions. Add 3 additional days to program exercise sessions.   Row Name 08/20/16 1500 09/02/16 1500 09/16/16 1500         Exercise Review   Progression Yes Yes Yes       Response to Exercise   Blood Pressure (Admit) 146/60 174/74   rck 132/74 130/80     Blood Pressure (Exercise) 134/66 144/80 146/88     Blood Pressure (Exit) 108/60 106/56 128/60     Heart Rate (Admit) 78 bpm 79 bpm 87 bpm     Heart Rate (Exercise) 91 bpm 94 bpm 103 bpm     Heart Rate (Exit) 77 bpm 78 bpm 81 bpm     Rating of Perceived Exertion (Exercise) _7 Symptoms none none none     Comments Home Exercise Guidelines given 08/05/16 Home Exercise Guidelines given 08/05/16 Home Exercise Guidelines given 08/05/16     Duration Progress to 45 minutes of aerobic exercise without signs/symptoms of physical distress Progress to 45 minutes of aerobic exercise without signs/symptoms of physical distress Progress to 45 minutes of aerobic exercise without signs/symptoms of physical distress     Intensity THRR unchanged THRR unchanged THRR unchanged       Progression   Progression Continue to progress workloads to maintain intensity  without signs/symptoms of physical distress. Continue to progress workloads to maintain intensity without signs/symptoms of physical distress. Continue to progress workloads to maintain intensity without signs/symptoms of physical distress.     Average METs 2.16 2.09 2.09       Resistance Training   Training Prescription Yes Yes Yes     Weight 3 lbs 3 lbs 3 lbs     Reps 10-12 10-12 10-12       Interval Training   Interval Training No No No       Treadmill   MPH 1.4 1.4 1.4     Grade 0.5 0.5 0.5     Minutes _0 METs 2.27 2.27 2.27       NuStep   Level _1 Minutes _2 METs 2.2 2 2.3       Biostep-RELP   Level _3 Minutes _4 METs _5 Home Exercise Plan   Plans to continue exercise at Home  walking Home  walking Home  walking     Frequency Add 3 additional days to program exercise sessions. Add 3 additional days to program exercise sessions. Add 3 additional days to program exercise sessions.  but not actually doing anything        Exercise Comments:      Exercise Comments    Row Name 06/30/16 1414 07/03/16 1001 07/08/16 1453 07/23/16 0943 08/05/16 1054   Exercise Comments Exercise goal is to be to walk faster and easier. First full day of exercise!  Patient was oriented to gym and equipment including functions, settings, policies, and procedures.  Patient's individual exercise prescription and treatment plan were reviewed.  All starting workloads were established based on the results of the 6 minute walk test done at initial orientation visit.  The plan for exercise progression was also introduced and progression will be customized based on patient's performance and goals. Obie Dredge is off to a good start with exercise.  We will continue to monitor his progression. Obie Dredge is doing well with exercise.  [place].  We will continue to monitor his progression. Reviewed home exercise with pt today.  Pt plans to walking at home for exercise.  Reviewed THR, pulse, RPE, sign and symptoms, and when to call 911 or MD.  Also discussed weather considerations and indoor options.  Pt voiced understanding.   [place] Name 08/05/16 1551 08/20/16 1507 09/02/16 1041 09/16/16 1526 09/30/16 1022   Exercise Comments [name] continues to do well with exercise.  We will continue to monitor his progress. Obie Dredge is out this week on vacation.  He has been doing well with exercise.  We will continue to track his progression. [name]'s weight was up six pounds from last week.  His blood pressure was also elevated at check in.  He denied any symptoms and blood pressure returned to normal levels after education and at check out.  We will continue to monitor his blood pressure and his progression. Obie Dredge will be graduating next week!  He improved his walk test by 24%!!  He is considering coming to [employer] as he admits to being lazy and not exercising at home. Jerimah graduated today from cardiac rehab with 36 sessions completed.  Details of the patient's exercise prescription and  what He needs  to do in order to continue the prescription and progress were discussed with patient.  Patient was given a copy of prescription and goals.  Patient verbalized understanding.  Dhillon plans to continue to exercise by walking at home.  He had considered Financial [employer], but currently his wife is not interested and he does not want to come by himself.      Discharge Exercise Prescription (Final Exercise Prescription Changes):     Exercise Prescription Changes - 09/16/16 1500      Exercise Review   Progression Yes     Response to Exercise   Blood Pressure (Admit) 130/80   Blood Pressure (Exercise) 146/88   Blood Pressure (Exit) 128/60   Heart Rate (Admit) 87 bpm   Heart Rate (Exercise) 103 bpm   Heart Rate (Exit) 81 bpm   Rating of Perceived Exertion (Exercise) 13   Symptoms none   Comments Home Exercise Guidelines given 08/05/16   Duration Progress to 45 minutes of aerobic exercise without signs/symptoms of physical distress   Intensity THRR unchanged     Progression   Progression Continue to progress workloads to maintain intensity without signs/symptoms of physical distress.   Average METs 2.09     Resistance Training   Training Prescription Yes   Weight 3 lbs   Reps 10-12     Interval Training   Interval Training No     Treadmill   MPH 1.4   Grade 0.5   Minutes 15   METs 2.27     NuStep   Level 3   Minutes 15   METs 2.3     Biostep-RELP   Level 4   Minutes 15   METs 2     Home Exercise Plan   Plans to continue exercise at Home  walking   Frequency Add 3 additional days to program exercise sessions.  but not actually doing anything      Nutrition:  Target Goals: Understanding of nutrition guidelines, daily intake of sodium <1542m, cholesterol <2073m calories 30% from fat and 7% or less from saturated fats, daily to have 5 or more servings of fruits and vegetables.  Biometrics:     Pre Biometrics - 06/30/16 1415      Pre Biometrics    Height _0  (1.753 m)   Weight 157 lb 12.8 oz (71.6 kg)   Waist Circumference 35 inches   Hip Circumference 36.25 inches   Waist to Hip Ratio 0.97 %   BMI (Calculated) 23.4   Single Leg Stand 26.05 seconds         Post Biometrics - 09/11/16 1033       Post  Biometrics   Height _1  (1.753 m)   Weight 162 lb (73.5 kg)   Waist Circumference 36.5 inches   Hip Circumference 38.5 inches   Waist to Hip Ratio 0.95 %   BMI (Calculated) 24      Nutrition Therapy Plan and Nutrition Goals:     Nutrition Therapy & Goals - 08/07/16 0942      Nutrition Therapy   Diet --  JaMaclanetill does not want to meet with the dietician.   Drug/Food Interactions Statins/Certain Fruits     Personal Nutrition Goals   Personal Goal #1 JaKawanIkObie Dredgeeats healthy he said so cont to eat healthy.       Nutrition Discharge: Rate Your Plate Scores:     Nutrition Assessments - 09/11/16 0846      Rate Your Plate Scores  Pre Score 63   Pre Score % 70 %   Post Score 63   Post Score % 72.4 %   % Change 2.4 %      Nutrition Goals Re-Evaluation:     Nutrition Goals Re-Evaluation    Row Name 08/07/16 0944 09/16/16 1029           Personal Goal #1 Re-Evaluation   Comments Jeneen Rinks "[name]" was asked again today if he wanted to meet individually with the Cardiac Rehab Registered dietician since there is no extra charge but Obie Dredge said he didn't feel the need to .   -        Intervention Plan   Comments  - Obie Dredge continues to decline appointment with dietician.  He says that he is going to eat what he wants either way.  He is being more mindful of his salt in take.         Psychosocial: Target Goals: Acknowledge presence or absence of depression, maximize coping skills, provide positive support system. Participant is able to verbalize types and ability to use techniques and skills needed for reducing stress and depression.  Initial Review & Psychosocial Screening:     Initial Psych Review & Screening  - 06/30/16 1232      Initial Review   Current issues with Current Sleep Concerns     Family Dynamics   Good Support System? Yes     Barriers   Psychosocial barriers to participate in program The patient should benefit from training in stress management and relaxation.     Screening Interventions   Interventions Encouraged to exercise      Quality of Life Scores:     Quality of Life - 09/11/16 0846      Quality of Life Scores   Health/Function Pre 24.86 %   Health/Function Post 18.61 %   Health/Function % Change -25.14 %   Socioeconomic Pre 26 %   Socioeconomic Post 15.83 %   Socioeconomic % Change  -39.12 %   Psych/Spiritual Pre 24 %   Psych/Spiritual Post 22.57 %   Psych/Spiritual % Change -5.96 %   Family Pre 27 %   Family Post 14.88 %   Family % Change -44.89 %   GLOBAL Pre 28.16 %   GLOBAL Post 18.48 %   GLOBAL % Change -34.38 %      PHQ-9: Recent Review Flowsheet Data    Depression screen [facility] 2/9 09/11/2016 06/30/2016   Decreased Interest 2 0   Down, Depressed, Hopeless 2 0   PHQ - 2 Score 4 0   Altered sleeping 1 1   Tired, decreased energy 1 1   Change in appetite 1 0   Feeling bad or failure about yourself  0 0   Trouble concentrating 1 0   Moving slowly or fidgety/restless 2 0   Suicidal thoughts 0 0   PHQ-9 Score 10 2   Difficult doing work/chores Not difficult at all Not difficult at all      Psychosocial Evaluation and Intervention:     Psychosocial Evaluation - 09/11/16 0936      Discharge Psychosocial Assessment & Intervention   Comments Counselor met with Mr. Johnette Abraham today as a follow up prior to discharge.  Exercise physiologist brought to Counselor's attention that Mr. Loraine Grip Quality of Life Scores had decreased since coming into the program as well as his PHQ-9 score increased - possibily indicating some depression is present.  Counselor questioned Mr. Johnette Abraham about these scores and he  reports his spouse is depressed and she completed the packet of  all of this information for him.  Counselor reviewed the PHQ-9 and the scores went from a 15 indicating moderate depression to a 10 indicating some mild depression may be present.  Mr. Johnette Abraham states that his wife's decline in health has impacted him in that she is unable to get up and get out to do things which keeps him more sedentary to be there with her.  He reports "not much" difference in his stamina and strength since coming into this program; but he has enjoyed getting up and getting out of the house to be with other people more than the exercise itself.  Counselor discussed Mr. E speaking with his Dr. about his depressive symptoms and to consider a follow - up exercise program outside the home to continue consistency in exercise and to decrease feelings of isolation and hopelessness.          Psychosocial Re-Evaluation:     Psychosocial Re-Evaluation    [place] Name 08/07/16 0940             Psychosocial Re-Evaluation   Interventions Encouraged to attend Cardiac Rehabilitation for the exercise       Comments Harjot "Obie Dredge" said his wife can't exercise or walk much with him since she has foot and back problems. She had a heart stent placed a year ago. [name] said he likes to be outside better rather than in a gym although "it is ok and other people are exercising around  him". I mentioned [employer] exercise program to him.          Vocational Rehabilitation: Provide vocational rehab assistance to qualifying candidates.   Vocational Rehab Evaluation & Intervention:     Vocational Rehab - 06/30/16 1133      Initial Vocational Rehab Evaluation & Intervention   Assessment shows need for Vocational Rehabilitation (P)  No      Education: Education Goals: Education classes will be provided on a weekly basis, covering required topics. Participant will state understanding/return demonstration of topics presented.  Learning Barriers/Preferences:     Learning Barriers/Preferences - 06/30/16  1419      Learning Barriers/Preferences   Learning Barriers Sight   Learning Preferences None      Education Topics: General Nutrition Guidelines/Fats and Fiber: -Group instruction provided by verbal, written material, models and posters to present the general guidelines for heart healthy nutrition. Gives an explanation and review of dietary fats and fiber. Flowsheet Row Cardiac Rehab from 09/30/2016 in [facility] Cardiac and Pulmonary Rehab  Date  09/30/16  Educator  PI  Instruction Review Code  2- meets goals/outcomes      Controlling Sodium/Reading Food Labels: -Group verbal and written material supporting the discussion of sodium use in heart healthy nutrition. Review and explanation with models, verbal and written materials for utilization of the food label. Flowsheet Row Cardiac Rehab from 09/30/2016 in [facility] Cardiac and Pulmonary Rehab  Date  08/12/16  Educator  PI  Instruction Review Code  2- meets goals/outcomes      Exercise Physiology & Risk Factors: - Group verbal and written instruction with models to review the exercise physiology of the cardiovascular system and associated critical values. Details cardiovascular disease risk factors and the goals associated with each risk factor. Flowsheet Row Cardiac Rehab from 09/30/2016 in [facility] Cardiac and Pulmonary Rehab  Date  07/31/16  Educator  [facility]  Instruction Review Code  2- meets goals/outcomes  Aerobic Exercise & Resistance Training: - Gives group verbal and written discussion on the health impact of inactivity. On the components of aerobic and resistive training programs and the benefits of this training and how to safely progress through these programs.   Flexibility, Balance, General Exercise Guidelines: - Provides group verbal and written instruction on the benefits of flexibility and balance training programs. Provides general exercise guidelines with specific guidelines to those with heart or lung disease. Demonstration  and skill practice provided.   Stress Management: - Provides group verbal and written instruction about the health risks of elevated stress, cause of high stress, and healthy ways to reduce stress. Flowsheet Row Cardiac Rehab from 09/30/2016 in [facility] Cardiac and Pulmonary Rehab  Date  09/04/16  Educator  TS  Instruction Review Code  2- meets goals/outcomes      Depression: - Provides group verbal and written instruction on the correlation between heart/lung disease and depressed mood, treatment options, and the stigmas associated with seeking treatment. Flowsheet Row Cardiac Rehab from 09/30/2016 in [facility] Cardiac and Pulmonary Rehab  Date  08/07/16  Educator  CE  Instruction Review Code  2- meets goals/outcomes      Anatomy & Physiology of the Heart: - Group verbal and written instruction and models provide basic cardiac anatomy and physiology, with the coronary electrical and arterial systems. Review of: AMI, Angina, Valve disease, Heart Failure, Cardiac Arrhythmia, Pacemakers, and the ICD. Flowsheet Row Cardiac Rehab from 09/30/2016 in [facility] Cardiac and Pulmonary Rehab  Date  08/26/16  Educator  SB  Instruction Review Code  2- meets goals/outcomes      Cardiac Procedures: - Group verbal and written instruction and models to describe the testing methods done to diagnose heart disease. Reviews the outcomes of the test results. Describes the treatment choices: Medical Management, Angioplasty, or Coronary Bypass Surgery. Flowsheet Row Cardiac Rehab from 09/30/2016 in [facility] Cardiac and Pulmonary Rehab  Date  09/02/16  Educator  SB  Instruction Review Code  2- meets goals/outcomes      Cardiac Medications: - Group verbal and written instruction to review commonly prescribed medications for heart disease. Reviews the medication, class of the drug, and side effects. Includes the steps to properly store meds and maintain the prescription regimen. Flowsheet Row Cardiac Rehab from 09/30/2016 in  [facility] Cardiac and Pulmonary Rehab  Date  09/11/16  Educator  TS  Instruction Review Code  2- meets goals/outcomes      Go Sex-Intimacy & Heart Disease, Get SMART - Goal Setting: - Group verbal and written instruction through game format to discuss heart disease and the return to sexual intimacy. Provides group verbal and written material to discuss and apply goal setting through the application of the S.M.A.R.T. Method. Flowsheet Row Cardiac Rehab from 09/30/2016 in [facility] Cardiac and Pulmonary Rehab  Date  09/02/16  Educator  SB  Instruction Review Code  2- meets goals/outcomes      Other Matters of the Heart: - Provides group verbal, written materials and models to describe Heart Failure, Angina, Valve Disease, and Diabetes in the realm of heart disease. Includes description of the disease process and treatment options available to the cardiac patient. Flowsheet Row Cardiac Rehab from 09/30/2016 in [facility]  Date  07/08/16  Educator  SB  Instruction Review Code  2- meets goals/outcomes      Exercise & Equipment Safety: - Individual verbal instruction and demonstration of equipment use and safety with use of the equipment.   Infection  Prevention: - Provides verbal and written material to individual with discussion of infection control including proper hand washing and proper equipment cleaning during exercise session.   Falls Prevention: - Provides verbal and written material to individual with discussion of falls prevention and safety.   Diabetes: - Individual verbal and written instruction to review signs/symptoms of diabetes, desired ranges of glucose level fasting, after meals and with exercise. Advice that pre and post exercise glucose checks will be done for 3 sessions at entry of program.    Knowledge Questionnaire Score:     Knowledge Questionnaire Score - 09/11/16 1033      Knowledge Questionnaire Score   Pre Score 18/28   Post Score 19/28       Core Components/Risk Factors/Patient Goals at Admission:     Personal Goals and Risk Factors at Admission - 06/30/16 1231      Core Components/Risk Factors/Patient Goals on Admission    Weight Management Yes;Weight Maintenance   Intervention Weight Management: Develop a combined nutrition and exercise program designed to reach desired caloric intake, while maintaining appropriate intake of nutrient and fiber, sodium and fats, and appropriate energy expenditure required for the weight goal.;Weight Management: Provide education and appropriate resources to help participant work on and attain dietary goals.   Expected Outcomes Weight Maintenance: Understanding of the daily nutrition guidelines, which includes 25-35% calories from fat, 7% or less cal from saturated fats, less than 279m cholesterol, less than 1.5gm of sodium, & 5 or more servings of fruits and vegetables daily   Sedentary Yes   Intervention Provide advice, education, support and counseling about physical activity/exercise needs.;Develop an individualized exercise prescription for aerobic and resistive training based on initial evaluation findings, risk stratification, comorbidities and participant's personal goals.   Expected Outcomes Achievement of increased cardiorespiratory fitness and enhanced flexibility, muscular endurance and strength shown through measurements of functional capacity and personal statement of participant.   Increase Strength and Stamina Yes   Intervention Provide advice, education, support and counseling about physical activity/exercise needs.;Develop an individualized exercise prescription for aerobic and resistive training based on initial evaluation findings, risk stratification, comorbidities and participant's personal goals.   Expected Outcomes Achievement of increased cardiorespiratory fitness and enhanced flexibility, muscular endurance and strength shown through measurements of functional capacity  and personal statement of participant.   Diabetes Yes   Intervention Provide education about signs/symptoms and action to take for hypo/hyperglycemia.;Provide education about proper nutrition, including hydration, and aerobic/resistive exercise prescription along with prescribed medications to achieve blood glucose in normal ranges: Fasting glucose 65-99 mg/dL   Expected Outcomes Short Term: Participant verbalizes understanding of the signs/symptoms and immediate care of hyper/hypoglycemia, proper foot care and importance of medication, aerobic/resistive exercise and nutrition plan for blood glucose control.;Long Term: Attainment of HbA1C < 7%.   Hypertension Yes   Intervention Provide education on lifestyle modifcations including regular physical activity/exercise, weight management, moderate sodium restriction and increased consumption of fresh fruit, vegetables, and low fat dairy, alcohol moderation, and smoking cessation.;Monitor prescription use compliance.   Expected Outcomes Short Term: Continued assessment and intervention until BP is < 140/917mHG in hypertensive participants. < 130/8084mG in hypertensive participants with diabetes, heart failure or chronic kidney disease.;Long Term: Maintenance of blood pressure at goal levels.   Lipids Yes   Intervention Provide education and support for participant on nutrition & aerobic/resistive exercise along with prescribed medications to achieve LDL <76m57mDL >40mg28mExpected Outcomes Short Term: Participant states understanding of desired cholesterol values and  is compliant with medications prescribed. Participant is following exercise prescription and nutrition guidelines.;Long Term: Cholesterol controlled with medications as prescribed, with individualized exercise RX and with personalized nutrition plan. Value goals: LDL < 36m, HDL > 40 mg.   Stress Yes   Intervention Offer individual and/or small group education and counseling on adjustment to  heart disease, stress management and health-related lifestyle change. Teach and support self-help strategies.;Refer participants experiencing significant psychosocial distress to appropriate mental health specialists for further evaluation and treatment. When possible, include family members and significant others in education/counseling sessions.   Expected Outcomes Short Term: Participant demonstrates changes in health-related behavior, relaxation and other stress management skills, ability to obtain effective social support, and compliance with psychotropic medications if prescribed.;Long Term: Emotional wellbeing is indicated by absence of clinically significant psychosocial distress or social isolation.      Core Components/Risk Factors/Patient Goals Review:      Goals and Risk Factor Review    Row Name 07/15/16 1024 08/07/16 0937 09/16/16 1018         Core Components/Risk Factors/Patient Goals Review   Personal Goals Review Weight Management/Obesity;Sedentary;Increase Strength and Stamina;Diabetes;Lipids;Hypertension  - Weight Management/Obesity;Sedentary;Increase Strength and Stamina;Diabetes;Lipids;Hypertension     Review IObie Dredgeis off to a good start with rehab.  He is starting to feel a little bit better and gaining some strength back.  He is still struggling to get up off the floor, but he says that it has gotten better.  He is not exercising at home yet, but we will talk about this soon.  His weight is holding steady for now.  He has not been checking his blood sugars as he is currently out of lancets.  His blood pressures have been good here, but he does not check them at home.  He is intolerant to statins and has not had a recent lipid check.  He feels that his stress levels have started to go down. [name] said the MD did not tell him to check his blood sugars. IObie Dredgesaid the only reason he had lancets to check his blood sugars was that he was in a a research study that he is not in now. [name]  feels Cardiac Rehab is helping some but he likes to do things outside.  IObie Dredgehas been doing well in rehab.  His weight has been steady.  He is not checking his blood sugars but has not felt like he has had any swings.  He also has not been checking his blood pressures at home as he needs new batteries for his machine.  He admits to being lazy and not doing his home exercise.  However, he does feel stronger after having come to rehab.  [facility].  He has not had any problems with his medications.     Expected Outcomes IObie Dredgewill continue to come to exercise and education classes to work on risk factor modifications. Heart healthy lifestyle.  Short Term: IObie Dredgeis going to replace his batteries in his cuff to montior his blood pressure and watch his diet.  Long Term: IObie Dredgewill continue to work on his weight control with diet and exercise.        Core Components/Risk Factors/Patient Goals at Discharge (Final Review):      Goals and Risk Factor Review - 09/16/16 1018      Core Components/Risk Factors/Patient Goals Review   Personal Goals Review Weight  Management/Obesity;Sedentary;Increase Strength and Stamina;Diabetes;Lipids;Hypertension   Review Obie Dredge has been doing well in rehab.  His weight has been steady.  He is not checking his blood sugars but has not felt like he has had any swings.  He also has not been checking his blood pressures at home as he needs new batteries for his machine.  He admits to being lazy and not doing his home exercise.  However, he does feel stronger after having come to rehab.  He is going to to think about coming to [facility] and will let us know before he graduates.  He has not had any problems with his medications.   Expected Outcomes Short Term: Obie Dredge is going to replace his batteries in his cuff to montior his blood pressure and watch his diet.  Long Term: Obie Dredge will continue to work on his weight  control with diet and exercise.      ITP Comments:     ITP Comments    Row Name 06/30/16 1234 06/30/16 1316 07/02/16 0645 07/30/16 0657 08/07/16 0937   ITP Comments "[name]" Jeneen Rinks said he has always had problems with his cholestrol being high no matter what he eats. We discussed low saturated fat diet helps as a lower cholestrol diet since he already limits his eggs/week. I made him and his wife an individual appt with the  Cardiac REhab REgistered  [place].  ITP Created during Medical Review. Documntation of diagnosis in [facility] 06/10/2016. 30 day review. Continue with ITP unless changes noted by Medical Director at signature of review. New to program 30 day review completed for Medical Director physician review and signature. Continue ITP unless changes made by physician. Omair "Obie Dredge" said he is ok exercising wtih people around him but wishes his wife could exercise with him but she has feet and back problems.    [place] Name 08/27/16 0556 09/23/16 0616         ITP Comments 30 day review completed for review by Dr Emily Filbert.  Continue with ITP unless changes noted by Dr Sabra Heck. 30 day review. Continue with ITP unless directed changes per Medical Director review.         Comments: Discharge ITP

## 2016-09-30 NOTE — Progress Notes (Signed)
Daily Session Note  Patient Details  Name: Reginald Franklin MRN: [id number] Date of Birth: Oct 09, 1940 Referring Provider:   Flowsheet Row Cardiac Rehab from 06/30/2016 in [facility] Cardiac [facility] Rehab  Referring Provider  Bartholome Bill MD      Encounter Date: 09/30/2016  Check In:     Session Check In - 09/30/16 0811      Check-In   Location [facility]-Cardiac & Pulmonary Rehab   Staff Present Alberteen Sam, MA, ACSM RCEP, Exercise Physiologist;Susanne Bice, RN, BSN, CCRP;Laureen Owens Shark, BS, RRT, Respiratory Therapist   Supervising physician immediately available to respond to emergencies See telemetry face sheet for immediately available ER MD   Medication changes reported     No   Fall or balance concerns reported    No   Warm-up and Cool-down Performed on first and last piece of equipment   Resistance Training Performed Yes   VAD Patient? No     Pain Assessment   Currently in Pain? No/denies   Multiple Pain Sites No         Goals Met:  Independence with exercise equipment Exercise tolerated well Personal goals reviewed No report of cardiac concerns or symptoms Strength training completed today  Goals Unmet:  Not Applicable  Comments:  Reginald Franklin graduated today from cardiac rehab with 36 sessions completed.  Details of the patient's exercise prescription and what Reginald Franklin needs to do in order to continue the prescription and progress were discussed with patient.  Patient was given a copy of prescription and goals.  Patient verbalized understanding.  Reginald Franklin plans to continue to exercise by walking at home.  Reginald Franklin had considered [facility], but currently his wife is not interested and Reginald Franklin does not want to come by himself.    Dr. Emily Filbert is Medical Director for [place] [facility].

## 2016-09-30 NOTE — Progress Notes (Signed)
Discharge Summary  Patient Details  Name: Reginald Franklin MRN: [id number] Date of Birth: [birth year] Referring Provider:   Flowsheet Row Cardiac Rehab from 06/30/2016 in [facility] Cardiac and Pulmonary Rehab  Referring Provider  Harold Hedge MD       Number of Visits: 36  Reason for Discharge:  Patient reached a stable level of exercise. Patient independent in their exercise.  Smoking History:  History  Smoking Status  . Never Smoker  Smokeless Tobacco  . Never Used    Diagnosis:  S/P coronary artery stent placement  ADL UCSD:   Initial Exercise Prescription:     Initial Exercise Prescription - 06/30/16 1400      Date of Initial Exercise RX and Referring Provider   Date 06/30/16   Referring Provider Harold Hedge MD     Treadmill   MPH 1.7   Grade 0.5   Minutes 15   METs 2.45     NuStep   Level 1   Watts --  80-100 spm   Minutes 15   METs 2     Biostep-RELP   Level 1   Watts --  40-50 spm   Minutes 15   METs 2     Prescription Details   Frequency (times per week) 2   Duration Progress to 45 minutes of aerobic exercise without signs/symptoms of physical distress     Intensity   THRR 40-80% of Max Heartrate 104-131   Ratings of Perceived Exertion 11-15   Perceived Dyspnea 0-4     Progression   Progression Continue to progress workloads to maintain intensity without signs/symptoms of physical distress.     Resistance Training   Training Prescription Yes   Weight 2 lbs   Reps 10-12      Discharge Exercise Prescription (Final Exercise Prescription Changes):     Exercise Prescription Changes - 09/16/16 1500      Exercise Review   Progression Yes     Response to Exercise   Blood Pressure (Admit) 130/80   Blood Pressure (Exercise) 146/88   Blood Pressure (Exit) 128/60   Heart Rate (Admit) 87 bpm   Heart Rate (Exercise) 103 bpm   Heart Rate (Exit) 81 bpm   Rating of Perceived Exertion (Exercise) 13   Symptoms none   Comments Home  Exercise Guidelines given 08/05/16   Duration Progress to 45 minutes of aerobic exercise without signs/symptoms of physical distress   Intensity THRR unchanged     Progression   Progression Continue to progress workloads to maintain intensity without signs/symptoms of physical distress.   Average METs 2.09     Resistance Training   Training Prescription Yes   Weight 3 lbs   Reps 10-12     Interval Training   Interval Training No     Treadmill   MPH 1.4   Grade 0.5   Minutes 15   METs 2.27     NuStep   Level 3   Minutes 15   METs 2.3     Biostep-RELP   Level 4   Minutes 15   METs 2     Home Exercise Plan   Plans to continue exercise at Home  walking   Frequency Add 3 additional days to program exercise sessions.  but not actually doing anything      Functional Capacity:     6 Minute Walk    Row Name 06/30/16 1410 09/11/16 0956       6 Minute Walk   Phase Initial  Discharge    Distance 1000 feet 1240 feet    Distance % Change  - 24 %  240 ft    Walk Time 6 minutes 6 minutes    # of Rest Breaks 0 0    MPH 1.89 2.35    METS 2.5 2.98    RPE 9 13    VO2 Peak 8.76 10.46    Symptoms Yes (comment) No    Comments Felt like he was dragging his right foot  -    Resting HR 76 bpm 79 bpm    Resting BP 142/80 120/64    Max Ex. HR 97 bpm 93 bpm    Max Ex. BP 150/76 172/70    2 Minute Post BP 140/70  -       Psychological, QOL, Others - Outcomes: PHQ 2/9: [facility] 09/11/2016 06/30/2016  Decreased Interest 2 0  Down, Depressed, Hopeless 2 0  PHQ - 2 Score 4 0  Altered sleeping 1 1  Tired, decreased energy 1 1  Change in appetite 1 0  Feeling bad or failure about yourself  0 0  Trouble concentrating 1 0  Moving slowly or fidgety/restless 2 0  Suicidal thoughts 0 0  PHQ-9 Score 10 2  Difficult doing work/chores Not difficult at all Not difficult at all    Quality of Life:     Quality of Life - 09/11/16 0846      Quality of Life  Scores   Health/Function Pre 24.86 %   Health/Function Post 18.61 %   Health/Function % Change -25.14 %   Socioeconomic Pre 26 %   Socioeconomic Post 15.83 %   Socioeconomic % Change  -39.12 %   Psych/Spiritual Pre 24 %   Psych/Spiritual Post 22.57 %   Psych/Spiritual % Change -5.96 %   Family Pre 27 %   Family Post 14.88 %   Family % Change -44.89 %   GLOBAL Pre 28.16 %   GLOBAL Post 18.48 %   GLOBAL % Change -34.38 %      Personal Goals: Goals established at orientation with interventions provided to work toward goal.     Personal Goals and Risk Factors at Admission - 06/30/16 1231      Core Components/Risk Factors/Patient Goals on Admission    Weight Management Yes;Weight Maintenance   Intervention Weight Management: Develop a combined nutrition and exercise program designed to reach desired caloric intake, while maintaining appropriate intake of nutrient and fiber, sodium and fats, and appropriate energy expenditure required for the weight goal.;Weight Management: Provide education and appropriate resources to help participant work on and attain dietary goals.   Expected Outcomes Weight Maintenance: Understanding of the daily nutrition guidelines, which includes 25-35% calories from fat, 7% or less cal from saturated fats, less than 200mg  cholesterol, less than 1.5gm of sodium, & 5 or more servings of fruits and vegetables daily   Sedentary Yes   Intervention Provide advice, education, support and counseling about physical activity/exercise needs.;Develop an individualized exercise prescription for aerobic and resistive training based on initial evaluation findings, risk stratification, comorbidities and participant's personal goals.   Expected Outcomes Achievement of increased cardiorespiratory fitness and enhanced flexibility, muscular endurance and strength shown through measurements of functional capacity and personal statement of participant.   Increase Strength and Stamina  Yes   Intervention Provide advice, education, support and counseling about physical activity/exercise needs.;Develop an individualized exercise prescription for aerobic and resistive training based on initial evaluation findings, risk stratification, comorbidities  and participant's personal goals.   Expected Outcomes Achievement of increased cardiorespiratory fitness and enhanced flexibility, muscular endurance and strength shown through measurements of functional capacity and personal statement of participant.   Diabetes Yes   Intervention Provide education about signs/symptoms and action to take for hypo/hyperglycemia.;Provide education about proper nutrition, including hydration, and aerobic/resistive exercise prescription along with prescribed medications to achieve blood glucose in normal ranges: Fasting glucose 65-99 mg/dL   Expected Outcomes Short Term: Participant verbalizes understanding of the signs/symptoms and immediate care of hyper/hypoglycemia, proper foot care and importance of medication, aerobic/resistive exercise and nutrition plan for blood glucose control.;Long Term: Attainment of HbA1C < 7%.   Hypertension Yes   Intervention Provide education on lifestyle modifcations including regular physical activity/exercise, weight management, moderate sodium restriction and increased consumption of fresh fruit, vegetables, and low fat dairy, alcohol moderation, and smoking cessation.;Monitor prescription use compliance.   Expected Outcomes Short Term: Continued assessment and intervention until BP is < 140/74mm HG in hypertensive participants. < 130/82mm HG in hypertensive participants with diabetes, heart failure or chronic kidney disease.;Long Term: Maintenance of blood pressure at goal levels.   Lipids Yes   Intervention Provide education and support for participant on nutrition & aerobic/resistive exercise along with prescribed medications to achieve LDL 70mg , HDL >40mg .   Expected  Outcomes Short Term: Participant states understanding of desired cholesterol values and is compliant with medications prescribed. Participant is following exercise prescription and nutrition guidelines.;Long Term: Cholesterol controlled with medications as prescribed, with individualized exercise RX and with personalized nutrition plan. Value goals: LDL < 70mg , HDL > 40 mg.   Stress Yes   Intervention Offer individual and/or small group education and counseling on adjustment to heart disease, stress management and health-related lifestyle change. Teach and support self-help strategies.;Refer participants experiencing significant psychosocial distress to appropriate mental health specialists for further evaluation and treatment. When possible, include family members and significant others in education/counseling sessions.   Expected Outcomes Short Term: Participant demonstrates changes in health-related behavior, relaxation and other stress management skills, ability to obtain effective social support, and compliance with psychotropic medications if prescribed.;Long Term: Emotional wellbeing is indicated by absence of clinically significant psychosocial distress or social isolation.       Personal Goals Discharge:     Goals and Risk Factor Review    Row Name 07/15/16 1024 08/07/16 1610 09/16/16 1018         Core Components/Risk Factors/Patient Goals Review   Personal Goals Review Weight Management/Obesity;Sedentary;Increase Strength and Stamina;Diabetes;Lipids;Hypertension  - Weight Management/Obesity;Sedentary;Increase Strength and Stamina;Diabetes;Lipids;Hypertension     Review Beckie Busing is off to a good start with rehab.  He is starting to feel a little bit better and gaining some strength back.  He is still struggling to get up off the floor, but he says that it has gotten better.  He is not exercising at home yet, but we will talk about this soon.  His weight is holding steady for now.  He has not  been checking his blood sugars as he is currently out of lancets.  His blood pressures have been good here, but he does not check them at home.  He is intolerant to statins and has not had a recent lipid check.  He feels that his stress levels have started to go down. [name] said the MD did not tell him to check his blood sugars. Beckie Busing said the only reason he had lancets to check his blood sugars was that he was in a a research  study that he is not in now. [name] feels Cardiac Rehab is helping some but he likes to do things outside.  Beckie Busing has been doing well in rehab.  His weight has been steady.  He is not checking his blood sugars but has not felt like he has had any swings.  He also has not been checking his blood pressures at home as he needs new batteries for his machine.  He admits to being lazy and not doing his home exercise.  However, he does feel stronger after having come to rehab.  He is going to to think about coming to [employer] and will let us know before he graduates.  He has not had any problems with his medications.     Expected Outcomes Beckie Busing will continue to come to exercise and education classes to work on risk factor modifications. Heart healthy lifestyle.  Short Term: Beckie Busing is going to replace his batteries in his cuff to montior his blood pressure and watch his diet.  Long Term: Beckie Busing will continue to work on his weight control with diet and exercise.        Nutrition & Weight - Outcomes:     Pre Biometrics - 06/30/16 1415      Pre Biometrics   Height 5\' 9"  (1.753 m)   Weight 157 lb 12.8 oz (71.6 kg)   Waist Circumference 35 inches   Hip Circumference 36.25 inches   Waist to Hip Ratio 0.97 %   BMI (Calculated) 23.4   Single Leg Stand 26.05 seconds         Post Biometrics - 09/11/16 1033       Post  Biometrics   Height 5\' 9"  (1.753 m)   Weight 162 lb (73.5 kg)   Waist Circumference 36.5 inches   Hip Circumference 38.5 inches   Waist to Hip Ratio 0.95 %   BMI (Calculated) 24       Nutrition:     Nutrition Therapy & Goals - 08/07/16 0942      Nutrition Therapy   Diet --  Dee still does not want to meet with the dietician.   Drug/Food Interactions Statins/Certain Fruits     Personal Nutrition Goals   Personal Goal #1 Dredyn "Beckie Busing" eats healthy he said so cont to eat healthy.       Nutrition Discharge:     Nutrition Assessments - 09/11/16 0846      Rate Your Plate Scores   Pre Score 63   Pre Score % 70 %   Post Score 63   Post Score % 72.4 %   % Change 2.4 %      Education Questionnaire Score:     Knowledge Questionnaire Score - 09/11/16 1033      Knowledge Questionnaire Score   Pre Score 18/28   Post Score 19/28      Goals reviewed with patient; copy given to patient.

## 2018-06-09 DIAGNOSIS — I509 Heart failure, unspecified: Secondary | ICD-10-CM | POA: Insufficient documentation

## 2018-08-10 DIAGNOSIS — N183 Chronic kidney disease, stage 3 unspecified: Secondary | ICD-10-CM | POA: Insufficient documentation

## 2018-08-24 ENCOUNTER — Inpatient Hospital Stay
Admission: EM | Admit: 2018-08-24 | Discharge: 2018-08-26 | DRG: 246 | Disposition: A | Discharge status: Home / Self Care | Payer: Medicare Other | Attending: Internal Medicine | Admitting: Internal Medicine

## 2018-08-24 ENCOUNTER — Emergency Department: Payer: Medicare Other

## 2018-08-24 ENCOUNTER — Encounter
Admission: EM | Disposition: A | Discharge status: Home / Self Care | Payer: Self-pay | Source: Home / Self Care | Attending: Internal Medicine

## 2018-08-24 ENCOUNTER — Other Ambulatory Visit: Payer: Self-pay

## 2018-08-24 ENCOUNTER — Encounter: Payer: Self-pay | Admitting: *Deleted

## 2018-08-24 DIAGNOSIS — J9600 Acute respiratory failure, unspecified whether with hypoxia or hypercapnia: Secondary | ICD-10-CM | POA: Diagnosis present

## 2018-08-24 DIAGNOSIS — E1151 Type 2 diabetes mellitus with diabetic peripheral angiopathy without gangrene: Secondary | ICD-10-CM | POA: Diagnosis present

## 2018-08-24 DIAGNOSIS — I2510 Atherosclerotic heart disease of native coronary artery without angina pectoris: Secondary | ICD-10-CM | POA: Diagnosis present

## 2018-08-24 DIAGNOSIS — I110 Hypertensive heart disease with heart failure: Secondary | ICD-10-CM | POA: Diagnosis present

## 2018-08-24 DIAGNOSIS — N400 Enlarged prostate without lower urinary tract symptoms: Secondary | ICD-10-CM | POA: Diagnosis present

## 2018-08-24 DIAGNOSIS — I255 Ischemic cardiomyopathy: Secondary | ICD-10-CM | POA: Diagnosis present

## 2018-08-24 DIAGNOSIS — I440 Atrioventricular block, first degree: Secondary | ICD-10-CM | POA: Diagnosis present

## 2018-08-24 DIAGNOSIS — N179 Acute kidney failure, unspecified: Secondary | ICD-10-CM | POA: Diagnosis present

## 2018-08-24 DIAGNOSIS — I447 Left bundle-branch block, unspecified: Secondary | ICD-10-CM | POA: Diagnosis present

## 2018-08-24 DIAGNOSIS — I214 Non-ST elevation (NSTEMI) myocardial infarction: Principal | ICD-10-CM | POA: Diagnosis present

## 2018-08-24 DIAGNOSIS — I25810 Atherosclerosis of coronary artery bypass graft(s) without angina pectoris: Secondary | ICD-10-CM

## 2018-08-24 DIAGNOSIS — E785 Hyperlipidemia, unspecified: Secondary | ICD-10-CM | POA: Diagnosis present

## 2018-08-24 DIAGNOSIS — I5023 Acute on chronic systolic (congestive) heart failure: Secondary | ICD-10-CM | POA: Diagnosis present

## 2018-08-24 DIAGNOSIS — Z7902 Long term (current) use of antithrombotics/antiplatelets: Secondary | ICD-10-CM

## 2018-08-24 DIAGNOSIS — I455 Other specified heart block: Secondary | ICD-10-CM | POA: Diagnosis present

## 2018-08-24 DIAGNOSIS — Z885 Allergy status to narcotic agent status: Secondary | ICD-10-CM | POA: Diagnosis not present

## 2018-08-24 DIAGNOSIS — Z7982 Long term (current) use of aspirin: Secondary | ICD-10-CM

## 2018-08-24 DIAGNOSIS — Z951 Presence of aortocoronary bypass graft: Secondary | ICD-10-CM

## 2018-08-24 DIAGNOSIS — I5021 Acute systolic (congestive) heart failure: Secondary | ICD-10-CM | POA: Diagnosis present

## 2018-08-24 DIAGNOSIS — J9621 Acute and chronic respiratory failure with hypoxia: Secondary | ICD-10-CM | POA: Diagnosis not present

## 2018-08-24 DIAGNOSIS — E876 Hypokalemia: Secondary | ICD-10-CM | POA: Diagnosis present

## 2018-08-24 DIAGNOSIS — Z8673 Personal history of transient ischemic attack (TIA), and cerebral infarction without residual deficits: Secondary | ICD-10-CM

## 2018-08-24 HISTORY — PX: LEFT HEART CATH AND CORS/GRAFTS ANGIOGRAPHY: CATH118250

## 2018-08-24 HISTORY — PX: CORONARY STENT INTERVENTION: CATH118234

## 2018-08-24 LAB — BASIC METABOLIC PANEL
ANION GAP: 15 (ref 5–15)
BUN: 28 mg/dL — ABNORMAL HIGH (ref 8–23)
CO2: 22 mmol/L (ref 22–32)
Calcium: 9.5 mg/dL (ref 8.9–10.3)
Chloride: 102 mmol/L (ref 98–111)
Creatinine, Ser: 1.36 mg/dL — ABNORMAL HIGH (ref 0.61–1.24)
GFR calc Af Amer: 58 mL/min — ABNORMAL LOW (ref >60)
GFR calc non Af Amer: 50 mL/min — ABNORMAL LOW (ref >60)
Glucose, Bld: 217 mg/dL — ABNORMAL HIGH (ref 70–99)
Potassium: 4.2 mmol/L (ref 3.5–5.1)
Sodium: 139 mmol/L (ref 135–145)

## 2018-08-24 LAB — PROTIME-INR
INR: 1.16
Prothrombin Time: 14.7 seconds (ref 11.4–15.2)

## 2018-08-24 LAB — CBC
HCT: 43.2 % (ref 39.0–52.0)
HCT: 40.4 % (ref 39.0–52.0)
Hemoglobin: 13.8 g/dL (ref 13.0–17.0)
Hemoglobin: 13 g/dL (ref 13.0–17.0)
MCH: 29.5 pg (ref 26.0–34.0)
MCH: 29.9 pg (ref 26.0–34.0)
MCHC: 31.9 g/dL (ref 30.0–36.0)
MCHC: 32.2 g/dL (ref 30.0–36.0)
MCV: 92.3 fL (ref 80.0–100.0)
MCV: 92.9 fL (ref 80.0–100.0)
NRBC: 0 % (ref 0.0–0.2)
NRBC: 0 % (ref 0.0–0.2)
Platelets: 279 10*3/uL (ref 150–400)
Platelets: 252 10*3/uL (ref 150–400)
RBC: 4.68 MIL/uL (ref 4.22–5.81)
RBC: 4.35 MIL/uL (ref 4.22–5.81)
RDW: 16.9 % — ABNORMAL HIGH (ref 11.5–15.5)
RDW: 16.9 % — ABNORMAL HIGH (ref 11.5–15.5)
WBC: 14.1 10*3/uL — ABNORMAL HIGH (ref 4.0–10.5)
WBC: 11.6 10*3/uL — AB (ref 4.0–10.5)

## 2018-08-24 LAB — GLUCOSE, CAPILLARY
Glucose-Capillary: 126 mg/dL — ABNORMAL HIGH (ref 70–99)
Glucose-Capillary: 150 mg/dL — ABNORMAL HIGH (ref 70–99)
Glucose-Capillary: 131 mg/dL — ABNORMAL HIGH (ref 70–99)
Glucose-Capillary: 115 mg/dL — ABNORMAL HIGH (ref 70–99)
Glucose-Capillary: 175 mg/dL — ABNORMAL HIGH (ref 70–99)

## 2018-08-24 LAB — TSH: TSH: 3.247 u[IU]/mL (ref 0.350–4.500)

## 2018-08-24 LAB — POCT ACTIVATED CLOTTING TIME: Activated Clotting Time: 390 seconds

## 2018-08-24 LAB — TROPONIN I
Troponin I: 0.2 ng/mL (ref <0.03)
Troponin I: 3.26 ng/mL (ref <0.03)
Troponin I: 13.5 ng/mL (ref <0.03)
Troponin I: 22.55 ng/mL (ref <0.03)

## 2018-08-24 LAB — APTT: aPTT: 112 seconds — ABNORMAL HIGH (ref 24–36)

## 2018-08-24 LAB — BRAIN NATRIURETIC PEPTIDE: B Natriuretic Peptide: 490 pg/mL — ABNORMAL HIGH (ref 0.0–100.0)

## 2018-08-24 LAB — MRSA PCR SCREENING: MRSA by PCR: NEGATIVE

## 2018-08-24 LAB — HEPARIN LEVEL (UNFRACTIONATED): Heparin Unfractionated: 0.4 IU/mL (ref 0.30–0.70)

## 2018-08-24 SURGERY — LEFT HEART CATH AND CORS/GRAFTS ANGIOGRAPHY
Anesthesia: Moderate Sedation

## 2018-08-24 MED ORDER — INSULIN ASPART 100 UNIT/ML ~~LOC~~ SOLN: 0.0000 [IU] | Freq: Every day | SUBCUTANEOUS | Status: DC

## 2018-08-24 MED ORDER — CLOPIDOGREL BISULFATE 300 MG PO TABS
ORAL_TABLET | ORAL | Status: AC
  Filled 2018-08-24: qty 1

## 2018-08-24 MED ORDER — FENTANYL CITRATE (PF) 100 MCG/2ML IJ SOLN
INTRAMUSCULAR | Status: DC | PRN
  Administered 2018-08-24: 25 ug via INTRAVENOUS

## 2018-08-24 MED ORDER — IPRATROPIUM-ALBUTEROL 0.5-2.5 (3) MG/3ML IN SOLN: 3.0000 mL | Freq: Four times a day (QID) | RESPIRATORY_TRACT | Status: DC | PRN

## 2018-08-24 MED ORDER — ONDANSETRON HCL 4 MG PO TABS: 4.0000 mg | ORAL_TABLET | Freq: Four times a day (QID) | ORAL | Status: DC | PRN

## 2018-08-24 MED ORDER — AMLODIPINE BESYLATE 5 MG PO TABS
5.0000 mg | ORAL_TABLET | Freq: Every day | ORAL | Status: DC
  Administered 2018-08-24 – 2018-08-26 (×3): 5 mg via ORAL
  Filled 2018-08-24 (×3): qty 1

## 2018-08-24 MED ORDER — BIVALIRUDIN TRIFLUOROACETATE 250 MG IV SOLR
INTRAVENOUS | Status: AC
  Filled 2018-08-24: qty 250

## 2018-08-24 MED ORDER — FUROSEMIDE 20 MG PO TABS: 40.0000 mg | ORAL_TABLET | Freq: Every day | ORAL | Status: DC | PRN

## 2018-08-24 MED ORDER — VITAMIN D3 25 MCG (1000 UNIT) PO TABS
1000.0000 [IU] | ORAL_TABLET | Freq: Every day | ORAL | Status: DC
  Administered 2018-08-24 – 2018-08-26 (×3): 1000 [IU] via ORAL
  Filled 2018-08-24 (×4): qty 1

## 2018-08-24 MED ORDER — ASPIRIN EC 325 MG PO TBEC
325.0000 mg | DELAYED_RELEASE_TABLET | Freq: Every day | ORAL | Status: DC
  Administered 2018-08-24: 325 mg via ORAL
  Filled 2018-08-24: qty 1

## 2018-08-24 MED ORDER — SODIUM CHLORIDE 0.9 % IV SOLN
INTRAVENOUS | Status: AC
  Administered 2018-08-24: 20:00:00 via INTRAVENOUS

## 2018-08-24 MED ORDER — ADULT MULTIVITAMIN W/MINERALS CH
1.0000 | ORAL_TABLET | Freq: Every day | ORAL | Status: DC
  Administered 2018-08-24 – 2018-08-26 (×3): 1 via ORAL
  Filled 2018-08-24 (×3): qty 1

## 2018-08-24 MED ORDER — FUROSEMIDE 10 MG/ML IJ SOLN
INTRAMUSCULAR | Status: AC
  Administered 2018-08-24: 40 mg via INTRAVENOUS
  Filled 2018-08-24: qty 4

## 2018-08-24 MED ORDER — ONDANSETRON HCL 4 MG/2ML IJ SOLN: 4.0000 mg | Freq: Four times a day (QID) | INTRAMUSCULAR | Status: DC | PRN

## 2018-08-24 MED ORDER — LABETALOL HCL 5 MG/ML IV SOLN: 10.0000 mg | INTRAVENOUS | Status: DC | PRN

## 2018-08-24 MED ORDER — SODIUM CHLORIDE 0.9% FLUSH
3.0000 mL | Freq: Two times a day (BID) | INTRAVENOUS | Status: DC
  Administered 2018-08-25 – 2018-08-26 (×3): 3 mL via INTRAVENOUS

## 2018-08-24 MED ORDER — NITROGLYCERIN 1 MG/10 ML FOR IR/CATH LAB
INTRA_ARTERIAL | Status: DC | PRN
  Administered 2018-08-24: 200 ug via INTRACORONARY

## 2018-08-24 MED ORDER — SODIUM CHLORIDE 0.9% FLUSH: 3.0000 mL | INTRAVENOUS | Status: DC | PRN

## 2018-08-24 MED ORDER — IOPAMIDOL (ISOVUE-300) INJECTION 61%
INTRAVENOUS | Status: DC | PRN
  Administered 2018-08-24: 100 mL via INTRA_ARTERIAL

## 2018-08-24 MED ORDER — INSULIN ASPART 100 UNIT/ML ~~LOC~~ SOLN
0.0000 [IU] | Freq: Three times a day (TID) | SUBCUTANEOUS | Status: DC
  Administered 2018-08-24: 1 [IU] via SUBCUTANEOUS
  Administered 2018-08-25: 2 [IU] via SUBCUTANEOUS
  Administered 2018-08-25 – 2018-08-26 (×2): 1 [IU] via SUBCUTANEOUS
  Filled 2018-08-24 (×4): qty 1

## 2018-08-24 MED ORDER — HEPARIN (PORCINE) 25000 UT/250ML-% IV SOLN
1200.0000 [IU]/h | INTRAVENOUS | Status: DC
  Administered 2018-08-24: 1200 [IU]/h via INTRAVENOUS
  Filled 2018-08-24: qty 250

## 2018-08-24 MED ORDER — HEPARIN SODIUM (PORCINE) 5000 UNIT/ML IJ SOLN
5000.0000 [IU] | Freq: Three times a day (TID) | INTRAMUSCULAR | Status: DC
  Administered 2018-08-25 – 2018-08-26 (×4): 5000 [IU] via SUBCUTANEOUS
  Filled 2018-08-24 (×4): qty 1

## 2018-08-24 MED ORDER — HEPARIN BOLUS VIA INFUSION
4000.0000 [IU] | Freq: Once | INTRAVENOUS | Status: AC
  Administered 2018-08-24: 4000 [IU] via INTRAVENOUS
  Filled 2018-08-24: qty 4000

## 2018-08-24 MED ORDER — CLOPIDOGREL BISULFATE 75 MG PO TABS
75.0000 mg | ORAL_TABLET | Freq: Every day | ORAL | Status: DC
  Administered 2018-08-24 – 2018-08-26 (×3): 75 mg via ORAL
  Filled 2018-08-24 (×3): qty 1

## 2018-08-24 MED ORDER — SODIUM CHLORIDE 0.9% FLUSH
3.0000 mL | Freq: Two times a day (BID) | INTRAVENOUS | Status: DC
  Administered 2018-08-24: 3 mL via INTRAVENOUS

## 2018-08-24 MED ORDER — IPRATROPIUM-ALBUTEROL 0.5-2.5 (3) MG/3ML IN SOLN
RESPIRATORY_TRACT | Status: AC
  Administered 2018-08-24: 3 mL
  Filled 2018-08-24: qty 6

## 2018-08-24 MED ORDER — FUROSEMIDE 10 MG/ML IJ SOLN
40.0000 mg | Freq: Once | INTRAMUSCULAR | Status: AC
  Administered 2018-08-24 (×2): 40 mg via INTRAVENOUS

## 2018-08-24 MED ORDER — FLUTICASONE PROPIONATE 50 MCG/ACT NA SUSP
2.0000 | Freq: Every day | NASAL | Status: DC | PRN
  Filled 2018-08-24: qty 16

## 2018-08-24 MED ORDER — SODIUM CHLORIDE 0.9 % IV SOLN
INTRAVENOUS | Status: DC | PRN
  Administered 2018-08-24: 1.75 mg/kg/h via INTRAVENOUS

## 2018-08-24 MED ORDER — MIDAZOLAM HCL 2 MG/2ML IJ SOLN
INTRAMUSCULAR | Status: DC | PRN
  Administered 2018-08-24: 1 mg via INTRAVENOUS

## 2018-08-24 MED ORDER — MIDAZOLAM HCL 2 MG/2ML IJ SOLN
INTRAMUSCULAR | Status: AC
  Filled 2018-08-24: qty 2

## 2018-08-24 MED ORDER — CLOPIDOGREL BISULFATE 75 MG PO TABS
ORAL_TABLET | ORAL | Status: DC | PRN
  Administered 2018-08-24: 600 mg via ORAL

## 2018-08-24 MED ORDER — DOCUSATE SODIUM 100 MG PO CAPS
100.0000 mg | ORAL_CAPSULE | Freq: Two times a day (BID) | ORAL | Status: DC
  Administered 2018-08-24 – 2018-08-26 (×5): 100 mg via ORAL
  Filled 2018-08-24 (×5): qty 1

## 2018-08-24 MED ORDER — HYDRALAZINE HCL 20 MG/ML IJ SOLN: 5.0000 mg | INTRAMUSCULAR | Status: DC | PRN

## 2018-08-24 MED ORDER — ACETAMINOPHEN 650 MG RE SUPP: 650.0000 mg | Freq: Four times a day (QID) | RECTAL | Status: DC | PRN

## 2018-08-24 MED ORDER — IOPAMIDOL (ISOVUE-300) INJECTION 61%
INTRAVENOUS | Status: DC | PRN
  Administered 2018-08-24: 70 mL via INTRA_ARTERIAL

## 2018-08-24 MED ORDER — FENTANYL CITRATE (PF) 100 MCG/2ML IJ SOLN
INTRAMUSCULAR | Status: AC
  Filled 2018-08-24: qty 2

## 2018-08-24 MED ORDER — CARVEDILOL 6.25 MG PO TABS
6.2500 mg | ORAL_TABLET | Freq: Two times a day (BID) | ORAL | Status: DC
  Administered 2018-08-24 – 2018-08-26 (×3): 6.25 mg via ORAL
  Filled 2018-08-24 (×3): qty 1

## 2018-08-24 MED ORDER — SODIUM CHLORIDE 0.9 % WEIGHT BASED INFUSION: 3.0000 mL/kg/h | INTRAVENOUS | Status: DC

## 2018-08-24 MED ORDER — SODIUM CHLORIDE 0.9 % IV SOLN: 250.0000 mL | INTRAVENOUS | Status: DC | PRN

## 2018-08-24 MED ORDER — ISOSORBIDE MONONITRATE ER 30 MG PO TB24
30.0000 mg | ORAL_TABLET | Freq: Every day | ORAL | Status: DC
  Administered 2018-08-24 – 2018-08-26 (×3): 30 mg via ORAL
  Filled 2018-08-24 (×3): qty 1

## 2018-08-24 MED ORDER — BIVALIRUDIN BOLUS VIA INFUSION - CUPID
INTRAVENOUS | Status: DC | PRN
  Administered 2018-08-24: 60.225 mg via INTRAVENOUS

## 2018-08-24 MED ORDER — ASPIRIN 81 MG PO CHEW: 81.0000 mg | CHEWABLE_TABLET | ORAL | Status: DC

## 2018-08-24 MED ORDER — ASPIRIN 81 MG PO CHEW
81.0000 mg | CHEWABLE_TABLET | Freq: Every day | ORAL | Status: DC
  Administered 2018-08-25 – 2018-08-26 (×2): 81 mg via ORAL
  Filled 2018-08-24 (×2): qty 1

## 2018-08-24 MED ORDER — ATORVASTATIN CALCIUM 20 MG PO TABS
20.0000 mg | ORAL_TABLET | Freq: Every day | ORAL | Status: DC
  Administered 2018-08-25: 20 mg via ORAL
  Filled 2018-08-24 (×2): qty 1

## 2018-08-24 MED ORDER — NITROGLYCERIN 5 MG/ML IV SOLN
INTRAVENOUS | Status: AC
  Filled 2018-08-24: qty 10

## 2018-08-24 MED ORDER — SODIUM CHLORIDE 0.9 % WEIGHT BASED INFUSION
1.0000 mL/kg/h | INTRAVENOUS | Status: DC
  Administered 2018-08-24: 1 mL/kg/h via INTRAVENOUS

## 2018-08-24 MED ORDER — OCUVITE-LUTEIN PO CAPS
1.0000 | ORAL_CAPSULE | Freq: Every day | ORAL | Status: DC
  Administered 2018-08-24 – 2018-08-26 (×3): 1 via ORAL
  Filled 2018-08-24 (×4): qty 1

## 2018-08-24 MED ORDER — LORATADINE 10 MG PO TABS
10.0000 mg | ORAL_TABLET | Freq: Every day | ORAL | Status: DC
  Administered 2018-08-24 – 2018-08-26 (×3): 10 mg via ORAL
  Filled 2018-08-24 (×3): qty 1

## 2018-08-24 MED ORDER — HEPARIN (PORCINE) IN NACL 1000-0.9 UT/500ML-% IV SOLN
INTRAVENOUS | Status: AC
  Filled 2018-08-24: qty 1000

## 2018-08-24 MED ORDER — EZETIMIBE 10 MG PO TABS
10.0000 mg | ORAL_TABLET | Freq: Every day | ORAL | Status: DC
  Administered 2018-08-24 – 2018-08-25 (×2): 10 mg via ORAL
  Filled 2018-08-24 (×4): qty 1

## 2018-08-24 MED ORDER — FUROSEMIDE 10 MG/ML IJ SOLN
40.0000 mg | Freq: Once | INTRAMUSCULAR | Status: AC
  Administered 2018-08-24: 40 mg via INTRAVENOUS
  Filled 2018-08-24: qty 4

## 2018-08-24 MED ORDER — ACETAMINOPHEN 325 MG PO TABS: 650.0000 mg | ORAL_TABLET | Freq: Four times a day (QID) | ORAL | Status: DC | PRN

## 2018-08-24 SURGICAL SUPPLY — 21 items
BALLN MINITREK RX 2.0X8 (BALLOONS) ×3
BALLN ~~LOC~~ TREK RX 2.5X8 (BALLOONS) ×3
BALLN ~~LOC~~ TREK RX 3.25X12 (BALLOONS) ×3
BALLOON MINITREK RX 2.0X8 (BALLOONS) IMPLANT
BALLOON ~~LOC~~ TREK RX 2.5X8 (BALLOONS) IMPLANT
BALLOON ~~LOC~~ TREK RX 3.25X12 (BALLOONS) IMPLANT
CATH INFINITI 5FR ANG PIGTAIL (CATHETERS) ×2 IMPLANT
CATH INFINITI 5FR JL4 (CATHETERS) ×2 IMPLANT
CATH INFINITI JR4 5F (CATHETERS) ×2 IMPLANT
CATH VISTA GUIDE 6FR LCB (CATHETERS) ×2 IMPLANT
DEVICE CLOSURE MYNXGRIP 6/7F (Vascular Products) ×2 IMPLANT
DEVICE INFLAT 30 PLUS (MISCELLANEOUS) ×2 IMPLANT
KIT MANI 3VAL PERCEP (MISCELLANEOUS) ×3 IMPLANT
NDL PERC 18GX7CM (NEEDLE) IMPLANT
NEEDLE PERC 18GX7CM (NEEDLE) ×3 IMPLANT
PACK CARDIAC CATH (CUSTOM PROCEDURE TRAY) ×3 IMPLANT
SHEATH AVANTI 5FR X 11CM (SHEATH) ×2 IMPLANT
SHEATH AVANTI 6FR X 11CM (SHEATH) ×2 IMPLANT
STENT RESOLUTE ONYX 2.0X15 (Permanent Stent) ×2 IMPLANT
WIRE GUIDERIGHT .035X150 (WIRE) ×2 IMPLANT
WIRE RUNTHROUGH .014X180CM (WIRE) ×2 IMPLANT

## 2018-08-24 NOTE — Progress Notes (Signed)
[employer] [facility]   PATIENT NAME: Reginald Franklin    MR#:  [id number]  DATE OF BIRTH:  July 04, 1941  SUBJECTIVE:  CHIEF COMPLAINT:   Chief Complaint  Patient presents with  . Shortness of Breath   -Denies any chest pain, shortness of breath. -Troponin elevated significantly.  For cardiac cath today  REVIEW OF SYSTEMS:  Review of Systems  Constitutional: Positive for malaise/fatigue. Negative for chills and fever.  HENT: Negative for congestion, ear discharge, hearing loss and nosebleeds.   Eyes: Negative for blurred vision and double vision.  Respiratory: Positive for shortness of breath. Negative for cough and wheezing.   Cardiovascular: Positive for leg swelling. Negative for chest pain and palpitations.  Gastrointestinal: Negative for abdominal pain, constipation, diarrhea, nausea and vomiting.  Genitourinary: Negative for dysuria.  Musculoskeletal: Negative for myalgias.  Neurological: Negative for dizziness, focal weakness, seizures, weakness and headaches.  Psychiatric/Behavioral: Negative for depression.    DRUG ALLERGIES:   Allergies  Allergen Reactions  . Codeine Nausea And Vomiting    VITALS:  Blood pressure 136/76, pulse 85, temperature 98.7 F (37.1 C), temperature source Oral, resp. rate 19, height 6' (1.829 m), weight 80.3 kg, SpO2 98 %.  PHYSICAL EXAMINATION:  Physical Exam   GENERAL:  77 y.o.-year-old patient lying in the bed with no acute distress.  EYES: Pupils equal, round, reactive to light and accommodation. No scleral icterus. Extraocular muscles intact.  HEENT: Head atraumatic, normocephalic. Oropharynx and nasopharynx clear.  NECK:  Supple, no jugular venous distention. No thyroid enlargement, no tenderness.  LUNGS: Normal breath sounds bilaterally, no wheezing, rales,rhonchi or crepitation. No use of accessory muscles of respiration.  Decreased bibasilar breath sounds. CARDIOVASCULAR: S1, S2 normal. No  rubs,  or gallops.  2/6 systolic murmur is present ABDOMEN: Soft, nontender, nondistended. Bowel sounds present. No organomegaly or mass.  EXTREMITIES: No pedal edema, cyanosis, or clubbing.  NEUROLOGIC: Cranial nerves II through XII are intact. Muscle strength 5/5 in all extremities. Sensation intact. Gait not checked.  PSYCHIATRIC: The patient is alert and oriented x 3.  SKIN: No obvious rash, lesion, or ulcer.    LABORATORY PANEL:   CBC Recent Labs  Lab 08/24/18 0642  WBC 11.6*  HGB 13.0  HCT 40.4  PLT 252   ------------------------------------------------------------------------------------------------------------------  Chemistries  Recent Labs  Lab 08/24/18 0220  NA 139  K 4.2  CL 102  CO2 22  GLUCOSE 217*  BUN 28*  CREATININE 1.36*  CALCIUM 9.5   ------------------------------------------------------------------------------------------------------------------  Cardiac Enzymes Recent Labs  Lab 08/24/18 1152  TROPONINI 13.50*   ------------------------------------------------------------------------------------------------------------------  RADIOLOGY:  Dg Chest Port 1 View  Result Date: 08/24/2018 CLINICAL DATA:  77 year old male with dyspnea. EXAM: PORTABLE CHEST 1 VIEW COMPARISON:  Chest CT dated 05/19/2016 FINDINGS: There is mild cardiomegaly with vascular congestion and interstitial edema. Superimposed pneumonia is not excluded. Clinical correlation is recommended. No focal consolidation, pleural effusion, or pneumothorax. Median sternotomy wires and CABG vascular clips noted. Atherosclerotic calcification of the aortic arch. No acute osseous pathology. IMPRESSION: Cardiomegaly with vascular congestion and interstitial edema. Superimposed pneumonia is not excluded. Electronically Signed   By: Elgie Collard M.D.   On: 08/24/2018 02:45    EKG:   Orders placed or performed during the hospital encounter of 08/24/18  . ED EKG within 10 minutes  . ED EKG within  10 minutes  . EKG 12-Lead  . EKG 12-Lead  . ED EKG  . ED EKG    ASSESSMENT AND  PLAN:   77 year old male with past medical history significant for CAD status post CABG, hypertension, diabetes and hyperlipidemia presents to hospital secondary to worsening shortness of breath  1.  NSTEMI-ST depressions noted on EKG and also significantly elevated troponin -Appreciate cardiology consult.  Patient on heparin drip -For cardiac catheterization today -Echocardiogram is pending -On aspirin, Plavix, statin, Coreg and Imdur  2.  Acute on chronic systolic heart failure-last known EF from September 2019 as outpatient of 35%, has ischemic cardiomyopathy. -Continue IV Lasix.  3.  Hypertension-on Coreg, Imdur and Norvasc  4.  Diabetes-on sliding scale insulin  5.  DVT prophylaxis-currently on heparin drip  Ambulatory at baseline Updated family at bedside   All the records are reviewed and case discussed with Care Management/Social Workerr. Management plans discussed with the patient, family and they are in agreement.  CODE STATUS: Full code  TOTAL TIME TAKING CARE OF THIS PATIENT: 38 minutes.   POSSIBLE D/C IN 1-2 DAYS, DEPENDING ON CLINICAL CONDITION.   Enid BaasKALISETTI,Dareen Gutzwiller M.D on 08/24/2018 at 2:55 PM  Between 7am to 6pm - Pager - [phone]  After 6pm go to [email or web address] - Social research officer, governmentpassword EPAS [facility]  [facility]  Office  [phone]  CC: Primary care physician; Marguarite ArbourSparks, Jeffrey D, MD

## 2018-08-24 NOTE — ED Triage Notes (Signed)
Pt brought in via ems from home. Pt has sob and is on cpap on arrival by ems.  Pt alert.  md at bedside.

## 2018-08-24 NOTE — ED Notes (Signed)
Pt denies chest pain or chest pressure at this time  RT at bedside.

## 2018-08-24 NOTE — ED Notes (Signed)
Portable xray done.  Pt on bipap.

## 2018-08-24 NOTE — Progress Notes (Addendum)
CRITICAL VALUE ALERT  Critical Value:  13.5 troponin (up from 3.2)  Date & Time Notied:  1228  Provider Notified: Gwen PoundsKowalski  Orders Received/Actions taken: awaiting new orders

## 2018-08-24 NOTE — Progress Notes (Signed)
   08/24/18 0225  Clinical Encounter Type  Visited With Patient and family together  Visit Type Code  Spiritual Encounters  Spiritual Needs Emotional   Chaplain responded to code stemi-spouse at patient's bedside.  Patient wearing bipap and did not engage in conversation.  Patient's spouse expressed appreciation for chaplain checking in but indicated that they would page for chaplain if needed.  Code stemi then cancelled.

## 2018-08-24 NOTE — Progress Notes (Signed)
Transported pt to ICU on Bipap without incident. Pt remains on Bipap and is tol well at this time. Report given to ICU RT. 

## 2018-08-24 NOTE — ED Notes (Signed)
ED Provider at bedside. 

## 2018-08-24 NOTE — Progress Notes (Signed)
ANTICOAGULATION CONSULT NOTE - Initial Consult  Pharmacy Consult for heparin drip Indication: chest pain/ACS  Allergies  Allergen Reactions  . Codeine Nausea And Vomiting    Patient Measurements: Height: 6' (182.9 cm) Weight: 177 lb (80.3 kg) IBW/kg (Calculated) : 77.6 Heparin Dosing Weight: 80 kg  Vital Signs: Temp: 98.4 F (36.9 C) (12/03 1500) Temp Source: Oral (12/03 1500) BP: 103/85 (12/03 1500) Pulse Rate: 70 (12/03 1500)  Labs: Recent Labs    08/24/18 0220 08/24/18 0642 08/24/18 1152  HGB 13.8 13.0  --   HCT 43.2 40.4  --   PLT 279 252  --   APTT  --  112*  --   LABPROT  --  14.7  --   INR  --  1.16  --   HEPARINUNFRC  --   --  0.40  CREATININE 1.36*  --   --   TROPONINI 0.20* 3.26* 13.50*    Estimated Creatinine Clearance: 49.9 mL/min (A) (by C-G formula based on SCr of 1.36 mg/dL (H)).   Medical History: Past Medical History:  Diagnosis Date  . Anginal pain (HCC)   . BPH (benign prostatic hyperplasia)   . Coronary artery disease   . Diabetes mellitus without complication [facility](HCC)    Patient takes Metformin.  . Erosive gastropathy   . Gastritis   . Hemorrhoids   . Hiatal hernia   . Hyperlipidemia   . Hypertension   . Nasal polyp   . Peripheral vascular disease (HCC)   . S/P CABG x 3   . Seasonal allergies   . Stroke [facility](HCC)     Medications:  Scheduled:  . [MAR Hold] amLODipine  5 mg Oral Daily  . [START ON 08/25/2018] aspirin  81 mg Oral Pre-Cath  . [MAR Hold] aspirin  325 mg Oral Daily  . [MAR Hold] atorvastatin  20 mg Oral Daily  . [MAR Hold] carvedilol  6.25 mg Oral BID WC  . [MAR Hold] cholecalciferol  1,000 Units Oral Daily  . [MAR Hold] clopidogrel  75 mg Oral Q breakfast  . [MAR Hold] docusate sodium  100 mg Oral BID  . [MAR Hold] ezetimibe  10 mg Oral QHS  . [MAR Hold] insulin aspart  0-5 Units Subcutaneous QHS  . [MAR Hold] insulin aspart  0-9 Units Subcutaneous TID WC  . [MAR Hold] isosorbide mononitrate  30 mg Oral Daily  .  [MAR Hold] loratadine  10 mg Oral Daily  . [MAR Hold] multivitamin with minerals  1 tablet Oral Daily  . [MAR Hold] multivitamin-lutein  1 capsule Oral Daily  . sodium chloride flush  3 mL Intravenous Q12H    Assessment: Patient admitted for SOB arrives on CPAP from home. Patient has h/o CAD s/p stent placement 2017. Initial trop of 0.20, EKG showing ST elevation in V1 and ST depression in V5 w/ LBBB.  Patient is not on anticoagulation PTA aside from ASA/plavix Patient is being started on heparin drip for ACS  Goal of Therapy:  Heparin level 0.3-0.7 units/ml Monitor platelets by anticoagulation protocol: Yes   Plan:  Continue heparin 1200 units/hr. Patient currently in Cath Lab. Will need to follow up plans for Heparin and schedule next anti-Xa level accordingly.   Pharmacy will continue to monitor and adjust per consult.   MLS 08/24/2018

## 2018-08-24 NOTE — ED Notes (Signed)
Report off to NetherlandsJenn rn and AutoNationHenry rn

## 2018-08-24 NOTE — ED Notes (Signed)
ED TO INPATIENT HANDOFF REPORT  Name/Age/Gender Reginald Franklin 77 y.o. male  Code Status Code Status History    Date Active Date Inactive Code Status Order ID Comments User Context   06/10/2016 1003 06/11/2016 1301 Full Code [id number]  Marcina MillardParaschos, Alexander, MD Inpatient   06/10/2016 1003 06/10/2016 1003 Full Code [id number]  Dalia HeadingFath, Kenneth A, MD Inpatient   06/10/2016 1002 06/10/2016 1003 Full Code [id number]  Marcina MillardParaschos, Alexander, MD Inpatient      Home/SNF/Other Home   Chief Complaint Breathing Difficulty  Level of Care/Admitting Diagnosis ED Disposition    ED Disposition Condition Comment   Admit  Hospital Area: [facility] [[id number]]  Level of Care: Stepdown [14]  Diagnosis: Acute systolic CHF (congestive heart failure) [facility](HCC) [[id number]]  Admitting Physician: Arnaldo NatalIAMOND, MICHAEL S [[id number]]  Attending Physician: Arnaldo NatalDIAMOND, MICHAEL S [[id number]]  Estimated length of stay: past midnight tomorrow  Certification:: I certify this patient will need inpatient services for at least 2 midnights  PT Class (Do Not Modify): Inpatient [101]  PT Acc Code (Do Not Modify): Private [1]       Medical History Past Medical History:  Diagnosis Date  . Anginal pain (HCC)   . BPH (benign prostatic hyperplasia)   . Coronary artery disease   . Diabetes mellitus without complication [facility](HCC)    Patient takes Metformin.  . Erosive gastropathy   . Gastritis   . Hemorrhoids   . Hiatal hernia   . Hyperlipidemia   . Hypertension   . Nasal polyp   . Peripheral vascular disease (HCC)   . S/P CABG x 3   . Seasonal allergies   . Stroke [facility](HCC)     Allergies Allergies  Allergen Reactions  . Codeine Nausea And Vomiting    IV Location/Drains/Wounds Patient Lines/Drains/Airways Status   Active Line/Drains/Airways    Name:   Placement date:   Placement time:   Site:   Days:   Peripheral IV 08/24/18 Left Wrist   08/24/18    0221    Wrist   less than 1   Peripheral IV 08/24/18 Left  Forearm   08/24/18    0223    Forearm   less than 1          Labs/Imaging Results for orders placed or performed during the hospital encounter of 08/24/18 (from the past 48 hour(s))  Basic metabolic panel     Status: Abnormal   Collection Time: 08/24/18  2:20 AM  Result Value Ref Range   Sodium 139 135 - 145 mmol/L   Potassium 4.2 3.5 - 5.1 mmol/L   Chloride 102 98 - 111 mmol/L   CO2 22 22 - 32 mmol/L   Glucose, Bld 217 (H) 70 - 99 mg/dL   BUN 28 (H) 8 - 23 mg/dL   Creatinine, Ser 3.471.36 (H) 0.61 - 1.24 mg/dL   Calcium 9.5 8.9 - 42.510.3 mg/dL   GFR calc non Af Amer 50 (L) >60 mL/min   GFR calc Af Amer 58 (L) >60 mL/min   Anion gap 15 5 - 15    Comment: Performed at [facility], [address] [place], [place] [address]  CBC     Status: Abnormal   Collection Time: 08/24/18  2:20 AM  Result Value Ref Range   WBC 14.1 (H) 4.0 - 10.5 K/uL   RBC 4.68 4.22 - 5.81 MIL/uL   Hemoglobin 13.8 13.0 - 17.0 g/dL   HCT 75.643.2 43.339.0 - 29.552.0 %   MCV 92.3 80.0 -  100.0 fL   MCH 29.5 26.0 - 34.0 pg   MCHC 31.9 30.0 - 36.0 g/dL   RDW 16.1 (H) 09.6 - 04.5 %   Platelets 279 150 - 400 K/uL   nRBC 0.0 0.0 - 0.2 %    Comment: Performed at [facility], [address] [place], [place] [address]  Troponin I - ONCE - STAT     Status: Abnormal   Collection Time: 08/24/18  2:20 AM  Result Value Ref Range   Troponin I 0.20 (HH) <0.03 ng/mL    Comment: CRITICAL RESULT CALLED TO, READ BACK BY AND VERIFIED WITH @0304  08/24/18 AKT Performed at [facility], [address] [place], [place] [address]   Brain natriuretic peptide     Status: Abnormal   Collection Time: 08/24/18  2:20 AM  Result Value Ref Range   B Natriuretic Peptide 490.0 (H) 0.0 - 100.0 pg/mL    Comment: Performed at [facility], [address], [place], [place] [address]   Dg Chest Port 1 View  Result Date: 08/24/2018 CLINICAL DATA:  77 year old male with dyspnea. EXAM: PORTABLE CHEST 1 VIEW  COMPARISON:  Chest CT dated 05/19/2016 FINDINGS: There is mild cardiomegaly with vascular congestion and interstitial edema. Superimposed pneumonia is not excluded. Clinical correlation is recommended. No focal consolidation, pleural effusion, or pneumothorax. Median sternotomy wires and CABG vascular clips noted. Atherosclerotic calcification of the aortic arch. No acute osseous pathology. IMPRESSION: Cardiomegaly with vascular congestion and interstitial edema. Superimposed pneumonia is not excluded. Electronically Signed   By: Elgie Collard M.D.   On: 08/24/2018 02:45    Pending Labs Unresulted Labs (From admission, onward)    Start     Ordered   08/24/18 1100  Heparin level (unfractionated)  Once-Timed,   STAT     08/24/18 0306   08/24/18 0500  CBC  Daily,   STAT     08/24/18 0306   08/24/18 0324  Protime-INR  Add-on,   AD     08/24/18 0323   08/24/18 0324  APTT  Add-on,   AD     08/24/18 0323   Signed and Held  Creatinine, serum  (enoxaparin (LOVENOX)    CrCl >/= 30 ml/min)  Weekly,   R    Comments:  while on enoxaparin therapy    Signed and Held   Signed and Held  TSH  Add-on,   R     Signed and Held   Signed and Held  Troponin I - Now Then Q6H  Now then every 6 hours,   R     Signed and Held          Vitals/Pain Today's Vitals   08/24/18 0349 08/24/18 0400 08/24/18 0430 08/24/18 0500  BP:  (!) 150/80 137/84 (!) 141/71  Pulse:  78 78 88  Resp:  18 20   SpO2:  100% 100% 100%  Weight:      Height:      PainSc: 0-No pain       Isolation Precautions No active isolations  Medications Medications  heparin ADULT infusion 100 units/mL (25000 units/213mL sodium chloride 0.45%) (1,200 Units/hr Intravenous New Bag/Given 08/24/18 0346)  ipratropium-albuterol (DUONEB) 0.5-2.5 (3) MG/3ML nebulizer solution (3 mLs  Given 08/24/18 0221)  furosemide (LASIX) injection 40 mg (40 mg Intravenous Given 08/24/18 0221)  heparin bolus via infusion 4,000 Units (4,000 Units Intravenous  Bolus from Bag 08/24/18 0344)    Mobility With assist

## 2018-08-24 NOTE — Consult Note (Signed)
[facility] Consultation Note  Patient ID: Reginald Franklin, MRN: [id number], DOB/AGE: 77-Nov-1942 77 y.o. Admit date: 08/24/2018   Date of Consult: 08/24/2018 Primary Physician: Marguarite ArbourSparks, Jeffrey D, MD Primary Cardiologist: Lady GaryFath  Chief Complaint:  Chief Complaint  Patient presents with  . Shortness of Breath   Reason for Consult: Chest pain  HPI: 77 y.o. male with known coronary disease status post coronary bypass graft in the remote past and PCI and stent placement of obtuse marginal graft in 2017 with known LV systolic dysfunction with ejection fraction of 35% and chronic left bundle branch block.  The patient has had significant new onset of severe shortness of breath weakness fatigue with and without physical activity and was seen in the emergency room at that time.  Patient had a left bundle branch block with first-degree AV block and some pulmonary edema by chest x-ray.  He was given the appropriate medication management and feels much better at this time with less chest pain pressure and shortness of breath.  He does have an elevated troponin of 3.2 most consistent with non-ST elevation myocardial infarction.  Currently he is hemodynamically stable  Past Medical History:  Diagnosis Date  . Anginal pain (HCC)   . BPH (benign prostatic hyperplasia)   . Coronary artery disease   . Diabetes mellitus without complication [facility](HCC)    Patient takes Metformin.  . Erosive gastropathy   . Gastritis   . Hemorrhoids   . Hiatal hernia   . Hyperlipidemia   . Hypertension   . Nasal polyp   . Peripheral vascular disease (HCC)   . S/P CABG x 3   . Seasonal allergies   . Stroke [facility](HCC)       Surgical History:  Past Surgical History:  Procedure Laterality Date  . CARDIAC CATHETERIZATION N/A 06/10/2016   Procedure: Coronary/Graft Angiography;  Surgeon: Dalia HeadingKenneth A Fath, MD;  Location: [facility] INVASIVE CV LAB;  Service: Cardiovascular;  Laterality: N/A;  . CARDIAC CATHETERIZATION N/A  06/10/2016   Procedure: Coronary Stent Intervention;  Surgeon: Marcina MillardAlexander Paraschos, MD;  Location: [facility] INVASIVE CV LAB;  Service: Cardiovascular;  Laterality: N/A;  . CAROTID ENDARTERECTOMY    . CORONARY ARTERY BYPASS GRAFT    . HERNIA REPAIR    . TONSILLECTOMY       Home Meds: Prior to Admission medications   Medication Sig Start Date End Date Taking? Authorizing Provider  amLODipine (NORVASC) 5 MG tablet Take 5 mg by mouth daily. 07/31/18  Yes [provider]  aspirin EC 325 MG EC tablet Take 1 tablet (325 mg total) by mouth daily. 06/11/16  Yes Dalia HeadingFath, Kenneth A, MD  atorvastatin (LIPITOR) 20 MG tablet Take 20 mg by mouth daily. 08/10/18  Yes [provider]  carvedilol (COREG) 6.25 MG tablet Take 6.25 mg by mouth 2 (two) times daily with a meal.   Yes [provider]  cetirizine (ZYRTEC) 10 MG tablet Take 10 mg by mouth at bedtime.   Yes [provider]  Cholecalciferol 25 MCG (1000 UT) tablet Take 1,000 Units by mouth daily.    Yes [provider]  clopidogrel (PLAVIX) 75 MG tablet Take 1 tablet (75 mg total) by mouth daily with breakfast. 06/11/16  Yes Fath, Darlin PriestlyKenneth A, MD  ezetimibe (ZETIA) 10 MG tablet Take 10 mg by mouth at bedtime.   Yes [provider]  fluticasone (FLONASE) 50 MCG/ACT nasal spray Place 2 sprays into both nostrils daily as needed for rhinitis.    Yes [provider]  furosemide (LASIX) 40 MG tablet Take 40 mg by mouth daily as needed for fluid.    Yes [provider]  isosorbide mononitrate (IMDUR) 30 MG 24 hr tablet Take 30 mg by mouth daily. 08/10/18  Yes [provider]  Lutein 20 MG TABS Take 1 tablet by mouth daily.    Yes [provider]  meloxicam (MOBIC) 7.5 MG tablet Take 7.5 mg by mouth daily.   Yes [provider]  metFORMIN (GLUCOPHAGE) 1000 MG tablet Take 1,000 mg by mouth 2 (two) times daily. 08/16/18  Yes [provider]  Multiple Vitamin  (MULTIVITAMIN) tablet Take 1 tablet by mouth daily.   Yes [provider]  Multiple Vitamins-Minerals (VISION FORMULA EYE HEALTH PO) Take 1 tablet by mouth daily.   Yes [provider]    Inpatient Medications:  . amLODipine  5 mg Oral Daily  . aspirin  325 mg Oral Daily  . atorvastatin  20 mg Oral Daily  . carvedilol  6.25 mg Oral BID WC  . cholecalciferol  1,000 Units Oral Daily  . clopidogrel  75 mg Oral Q breakfast  . docusate sodium  100 mg Oral BID  . ezetimibe  10 mg Oral QHS  . insulin aspart  0-5 Units Subcutaneous QHS  . insulin aspart  0-9 Units Subcutaneous TID WC  . isosorbide mononitrate  30 mg Oral Daily  . loratadine  10 mg Oral Daily  . multivitamin with minerals  1 tablet Oral Daily  . multivitamin-lutein  1 capsule Oral Daily   . heparin 1,200 Units/hr (08/24/18 0346)    Allergies:  Allergies  Allergen Reactions  . Codeine Nausea And Vomiting    Social History   Socioeconomic History  . Marital status: Married    Spouse name: Not on file  . Number of children: Not on file  . Years of education: Not on file  . Highest education level: Not on file  Occupational History  . Not on file  Social Needs  . Financial resource strain: Not on file  . Food insecurity:    Worry: Not on file    Inability: Not on file  . Transportation needs:    Medical: Not on file    Non-medical: Not on file  Tobacco Use  . Smoking status: Never Smoker  . Smokeless tobacco: Never Used  Substance and Sexual Activity  . Alcohol use: No  . Drug use: No  . Sexual activity: Not on file  Lifestyle  . Physical activity:    Days per week: Not on file    Minutes per session: Not on file  . Stress: Not on file  Relationships  . Social connections:    Talks on phone: Not on file    Gets together: Not on file    Attends religious service: Not on file    Active member of club or organization: Not on file    Attends meetings of clubs or organizations: Not on  file    Relationship status: Not on file  . Intimate partner violence:    Fear of current or ex partner: Not on file    Emotionally abused: Not on file    Physically abused: Not on file    Forced sexual activity: Not on file  Other Topics Concern  . Not on file  Social History Narrative  . Not on file     No family history on file.   Review of Systems Positive for shortness  of breath chest pain Negative for: General:  chills, fever, night sweats or weight changes.  Cardiovascular: PND orthopnea syncope dizziness  Dermatological skin lesions rashes Respiratory: Cough congestion Urologic: Frequent urination urination at night and hematuria Abdominal: negative for nausea, vomiting, diarrhea, bright red blood per rectum, melena, or hematemesis Neurologic: negative for visual changes, and/or hearing changes  All other systems reviewed and are otherwise negative except as noted above.  Labs: Recent Labs    08/24/18 0220 08/24/18 0642  TROPONINI 0.20* 3.26*   Lab Results  Component Value Date   WBC 11.6 (H) 08/24/2018   HGB 13.0 08/24/2018   HCT 40.4 08/24/2018   MCV 92.9 08/24/2018   PLT 252 08/24/2018    Recent Labs  Lab 08/24/18 0220  NA 139  K 4.2  CL 102  CO2 22  BUN 28*  CREATININE 1.36*  CALCIUM 9.5  GLUCOSE 217*   No results found for: CHOL, HDL, LDLCALC, TRIG No results found for: DDIMER  Radiology/Studies:  Dg Chest Port 1 View  Result Date: 08/24/2018 CLINICAL DATA:  77 year old male with dyspnea. EXAM: PORTABLE CHEST 1 VIEW COMPARISON:  Chest CT dated 05/19/2016 FINDINGS: There is mild cardiomegaly with vascular congestion and interstitial edema. Superimposed pneumonia is not excluded. Clinical correlation is recommended. No focal consolidation, pleural effusion, or pneumothorax. Median sternotomy wires and CABG vascular clips noted. Atherosclerotic calcification of the aortic arch. No acute osseous pathology. IMPRESSION: Cardiomegaly with vascular  congestion and interstitial edema. Superimposed pneumonia is not excluded. Electronically Signed   By: Elgie Collard M.D.   On: 08/24/2018 02:45    EKG: Normal sinus rhythm with first-degree AV block and left bundle branch block  Weights: Filed Weights   08/24/18 0216 08/24/18 0231  Weight: 81.6 kg 80.3 kg     Physical Exam: Blood pressure 134/73, pulse 90, temperature (!) 96.8 F (36 C), temperature source Oral, resp. rate 20, height 6' (1.829 m), weight 80.3 kg, SpO2 98 %. Body mass index is 24.01 kg/m. General: Well developed, well nourished, in no acute distress. Head eyes ears nose throat: Normocephalic, atraumatic, sclera non-icteric, no xanthomas, nares are without discharge. No apparent thyromegaly and/or mass  Lungs: Normal respiratory effort.  no wheezes, few basilar rales, no rhonchi.  Heart: RRR with normal S1 S2. no murmur gallop, no rub, PMI is normal size and placement, carotid upstroke normal without bruit, jugular venous pressure is normal Abdomen: Soft, non-tender, non-distended with normoactive bowel sounds. No hepatomegaly. No rebound/guarding. No obvious abdominal masses. Abdominal aorta is normal size without bruit Extremities: Trace edema. no cyanosis, no clubbing, no ulcers  Peripheral : 2+ bilateral upper extremity pulses, 2+ bilateral femoral pulses, 2+ bilateral dorsal pedal pulse Neuro: Alert and oriented. No facial asymmetry. No focal deficit. Moves all extremities spontaneously. Musculoskeletal: Normal muscle tone without kyphosis Psych:  Responds to questions appropriately with a normal affect.    Assessment: 77 year old male with known cardiovascular disease status post PCI and stent placement coronary bypass graft LV systolic dysfunction and bundle branch block having a non-ST elevation myocardial infarction and acute on chronic systolic dysfunction heart failure now improved  Plan: 1.  Continue heparin for further risk reduction of myocardial  infarction and risk reduction cardiovascular event 2.  Echocardiogram for LV systolic dysfunction and reevaluation of extent of myocardial infarction 3.  Proceed to cardiac catheterization to assess coronary anatomy graft anatomy and further treatment thereof is necessary.  Patient understands risk and benefits of cardiac catheterization this includes the possibility of death  stroke heart attack infection bleeding or blood clot.  He is at low risk for conscious sedation 4.  Continue beta-blocker ACE inhibitor for myocardial infarction LV systolic dysfunction 5.  Lasix for pulmonary edema  Signed, Lamar Blinks M.D. [facility] Cardiology 08/24/2018, 8:29 AM

## 2018-08-24 NOTE — Progress Notes (Signed)
Patient post heart cath per Dr Gwen PoundsKowalski with stent placed to OM1 per Dr Georgia DuffENd,vitals stable. Family at bedside. No bleeding nor hematoma at right groin site. Patient awake, alert and oriented. Dr End out to speak with family with questions answered. Report called to care nurse Mathews Argylehristina Baynes Rn with questions answered.

## 2018-08-24 NOTE — Progress Notes (Signed)
[facility] Cardiology [facility] Encounter Note  Patient: RADEK CARNERO / Admit Date: 08/24/2018 / Date of Encounter: 08/24/2018, 5:24 PM   Subjective: Patient with resolution of chest pain after appropriate medication management Troponin peaked 13 consistent with posterior lateral myocardial infarction Cardiac catheterization showing akinesis of the inferior and inferoapical myocardium with ejection fraction of 35% Previously occluded graft to right coronary artery, LAD, right coronary artery, left circumflex Patent LIMA to the LAD Stenosis of graft to obtuse marginal 1 has culprit lesion  Review of Systems: Positive for: Shortness of breath chest pain Negative for: Vision change, hearing change, syncope, dizziness, nausea, vomiting,diarrhea, bloody stool, stomach pain, cough, congestion, diaphoresis, urinary frequency, urinary pain,skin lesions, skin rashes Others previously listed  Objective: Telemetry: Sinus rhythm with bundle branch block Physical Exam: Blood pressure 103/85, pulse 70, temperature 98.4 F (36.9 C), temperature source Oral, resp. rate 19, height 6' (1.829 m), weight 80.3 kg, SpO2 97 %. Body mass index is 24.01 kg/m. General: Well developed, well nourished, in no acute distress. Head: Normocephalic, atraumatic, sclera non-icteric, no xanthomas, nares are without discharge. Neck: No apparent masses Lungs: Normal respirations with no wheezes, no rhonchi, no rales , no crackles   Heart: Regular rate and rhythm, normal S1 S2, no murmur, no rub, no gallop, PMI is normal size and placement, carotid upstroke normal without bruit, jugular venous pressure normal Abdomen: Soft, non-tender, non-distended with normoactive bowel sounds. No hepatosplenomegaly. Abdominal aorta is normal size without bruit Extremities: No edema, no clubbing, no cyanosis, no ulcers,  Peripheral: 2+ radial, 2+ femoral, 2+ dorsal pedal pulses Neuro: Alert and oriented. Moves all extremities  spontaneously. Psych:  Responds to questions appropriately with a normal affect.   Intake/Output Summary (Last 24 hours) at 08/24/2018 1724 Last data filed at 08/24/2018 1505 Gross per 24 hour  Intake 500.53 ml  Output 1625 ml  Net -1124.47 ml    Inpatient Medications:  . [MAR Hold] amLODipine  5 mg Oral Daily  . [START ON 08/25/2018] aspirin  81 mg Oral Pre-Cath  . [MAR Hold] aspirin  325 mg Oral Daily  . [MAR Hold] atorvastatin  20 mg Oral Daily  . [MAR Hold] carvedilol  6.25 mg Oral BID WC  . [MAR Hold] cholecalciferol  1,000 Units Oral Daily  . [MAR Hold] clopidogrel  75 mg Oral Q breakfast  . [MAR Hold] docusate sodium  100 mg Oral BID  . [MAR Hold] ezetimibe  10 mg Oral QHS  . [MAR Hold] insulin aspart  0-5 Units Subcutaneous QHS  . [MAR Hold] insulin aspart  0-9 Units Subcutaneous TID WC  . [MAR Hold] isosorbide mononitrate  30 mg Oral Daily  . [MAR Hold] loratadine  10 mg Oral Daily  . [MAR Hold] multivitamin with minerals  1 tablet Oral Daily  . [MAR Hold] multivitamin-lutein  1 capsule Oral Daily  . sodium chloride flush  3 mL Intravenous Q12H   Infusions:  . sodium chloride    . [START ON 08/25/2018] sodium chloride     Followed by  . [START ON 08/25/2018] sodium chloride 1 mL/kg/hr (08/24/18 1505)  . heparin Stopped (08/24/18 1456)    Labs: Recent Labs    08/24/18 0220  NA 139  K 4.2  CL 102  CO2 22  GLUCOSE 217*  BUN 28*  CREATININE 1.36*  CALCIUM 9.5   No results for input(s): AST, ALT, ALKPHOS, BILITOT, PROT, ALBUMIN in the last 72 hours. Recent Labs    08/24/18 0220 08/24/18 0642  WBC  14.1* 11.6*  HGB 13.8 13.0  HCT 43.2 40.4  MCV 92.3 92.9  PLT 279 252   Recent Labs    08/24/18 0220 08/24/18 0642 08/24/18 1152  TROPONINI 0.20* 3.26* 13.50*   Invalid input(s): POCBNP No results for input(s): HGBA1C in the last 72 hours.   Weights: Filed Weights   08/24/18 0216 08/24/18 0231  Weight: 81.6 kg 80.3 kg     Radiology/Studies:  Dg  Chest Port 1 View  Result Date: 08/24/2018 CLINICAL DATA:  77 year old male with dyspnea. EXAM: PORTABLE CHEST 1 VIEW COMPARISON:  Chest CT dated 05/19/2016 FINDINGS: There is mild cardiomegaly with vascular congestion and interstitial edema. Superimposed pneumonia is not excluded. Clinical correlation is recommended. No focal consolidation, pleural effusion, or pneumothorax. Median sternotomy wires and CABG vascular clips noted. Atherosclerotic calcification of the aortic arch. No acute osseous pathology. IMPRESSION: Cardiomegaly with vascular congestion and interstitial edema. Superimposed pneumonia is not excluded. Electronically Signed   By: Elgie CollardArash  Radparvar M.D.   On: 08/24/2018 02:45     Assessment and Recommendation  77 y.o. male with known cardiovascular disease status post coronary bypass graft in the remote past with acute non-ST elevation myocardial infarction and acute on chronic systolic dysfunction congestive heart failure with cardiac catheterization showing new culprit lesion of distal saphenous vein graft to obtuse marginal 1  1.  PCI and stent placement of distal venous vein graft to obtuse marginal 1 2.  Dual antiplatelet therapy 3.  Beta-blocker ACE inhibitor for cardial infarction LV systolic dysfunction 4.  Cardiac rehabilitation  Signed, Arnoldo HookerBruce Mosetta Ferdinand M.D. FACC

## 2018-08-24 NOTE — Consult Note (Signed)
Name: Reginald Franklin MRN: [id number] DOB: Jul 10, 1941    ADMISSION DATE:  08/24/2018 CONSULTATION DATE: 08/24/2018  REFERRING MD : Dr. Sheryle Hailiamond   CHIEF COMPLAINT: Shortness of Breath   BRIEF PATIENT DESCRIPTION:  77 yo male admitted with NSTEMI and acute on chronic hypoxic respiratory failure secondary to acute CHF exacerbation requiring Bipap   SIGNIFICANT EVENTS  12/3-Pt admitted to the stepdown unit on Bipap   HISTORY OF PRESENT ILLNESS:   This is a 77 yo male with a PMH of Stroke, Seasonal Allergies, CABG, PVD, Nasal Polyp, Chronic LBBB, HTN, Hyperlipidemia, Hiatal Hernia, Hemorrhoids, Erosive Gastropathy, Type II Diabetes Mellitus, CAD, BPH, Chronic CHF (EF 30%), Cardiomyopathy, and Anginal Pain.  He [facility] to [facility] ER on 12/3 via EMS with c/o shortness of breath and chest pain.  Pt placed on CPAP by EMS. Upon arrival to the ER due continued respiratory distress he was transitioned to Bipap.  EKG revealed LBBB and ST depression in V4-V6 concerning for possible STEMI, code STEMI initiated by ER physician.  However, per on call Cardiologist Dr. Kirke CorinArida pt did not meet critieria for STEMI recommended diuresis, heparin gtt, and [facility] for possible early cath.  Lab results revealed glucose 217, BUN 28, creatinine 1.36, BNP 490, troponin 0.20, and wbc 14.1. CXR showed pulmonary edema.  He received 40 mg iv lasix x1 dose and heparin bolus with initiation of heparin gtt.  He was subsequently admitted to the stepdown unit by hospitalist team for additional workup and treatment.   PAST MEDICAL HISTORY :   has a past medical history of Anginal pain (HCC), BPH (benign prostatic hyperplasia), Coronary artery disease, Diabetes mellitus without complication (HCC), Erosive gastropathy, Gastritis, Hemorrhoids, Hiatal hernia, Hyperlipidemia, Hypertension, Nasal polyp, Peripheral vascular disease (HCC), S/P CABG x 3, Seasonal allergies, and Stroke (HCC).  has a past surgical history that  includes Coronary artery bypass graft; Carotid endarterectomy; Hernia repair; Tonsillectomy; Cardiac catheterization (N/A, 06/10/2016); and Cardiac catheterization (N/A, 06/10/2016). Prior to Admission medications   Medication Sig Start Date End Date Taking? Authorizing Provider  amLODipine (NORVASC) 5 MG tablet Take 5 mg by mouth daily. 07/31/18  Yes [provider]  aspirin EC 325 MG EC tablet Take 1 tablet (325 mg total) by mouth daily. 06/11/16  Yes Dalia HeadingFath, Kenneth A, MD  atorvastatin (LIPITOR) 20 MG tablet Take 20 mg by mouth daily. 08/10/18  Yes [provider]  carvedilol (COREG) 6.25 MG tablet Take 6.25 mg by mouth 2 (two) times daily with a meal.   Yes [provider]  cetirizine (ZYRTEC) 10 MG tablet Take 10 mg by mouth at bedtime.   Yes [provider]  Cholecalciferol 25 MCG (1000 UT) tablet Take 1,000 Units by mouth daily.    Yes [provider]  clopidogrel (PLAVIX) 75 MG tablet Take 1 tablet (75 mg total) by mouth daily with breakfast. 06/11/16  Yes Fath, Darlin PriestlyKenneth A, MD  ezetimibe (ZETIA) 10 MG tablet Take 10 mg by mouth at bedtime.   Yes [provider]  fluticasone (FLONASE) 50 MCG/ACT nasal spray Place 2 sprays into both nostrils daily as needed for rhinitis.    Yes [provider]  furosemide (LASIX) 40 MG tablet Take 40 mg by mouth daily as needed for fluid.    Yes [provider]  isosorbide mononitrate (IMDUR) 30 MG 24 hr tablet Take 30 mg by mouth daily. 08/10/18  Yes [provider]  Lutein 20 MG TABS Take 1 tablet by mouth daily.  Yes [provider]  meloxicam (MOBIC) 7.5 MG tablet Take 7.5 mg by mouth daily.   Yes [provider]  metFORMIN (GLUCOPHAGE) 1000 MG tablet Take 1,000 mg by mouth 2 (two) times daily. 08/16/18  Yes [provider]  Multiple Vitamin (MULTIVITAMIN) tablet Take 1 tablet by mouth daily.   Yes [provider]  Multiple Vitamins-Minerals  (VISION FORMULA EYE HEALTH PO) Take 1 tablet by mouth daily.   Yes [provider]   Allergies  Allergen Reactions  . Codeine Nausea And Vomiting    FAMILY HISTORY:  family history is not on file. SOCIAL HISTORY:  reports that he has never smoked. He has never used smokeless tobacco. He reports that he does not drink alcohol or use drugs.  REVIEW OF SYSTEMS: Positives in BOLD  Constitutional: Negative for fever, chills, weight loss, malaise/fatigue and diaphoresis.  HENT: Negative for hearing loss, ear pain, nosebleeds, congestion, sore throat, neck pain, tinnitus and ear discharge.   Eyes: Negative for blurred vision, double vision, photophobia, pain, discharge and redness.  Respiratory: cough, hemoptysis, sputum production, shortness of breath, wheezing and stridor.   Cardiovascular: chest pain, palpitations, orthopnea, claudication, leg swelling and PND.  Gastrointestinal: Negative for heartburn, nausea, vomiting, abdominal pain, diarrhea, constipation, blood in stool and melena.  Genitourinary: Negative for dysuria, urgency, frequency, hematuria and flank pain.  Musculoskeletal: Negative for myalgias, back pain, joint pain and falls.  Skin: Negative for itching and rash.  Neurological: Negative for dizziness, tingling, tremors, sensory change, speech change, focal weakness, seizures, loss of consciousness, weakness and headaches.  Endo/Heme/Allergies: Negative for environmental allergies and polydipsia. Does not bruise/bleed easily.  SUBJECTIVE:  Pt states breathing has improved  VITAL SIGNS: Pulse Rate:  [72-88] 72 (12/03 0230) Resp:  [26-36] 26 (12/03 0230) BP: (127-157)/(88-93) 157/92 (12/03 0230) SpO2:  [82 %-99 %] 99 % (12/03 0230) Weight:  [80.3 kg-81.6 kg] 80.3 kg (12/03 0231)  PHYSICAL EXAMINATION: General: well developed, well nourished male, NAD on Bipap  Neuro: alert and oriented, follows commands HEENT: supple, no JVD  Cardiovascular: sinus arrhythmia,  no R/G Lungs: faint crackles bilateral bases, even, non labored  Abdomen: +BS x4, obese, soft, non tender, non distended Musculoskeletal: normal bulk and tone, no edema  Skin: intact no rashes or lesions present   Recent Labs  Lab 08/24/18 0220  NA 139  K 4.2  CL 102  CO2 22  BUN 28*  CREATININE 1.36*  GLUCOSE 217*   Recent Labs  Lab 08/24/18 0220  HGB 13.8  HCT 43.2  WBC 14.1*  PLT 279   Dg Chest Port 1 View  Result Date: 08/24/2018 CLINICAL DATA:  77 year old male with dyspnea. EXAM: PORTABLE CHEST 1 VIEW COMPARISON:  Chest CT dated 05/19/2016 FINDINGS: There is mild cardiomegaly with vascular congestion and interstitial edema. Superimposed pneumonia is not excluded. Clinical correlation is recommended. No focal consolidation, pleural effusion, or pneumothorax. Median sternotomy wires and CABG vascular clips noted. Atherosclerotic calcification of the aortic arch. No acute osseous pathology. IMPRESSION: Cardiomegaly with vascular congestion and interstitial edema. Superimposed pneumonia is not excluded. Electronically Signed   By: Elgie Collard M.D.   On: 08/24/2018 02:45    ASSESSMENT / PLAN:  Acute on chronic hypoxic respiratory failure secondary to acute CHF exacerbation  Supplemental O2 for dyspnea and/or hypoxia  Prn bronchodilator therapy   Acute CHF exacerbation NSTEMI Hx: HTN, Cardiomyopathy, Chronic LBBB, CABG, and Stroke Continuous telemetry monitoring Trend troponin's  Continue outpatient amlodipine, aspirin, atorvastatin, carvedilol, imdur, zetia, and  plavix  IV lasix  Heparin gtt-dosing per pharmacy Echo pending Cardiology consulted appreciate input   Acute renal failure  Trend BMP  Replace electrolytes as indicated  Monitor UOP Avoid nephrotoxic medications   Type II Diabetes Mellitus CBG's ac/hs SSI  Sonda Rumble, AGNP  Pulmonary/Critical Care Pager [phone] (please enter 7 digits) PCCM Consult Pager [phone] (please enter 7  digits)

## 2018-08-24 NOTE — Progress Notes (Signed)
Code STEMI cancellation: History of CAD s/p CABG and PCI of SVG to om in 2017 with an EF of 35% and chronic LBBB. Presented with shortness of breath and chest pain. He is hypoxic requiring Bipap.   ECG is abnormal with LBBB and ST depression mostly in V4-V6. This does not meet criteria for STEMI although difficult in setting of underlying LBBB.  Recommend diuresis, Heparin drip and consult Dr. Lady GaryFath for early cath in am depending on course.

## 2018-08-24 NOTE — ED Notes (Signed)
Pt brought in via ems from home.  Pt has sob for 1 day.  Sx worse tonight when lying down.  Hx copd and chf.  No n/v/d.  Pt alert on arrival  Iv started and labs sent.  Cardiac pads in place.

## 2018-08-24 NOTE — ED Notes (Signed)
RT in with pt.  Pt placed on bipap.  Code stemi called by dr brown.

## 2018-08-24 NOTE — H&P (Signed)
Reginald Franklin is an 77 y.o. male.   Chief Complaint: Shortness of breath HPI: The patient with past medical history of coronary artery disease status post CABG, stroke, hypertension, diabetes and hyperlipidemia presents to the emergency department due to shortness of breath.  The patient's wife states that he has had dyspnea and cough for approximately 2 weeks but tonight he became diaphoretic and significant a more short of breath.  He was placed on BiPAP in the emergency department.  Chest x-ray showed some pulmonary edema and the patient was given Lasix 40 mg IV which he diuresed well.  He reports that he no longer has chest pain but that at the onset of severe shortness of breath he felt as if somebody were intermittently sitting on his chest.  Troponin was found to be elevated.  He was also found to have significant ST depressions on EKG.  Heparin drip was started prior to the emergency department staff calling the hospitalist service for admission.  Past Medical History:  Diagnosis Date  . Anginal pain (HCC)   . BPH (benign prostatic hyperplasia)   . Coronary artery disease   . Diabetes mellitus without complication [facility])    Patient takes Metformin.  . Erosive gastropathy   . Gastritis   . Hemorrhoids   . Hiatal hernia   . Hyperlipidemia   . Hypertension   . Nasal polyp   . Peripheral vascular disease (HCC)   . S/P CABG x 3   . Seasonal allergies   . Stroke [facility]     Past Surgical History:  Procedure Laterality Date  . CARDIAC CATHETERIZATION N/A 06/10/2016   Procedure: Coronary/Graft Angiography;  Surgeon: Dalia Heading, MD;  Location: [facility] INVASIVE CV LAB;  Service: Cardiovascular;  Laterality: N/A;  . CARDIAC CATHETERIZATION N/A 06/10/2016   Procedure: Coronary Stent Intervention;  Surgeon: Marcina Millard, MD;  Location: [facility] INVASIVE CV LAB;  Service: Cardiovascular;  Laterality: N/A;  . CAROTID ENDARTERECTOMY    . CORONARY ARTERY BYPASS GRAFT    . HERNIA REPAIR    .  TONSILLECTOMY      No family history on file. Social History:  reports that he has never smoked. He has never used smokeless tobacco. He reports that he does not drink alcohol or use drugs.  Allergies:  Allergies  Allergen Reactions  . Codeine Nausea And Vomiting    Medications Prior to Admission  Medication Sig Dispense Refill  . amLODipine (NORVASC) 5 MG tablet Take 5 mg by mouth daily.  11  . aspirin EC 325 MG EC tablet Take 1 tablet (325 mg total) by mouth daily. 30 tablet 0  . atorvastatin (LIPITOR) 20 MG tablet Take 20 mg by mouth daily.  11  . carvedilol (COREG) 6.25 MG tablet Take 6.25 mg by mouth 2 (two) times daily with a meal.    . cetirizine (ZYRTEC) 10 MG tablet Take 10 mg by mouth at bedtime.    . Cholecalciferol 25 MCG (1000 UT) tablet Take 1,000 Units by mouth daily.     . clopidogrel (PLAVIX) 75 MG tablet Take 1 tablet (75 mg total) by mouth daily with breakfast. 30 tablet 11  . ezetimibe (ZETIA) 10 MG tablet Take 10 mg by mouth at bedtime.    . fluticasone (FLONASE) 50 MCG/ACT nasal spray Place 2 sprays into both nostrils daily as needed for rhinitis.     . furosemide (LASIX) 40 MG tablet Take 40 mg by mouth daily as needed for fluid.     Marland Kitchen  isosorbide mononitrate (IMDUR) 30 MG 24 hr tablet Take 30 mg by mouth daily.  11  . Lutein 20 MG TABS Take 1 tablet by mouth daily.     . meloxicam (MOBIC) 7.5 MG tablet Take 7.5 mg by mouth daily.    . metFORMIN (GLUCOPHAGE) 1000 MG tablet Take 1,000 mg by mouth 2 (two) times daily.  10  . Multiple Vitamin (MULTIVITAMIN) tablet Take 1 tablet by mouth daily.    . Multiple Vitamins-Minerals (VISION FORMULA EYE HEALTH PO) Take 1 tablet by mouth daily.      Results for orders placed or performed during the hospital encounter of 08/24/18 (from the past 48 hour(s))  Basic metabolic panel     Status: Abnormal   Collection Time: 08/24/18  2:20 AM  Result Value Ref Range   Sodium 139 135 - 145 mmol/L   Potassium 4.2 3.5 - 5.1 mmol/L    Chloride 102 98 - 111 mmol/L   CO2 22 22 - 32 mmol/L   Glucose, Bld 217 (H) 70 - 99 mg/dL   BUN 28 (H) 8 - 23 mg/dL   Creatinine, Ser 1.611.36 (H) 0.61 - 1.24 mg/dL   Calcium 9.5 8.9 - 09.610.3 mg/dL   GFR calc non Af Amer 50 (L) >60 mL/min   GFR calc Af Amer 58 (L) >60 mL/min   Anion gap 15 5 - 15    Comment: Performed at [facility], [address] [place], [place] [address]  CBC     Status: Abnormal   Collection Time: 08/24/18  2:20 AM  Result Value Ref Range   WBC 14.1 (H) 4.0 - 10.5 K/uL   RBC 4.68 4.22 - 5.81 MIL/uL   Hemoglobin 13.8 13.0 - 17.0 g/dL   HCT 98.143.2 19.139.0 - 47.852.0 %   MCV 92.3 80.0 - 100.0 fL   MCH 29.5 26.0 - 34.0 pg   MCHC 31.9 30.0 - 36.0 g/dL   RDW 29.516.9 (H) 62.111.5 - 30.815.5 %   Platelets 279 150 - 400 K/uL   nRBC 0.0 0.0 - 0.2 %    Comment: Performed at [facility], [address] [place], [place] [address]  Troponin I - ONCE - STAT     Status: Abnormal   Collection Time: 08/24/18  2:20 AM  Result Value Ref Range   Troponin I 0.20 (HH) <0.03 ng/mL    Comment: CRITICAL RESULT CALLED TO, READ BACK BY AND VERIFIED WITH @0304  08/24/18 AKT Performed at [facility], [address] [place], [place] [address]   Brain natriuretic peptide     Status: Abnormal   Collection Time: 08/24/18  2:20 AM  Result Value Ref Range   B Natriuretic Peptide 490.0 (H) 0.0 - 100.0 pg/mL    Comment: Performed at [facility], [address] [place], [place] [address]  MRSA PCR Screening     Status: None   Collection Time: 08/24/18  6:02 AM  Result Value Ref Range   MRSA by PCR NEGATIVE NEGATIVE    Comment:        The GeneXpert MRSA Assay (FDA approved for NASAL specimens only), is one component of a comprehensive MRSA colonization surveillance program. It is not intended to diagnose MRSA infection nor to guide or monitor treatment for MRSA infections. Performed at [facility], [address] [place], [place] [address]   CBC      Status: Abnormal   Collection Time: 08/24/18  6:42 AM  Result Value Ref Range  WBC 11.6 (H) 4.0 - 10.5 K/uL   RBC 4.35 4.22 - 5.81 MIL/uL   Hemoglobin 13.0 13.0 - 17.0 g/dL   HCT 16.1 09.6 - 04.5 %   MCV 92.9 80.0 - 100.0 fL   MCH 29.9 26.0 - 34.0 pg   MCHC 32.2 30.0 - 36.0 g/dL   RDW 40.9 (H) 81.1 - 91.4 %   Platelets 252 150 - 400 K/uL   nRBC 0.0 0.0 - 0.2 %    Comment: Performed at [facility], [address] [place], [place] [address]  Protime-INR     Status: None   Collection Time: 08/24/18  6:42 AM  Result Value Ref Range   Prothrombin Time 14.7 11.4 - 15.2 seconds   INR 1.16     Comment: Performed at [facility], [address] [place], [place] [address]  APTT     Status: Abnormal   Collection Time: 08/24/18  6:42 AM  Result Value Ref Range   aPTT 112 (H) 24 - 36 seconds    Comment:        IF BASELINE aPTT IS ELEVATED, SUGGEST PATIENT RISK ASSESSMENT BE USED TO DETERMINE APPROPRIATE ANTICOAGULANT THERAPY. Performed at [facility], [address] [place], [place] [address]   TSH     Status: None   Collection Time: 08/24/18  6:42 AM  Result Value Ref Range   TSH 3.247 0.350 - 4.500 uIU/mL    Comment: Performed by a 3rd Generation assay with a functional sensitivity of <=0.01 uIU/mL. Performed at [facility], [address] [place], [place] [address]   Troponin I - Now Then Q6H     Status: Abnormal   Collection Time: 08/24/18  6:42 AM  Result Value Ref Range   Troponin I 3.26 (HH) <0.03 ng/mL    Comment: CRITICAL RESULT CALLED TO, READ BACK BY AND VERIFIED WITH KRISTINA BAYNES@0750  ON 08/24/18 BY HKP Performed at [facility], [address] [place], [place] [address]    Dg Chest Port 1 View  Result Date: 08/24/2018 CLINICAL DATA:  77 year old male with dyspnea. EXAM: PORTABLE CHEST 1 VIEW COMPARISON:  Chest CT dated 05/19/2016 FINDINGS: There is mild cardiomegaly with vascular congestion and  interstitial edema. Superimposed pneumonia is not excluded. Clinical correlation is recommended. No focal consolidation, pleural effusion, or pneumothorax. Median sternotomy wires and CABG vascular clips noted. Atherosclerotic calcification of the aortic arch. No acute osseous pathology. IMPRESSION: Cardiomegaly with vascular congestion and interstitial edema. Superimposed pneumonia is not excluded. Electronically Signed   By: Elgie Collard M.D.   On: 08/24/2018 02:45    Review of Systems  Constitutional: Positive for diaphoresis. Negative for chills and fever.  HENT: Negative for sore throat and tinnitus.   Eyes: Negative for blurred vision and redness.  Respiratory: Positive for shortness of breath. Negative for cough.   Cardiovascular: Positive for chest pain. Negative for palpitations, orthopnea and PND.  Gastrointestinal: Negative for abdominal pain, diarrhea, nausea and vomiting.  Genitourinary: Negative for dysuria, frequency and urgency.  Musculoskeletal: Negative for joint pain and myalgias.  Skin: Negative for rash.       No lesions  Neurological: Negative for speech change, focal weakness and weakness.  Endo/Heme/Allergies: Does not bruise/bleed easily.       No temperature intolerance  Psychiatric/Behavioral: Negative for depression and suicidal ideas.    Blood pressure 134/73, pulse 90, temperature (!) 96.8 F (36 C), temperature source Oral, resp. rate 20, height 6' (1.829 m), weight 80.3 kg,  SpO2 98 %. Physical Exam  Constitutional: He is oriented to person, place, and time. He appears well-developed and well-nourished. No distress.  HENT:  Head: Normocephalic and atraumatic.  Mouth/Throat: Oropharynx is clear and moist.  Eyes: Pupils are equal, round, and reactive to light. Conjunctivae and EOM are normal. No scleral icterus.  Neck: Normal range of motion. Neck supple. No JVD present. No tracheal deviation present. No thyromegaly present.  Cardiovascular: Normal rate,  regular rhythm and normal heart sounds. Exam reveals no gallop and no friction rub.  No murmur heard. Respiratory: Breath sounds normal. He is in respiratory distress.  BiPAP  GI: Soft. Bowel sounds are normal. He exhibits no distension. There is no tenderness.  Genitourinary:  Genitourinary Comments: Deferred  Musculoskeletal: Normal range of motion. He exhibits no edema.  Lymphadenopathy:    He has no cervical adenopathy.  Neurological: He is alert and oriented to person, place, and time. No cranial nerve deficit.  Skin: Skin is warm and dry. No rash noted. No erythema.  Psychiatric: He has a normal mood and affect. His behavior is normal. Judgment and thought content normal.     Assessment/Plan This is a 77 year old male admitted for respiratory failure. 1.  Respiratory failure: Acute; with hypoxemia.  Secondary to CHF exacerbation.  Continue BiPAP 2.  CHF: Acute on chronic; systolic.  EF 30% by previous angiogram.  Continue diuresis as needed. 3.  NSTEMI: Continue heparin drip.  The patient is n.p.o. in anticipation of heart catheterization.  Continue Imdur 4.  Hypertension: Uncontrolled; continue amlodipine and carvedilol 5.  CAD: Continue aspirin and Plavix 6.  Diabetes mellitus type 2: Hold metformin.  Sliding scale insulin hospitalized 7.  DVT prophylaxis: Therapeutic anticoagulation 8.  GI prophylaxis: None The patient is a full code.  I personally spent 45 minutes in critical care time with this patient.  Arnaldo Natal, MD 08/24/2018, 7:55 AM

## 2018-08-24 NOTE — Brief Op Note (Signed)
BRIEF CARDIAC CATHETERIZATION NOTE  DATE: 08/24/2018 TIME: 6:33 PM  PATIENT:  Novella RobJames I Ebel  77 y.o. male  PRE-OPERATIVE DIAGNOSIS:  Non-ST elevation myocardial infarction  POST-OPERATIVE DIAGNOSIS:  SAME  PROCEDURE:  Procedure(s): LEFT HEART CATH AND CORS/GRAFTS ANGIOGRAPHY (N/A) CORONARY STENT INTERVENTION (N/A)  SURGEON:  Surgeon(s) and Role: Panel 1:    * Lamar BlinksKowalski, Bruce J, MD - Primary Panel 2:    Yvonne Kendall* Rubi Tooley, MD - Primary  FINDINGS: 1. See Dr. Philemon KingdomKowalski's diagnostic catheterization report for details of intervention. 2. Successful PCI of distal SVG-OM anastomosis using Resolute 2.0 x 15 mm DES (post-dilated proximally to 3.2 mm) with 0% residual stenosis and TIMI-3 flow.  RECOMMENDATIONS: 1. DAPT with ASA and clopidogrel for at least 12 months, ideally longer. 2. Aggressive secondary prevention and evidence based HF therapy.  Yvonne Kendallhristopher Janina Trafton, MD [facility] Pager: [phone]

## 2018-08-24 NOTE — Progress Notes (Signed)
ANTICOAGULATION CONSULT NOTE - Initial Consult  Pharmacy Consult for heparin drip Indication: chest pain/ACS  Allergies  Allergen Reactions  . Codeine Nausea And Vomiting    Patient Measurements: Height: 6' (182.9 cm) Weight: 177 lb (80.3 kg) IBW/kg (Calculated) : 77.6 Heparin Dosing Weight: 80 kg  Vital Signs: BP: 157/92 (12/03 0230) Pulse Rate: 72 (12/03 0230)  Labs: Recent Labs    08/24/18 0220  HGB 13.8  HCT 43.2  PLT 279  CREATININE 1.36*  TROPONINI 0.20*    Estimated Creatinine Clearance: 49.9 mL/min (A) (by C-G formula based on SCr of 1.36 mg/dL (H)).   Medical History: Past Medical History:  Diagnosis Date  . Anginal pain (HCC)   . BPH (benign prostatic hyperplasia)   . Coronary artery disease   . Diabetes mellitus without complication [facility](HCC)    Patient takes Metformin.  . Erosive gastropathy   . Gastritis   . Hemorrhoids   . Hiatal hernia   . Hyperlipidemia   . Hypertension   . Nasal polyp   . Peripheral vascular disease (HCC)   . S/P CABG x 3   . Seasonal allergies   . Stroke [facility](HCC)     Medications:  Scheduled:  . heparin  4,000 Units Intravenous Once    Assessment: Patient admitted for SOB arrives on CPAP from home. Patient has h/o CAD s/p stent placement 2017. Initial trop of 0.20, EKG showing ST elevation in V1 and ST depression in V5 w/ LBBB.  Patient is not on anticoagulation PTA aside from ASA/plavix Patient is being started on heparin drip for ACS  Goal of Therapy:  Heparin level 0.3-0.7 units/ml Monitor platelets by anticoagulation protocol: Yes   Plan:  Will bolus w/ heparin 4000 units IV x 1 Will start rate of 1200 units/hr  Will draw anti-Xa @ 1100 Baseline labs drawn Will monitor daily CBC's and adjust per anti-Xa levels.  Thomasene Rippleavid Hillman Attig, PharmD, BCPS Clinical Pharmacist 08/24/2018

## 2018-08-24 NOTE — ED Notes (Signed)
Wife in with pt and md.

## 2018-08-25 ENCOUNTER — Encounter: Payer: Self-pay | Admitting: Internal Medicine

## 2018-08-25 ENCOUNTER — Inpatient Hospital Stay
Admit: 2018-08-25 | Discharge: 2018-08-25 | Disposition: A | Discharge status: Home / Self Care | Payer: Medicare Other | Attending: Internal Medicine | Admitting: Internal Medicine

## 2018-08-25 LAB — CBC
HCT: 34.9 % — ABNORMAL LOW (ref 39.0–52.0)
Hemoglobin: 11.5 g/dL — ABNORMAL LOW (ref 13.0–17.0)
MCH: 30.3 pg (ref 26.0–34.0)
MCHC: 33 g/dL (ref 30.0–36.0)
MCV: 91.8 fL (ref 80.0–100.0)
Platelets: 220 10*3/uL (ref 150–400)
RBC: 3.8 MIL/uL — ABNORMAL LOW (ref 4.22–5.81)
RDW: 16.9 % — ABNORMAL HIGH (ref 11.5–15.5)
WBC: 6.6 10*3/uL (ref 4.0–10.5)
nRBC: 0 % (ref 0.0–0.2)

## 2018-08-25 LAB — GLUCOSE, CAPILLARY
Glucose-Capillary: 134 mg/dL — ABNORMAL HIGH (ref 70–99)
Glucose-Capillary: 116 mg/dL — ABNORMAL HIGH (ref 70–99)
Glucose-Capillary: 191 mg/dL — ABNORMAL HIGH (ref 70–99)
Glucose-Capillary: 112 mg/dL — ABNORMAL HIGH (ref 70–99)

## 2018-08-25 LAB — BASIC METABOLIC PANEL
Anion gap: 10 (ref 5–15)
BUN: 23 mg/dL (ref 8–23)
CALCIUM: 8.7 mg/dL — AB (ref 8.9–10.3)
CO2: 27 mmol/L (ref 22–32)
Chloride: 105 mmol/L (ref 98–111)
Creatinine, Ser: 1.27 mg/dL — ABNORMAL HIGH (ref 0.61–1.24)
GFR calc Af Amer: >60 mL/min (ref >60)
GFR calc non Af Amer: 54 mL/min — ABNORMAL LOW (ref >60)
GLUCOSE: 121 mg/dL — AB (ref 70–99)
Potassium: 3.2 mmol/L — ABNORMAL LOW (ref 3.5–5.1)
Sodium: 142 mmol/L (ref 135–145)

## 2018-08-25 LAB — ECHOCARDIOGRAM COMPLETE
Height: 72 in
Weight: 2832.47 oz

## 2018-08-25 LAB — MAGNESIUM: Magnesium: 2 mg/dL (ref 1.7–2.4)

## 2018-08-25 LAB — TROPONIN I: Troponin I: 16.2 ng/mL (ref <0.03)

## 2018-08-25 MED ORDER — POTASSIUM CHLORIDE CRYS ER 20 MEQ PO TBCR
40.0000 meq | EXTENDED_RELEASE_TABLET | ORAL | Status: AC
  Administered 2018-08-25 (×2): 40 meq via ORAL
  Filled 2018-08-25 (×2): qty 2

## 2018-08-25 MED ORDER — FUROSEMIDE 20 MG PO TABS
20.0000 mg | ORAL_TABLET | Freq: Every day | ORAL | Status: DC
  Administered 2018-08-25 – 2018-08-26 (×2): 20 mg via ORAL
  Filled 2018-08-25 (×2): qty 1

## 2018-08-25 MED ORDER — ATROPINE SULFATE 1 MG/10ML IJ SOSY: 0.5000 mg | PREFILLED_SYRINGE | INTRAMUSCULAR | Status: DC | PRN

## 2018-08-25 NOTE — Progress Notes (Signed)
Dr. Gwen PoundsKowalski messaged back and gave order to hold morning dose of coreg and okay to transfer patient to tele.

## 2018-08-25 NOTE — Progress Notes (Signed)
Call placed to Cardiologist's pager. Awaiting call back. Endorse to oncoming nurse

## 2018-08-25 NOTE — Care Management (Signed)
Orders present to transfer out of ICU. Continue to monitor to evidence of heart block.  Anticipate discharge within next 24 hours if stable.  On room air. Does not appear to be discharging on any new cost prohibitive meds. No discharge needs have been identified thus far.

## 2018-08-25 NOTE — Progress Notes (Signed)
Dr. Gwen PoundsKowalski present at bedside and RN made MD aware that this morning around 0530 patient had sustained bradycardia rate 35 with 2nd degree heart block and noted that patient has been dropping P waves.  MD acknowledged giving no new orders at this time.

## 2018-08-25 NOTE — Progress Notes (Signed)
[employer] [facility] [place] [facility]   PATIENT NAME: Reginald Franklin    MR#:  [id number]  DATE OF BIRTH:  November 08, 1940  SUBJECTIVE:  CHIEF COMPLAINT:   Chief Complaint  Patient presents with  . Shortness of Breath   - presented with NSTEMI- s/p cath and PCI 08/24/18 - bradycardia last night, asymptomatic now  REVIEW OF SYSTEMS:  Review of Systems  Constitutional: Positive for malaise/fatigue. Negative for chills and fever.  HENT: Negative for congestion, ear discharge, hearing loss and nosebleeds.   Eyes: Negative for blurred vision and double vision.  Respiratory: Positive for shortness of breath. Negative for cough and wheezing.   Cardiovascular: Positive for leg swelling. Negative for chest pain and palpitations.  Gastrointestinal: Negative for abdominal pain, constipation, diarrhea, nausea and vomiting.  Genitourinary: Negative for dysuria.  Musculoskeletal: Negative for myalgias.  Neurological: Negative for dizziness, focal weakness, seizures, weakness and headaches.  Psychiatric/Behavioral: Negative for depression.    DRUG ALLERGIES:   Allergies  Allergen Reactions  . Codeine Nausea And Vomiting    VITALS:  Blood pressure 134/90, pulse 73, temperature (!) 97.4 F (36.3 C), temperature source Oral, resp. rate 17, height 6' (1.829 m), weight 80.3 kg, SpO2 95 %.  PHYSICAL EXAMINATION:  Physical Exam   GENERAL:  77 y.o.-year-old patient lying in the bed with no acute distress.  EYES: Pupils equal, round, reactive to light and accommodation. No scleral icterus. Extraocular muscles intact.  HEENT: Head atraumatic, normocephalic. Oropharynx and nasopharynx clear.  NECK:  Supple, no jugular venous distention. No thyroid enlargement, no tenderness.  LUNGS: Normal breath sounds bilaterally, no wheezing, rales,rhonchi or crepitation. No use of accessory muscles of respiration.  Decreased bibasilar breath sounds. CARDIOVASCULAR: S1, S2 normal. No  rubs, or  gallops.  2/6 systolic murmur is present ABDOMEN: Soft, nontender, nondistended. Bowel sounds present. No organomegaly or mass.  EXTREMITIES: No pedal edema, cyanosis, or clubbing.  NEUROLOGIC: Cranial nerves II through XII are intact. Muscle strength 5/5 in all extremities. Sensation intact. Gait not checked.  PSYCHIATRIC: The patient is alert and oriented x 3.  SKIN: No obvious rash, lesion, or ulcer.    LABORATORY PANEL:   CBC Recent Labs  Lab 08/25/18 0516  WBC 6.6  HGB 11.5*  HCT 34.9*  PLT 220   ------------------------------------------------------------------------------------------------------------------  Chemistries  Recent Labs  Lab 08/25/18 0516  NA 142  K 3.2*  CL 105  CO2 27  GLUCOSE 121*  BUN 23  CREATININE 1.27*  CALCIUM 8.7*  MG 2.0   ------------------------------------------------------------------------------------------------------------------  Cardiac Enzymes Recent Labs  Lab 08/25/18 0516  TROPONINI 16.20*   ------------------------------------------------------------------------------------------------------------------  RADIOLOGY:  Dg Chest Port 1 View  Result Date: 08/24/2018 CLINICAL DATA:  77 year old male with dyspnea. EXAM: PORTABLE CHEST 1 VIEW COMPARISON:  Chest CT dated 05/19/2016 FINDINGS: There is mild cardiomegaly with vascular congestion and interstitial edema. Superimposed pneumonia is not excluded. Clinical correlation is recommended. No focal consolidation, pleural effusion, or pneumothorax. Median sternotomy wires and CABG vascular clips noted. Atherosclerotic calcification of the aortic arch. No acute osseous pathology. IMPRESSION: Cardiomegaly with vascular congestion and interstitial edema. Superimposed pneumonia is not excluded. Electronically Signed   By: Elgie Collard M.D.   On: 08/24/2018 02:45    EKG:   Orders placed or performed during the hospital encounter of 08/24/18  . ED EKG within 10 minutes  . ED EKG  within 10 minutes  . EKG 12-Lead  . EKG 12-Lead  . ED EKG  . ED EKG  .  EKG 12-Lead immediately post procedure  . EKG 12-Lead  . EKG 12-Lead immediately post procedure  . EKG 12-Lead  . EKG 12-Lead  . EKG 12-Lead  . EKG 12-Lead  . EKG 12-Lead  . EKG 12-Lead  . EKG 12-Lead    ASSESSMENT AND PLAN:   77 year old male with past medical history significant for CAD status post CABG, hypertension, diabetes and hyperlipidemia presents to hospital secondary to worsening shortness of breath  1.  NSTEMI- s./p cardiac cath and had PCI to obtuse marginal 1. -Cath showing akinesis of inferior and inferior apical myocardium and EF down to 35% -Appreciate cardiology consult.  Was on heparin drip - Echocardiogram is done -On aspirin, Plavix, statin and Imdur -Coreg held due to nodal AV block. -Can be transferred to telemetry floor and encourage ambulation  2.  Acute on chronic systolic heart failure-last known EF from September 2019 as outpatient of 35%, has ischemic cardiomyopathy. -Continue IV Lasix.  Change to oral tomorrow  3.  Hypertension-on , Imdur and Norvasc Coreg held this morning  4.  Diabetes-on sliding scale insulin  5.  DVT prophylaxis-started on subcutaneous heparin  6.  Hypokalemia-on potassium supplements  Ambulatory at baseline Updated family at bedside   All the records are reviewed and case discussed with Care Management/Social Workerr. Management plans discussed with the patient, family and they are in agreement.  CODE STATUS: Full code  TOTAL TIME TAKING CARE OF THIS PATIENT: 35 minutes.   POSSIBLE D/C IN 1-2 DAYS, DEPENDING ON CLINICAL CONDITION.   Enid BaasKALISETTI,Odes Lolli M.D on 08/25/2018 at 1:54 PM  Between 7am to 6pm - Pager - [phone]  After 6pm go to [email or web address] - Social research officer, governmentpassword EPAS [facility]  [facility]  Office  [phone]  CC: Primary care physician; Marguarite ArbourSparks, Jeffrey D, MD

## 2018-08-25 NOTE — Progress Notes (Signed)
[facility]  Patient: Reginald Franklin / Admit Date: 08/24/2018 / Date of Encounter: 08/25/2018, 9:08 AM   Subjective: Patient with resolution of chest pain after appropriate medication management Troponin peaked 13 consistent with posterior lateral myocardial infarction Cardiac catheterization showing akinesis of the inferior and inferoapical myocardium with ejection fraction of 35% Previously occluded graft to right coronary artery, LAD, right coronary artery, left circumflex Patent LIMA to the LAD Stenosis of graft to obtuse marginal 1 has culprit lesion now with successful PCI and stent placement without complication Telemetry overnight has shown second-degree type I AV block without evidence of further advanced heart block and/or symptoms at this time  Review of Systems: Positive for: None Negative for: Vision change, hearing change, syncope, dizziness, nausea, vomiting,diarrhea, bloody stool, stomach pain, cough, congestion, diaphoresis, urinary frequency, urinary pain,skin lesions, skin rashes Others previously listed  Objective: Telemetry: Sinus rhythm with bundle branch block Physical Exam: Blood pressure 118/77, pulse 67, temperature 98 F (36.7 C), temperature source Oral, resp. rate 15, height 6' (1.829 m), weight 80.3 kg, SpO2 100 %. Body mass index is 24.01 kg/m. General: Well developed, well nourished, in no acute distress. Head: Normocephalic, atraumatic, sclera non-icteric, no xanthomas, nares are without discharge. Neck: No apparent masses Lungs: Normal respirations with no wheezes, no rhonchi, no rales , no crackles   Heart: Regular rate and rhythm, normal S1 S2, no murmur, no rub, no gallop, PMI is normal size and placement, carotid upstroke normal without bruit, jugular venous pressure normal Abdomen: Soft, non-tender, non-distended with normoactive bowel sounds. No hepatosplenomegaly. Abdominal aorta is normal size without  bruit Extremities: No edema, no clubbing, no cyanosis, no ulcers,  Peripheral: 2+ radial, 2+ femoral, 2+ dorsal pedal pulses Neuro: Alert and oriented. Moves all extremities spontaneously. Psych:  Responds to questions appropriately with a normal affect.   Intake/Output Summary (Last 24 hours) at 08/25/2018 0908 Last data filed at 08/25/2018 0400 Gross per 24 hour  Intake 742.84 ml  Output 1200 ml  Net -457.16 ml    Inpatient Medications:  . amLODipine  5 mg Oral Daily  . aspirin  81 mg Oral Daily  . atorvastatin  20 mg Oral Daily  . carvedilol  6.25 mg Oral BID WC  . cholecalciferol  1,000 Units Oral Daily  . clopidogrel  75 mg Oral Q breakfast  . docusate sodium  100 mg Oral BID  . ezetimibe  10 mg Oral QHS  . heparin  5,000 Units Subcutaneous Q8H  . insulin aspart  0-5 Units Subcutaneous QHS  . insulin aspart  0-9 Units Subcutaneous TID WC  . isosorbide mononitrate  30 mg Oral Daily  . loratadine  10 mg Oral Daily  . multivitamin with minerals  1 tablet Oral Daily  . multivitamin-lutein  1 capsule Oral Daily  . sodium chloride flush  3 mL Intravenous Q12H   Infusions:  . sodium chloride      Labs: Recent Labs    08/24/18 0220 08/25/18 0516  NA 139 142  K 4.2 3.2*  CL 102 105  CO2 22 27  GLUCOSE 217* 121*  BUN 28* 23  CREATININE 1.36* 1.27*  CALCIUM 9.5 8.7*  MG  --  2.0   No results for input(s): AST, ALT, ALKPHOS, BILITOT, PROT, ALBUMIN in the last 72 hours. Recent Labs    08/24/18 0642 08/25/18 0516  WBC 11.6* 6.6  HGB 13.0 11.5*  HCT 40.4 34.9*  MCV 92.9 91.8  PLT 252 220  Recent Labs    08/24/18 0642 08/24/18 1152 08/24/18 1940 08/25/18 0516  TROPONINI 3.26* 13.50* 22.55* 16.20*   Invalid input(s): POCBNP No results for input(s): HGBA1C in the last 72 hours.   Weights: Filed Weights   08/24/18 0216 08/24/18 0231 08/25/18 0500  Weight: 81.6 kg 80.3 kg 80.3 kg     Radiology/Studies:  Dg Chest Port 1 View  Result Date:  08/24/2018 CLINICAL DATA:  77 year old male with dyspnea. EXAM: PORTABLE CHEST 1 VIEW COMPARISON:  Chest CT dated 05/19/2016 FINDINGS: There is mild cardiomegaly with vascular congestion and interstitial edema. Superimposed pneumonia is not excluded. Clinical correlation is recommended. No focal consolidation, pleural effusion, or pneumothorax. Median sternotomy wires and CABG vascular clips noted. Atherosclerotic calcification of the aortic arch. No acute osseous pathology. IMPRESSION: Cardiomegaly with vascular congestion and interstitial edema. Superimposed pneumonia is not excluded. Electronically Signed   By: Elgie Collard M.D.   On: 08/24/2018 02:45     Assessment and Recommendation  77 y.o. male with known cardiovascular disease status post coronary bypass graft in the remote past with acute non-ST elevation myocardial infarction and acute on chronic systolic dysfunction congestive heart failure with cardiac catheterization showing new culprit lesion of distal saphenous vein graft to obtuse marginal 1  1.  No further cardiac diagnostics testing and her treatment at this time 2.  Dual antiplatelet therapy 3.  Beta-blocker ACE inhibitor for cardial infarction LV systolic dysfunction as able 4.  Cardiac rehabilitation 5.  Ambulate and follow for improvements of symptoms and/or further concerns of advanced heart block but currently without symptoms with second-degree type I AV block which can be monitored at this time 6.  Ambulating well without symptoms on dual antiplatelet therapy possible discharge to home with follow-up Friday to place monitor to assess for the possibility of further advanced heart block requiring further intervention and or treatment options Signed, Arnoldo Hooker M.D. FACC

## 2018-08-25 NOTE — Progress Notes (Signed)
Patient's is s/p cardiac cath and  heart rhythm noted to changed to 2 degree heart block type 2. Patient is asymptomatic. NP aware and instructed to place Zoll on patient. Cardiology will eval in a.m. Will continue to monitor and endorse.

## 2018-08-25 NOTE — Progress Notes (Signed)
*  PRELIMINARY RESULTS* Echocardiogram 2D Echocardiogram has been performed.  Reginald GulaJoan M Iren Franklin 08/25/2018, 12:40 PM

## 2018-08-25 NOTE — Progress Notes (Signed)
Follow up - Critical Care Medicine Note  Patient Details:    Reginald Franklin is an 77 y.o. male. with a PMH of Stroke, Seasonal Allergies, CABG, PVD, Nasal Polyp, Chronic LBBB, HTN, Hyperlipidemia, Hiatal Hernia, Hemorrhoids, Erosive Gastropathy, Type II Diabetes Mellitus, CAD, BPH,Chronic CHF (EF 30%), Cardiomyopathy,and Anginal Pain. He presented to [facility] ER on 12/3 via EMS with c/o shortness of breath and chest pain. Pt placed on CPAP by EMS. Upon arrival to the ER due continuedrespiratory distress he was transitioned to Bipap. EKG revealed LBBB and ST depression in V4-V6 concerning for possible STEMI, code STEMI initiated by ER physician.  Lines, Airways, Drains:    Anti-infectives:  Anti-infectives (From admission, onward)   None      Microbiology: Results for orders placed or performed during the hospital encounter of 08/24/18  MRSA PCR Screening     Status: None   Collection Time: 08/24/18  6:02 AM  Result Value Ref Range Status   MRSA by PCR NEGATIVE NEGATIVE Final    Comment:        The GeneXpert MRSA Assay (FDA approved for NASAL specimens only), is one component of a comprehensive MRSA colonization surveillance program. It is not intended to diagnose MRSA infection nor to guide or monitor treatment for MRSA infections. Performed at [facility], [address], [place], [place] [address]    Studies: Dg Chest [place] 1 View  Result Date: 08/24/2018 CLINICAL DATA:  77 year old male with dyspnea. EXAM: PORTABLE CHEST 1 VIEW COMPARISON:  Chest CT dated 05/19/2016 FINDINGS: There is mild cardiomegaly with vascular congestion and interstitial edema. Superimposed pneumonia is not excluded. Clinical correlation is recommended. No focal consolidation, pleural effusion, or pneumothorax. Median sternotomy wires and CABG vascular clips noted. Atherosclerotic calcification of the aortic arch. No acute osseous pathology. IMPRESSION: Cardiomegaly with vascular  congestion and interstitial edema. Superimposed pneumonia is not excluded. Electronically Signed   By: Elgie Collard M.D.   On: 08/24/2018 02:45    Consults: Treatment Team:  Pccm, Raymond Gurney, MD Lamar Blinks, MD   Subjective:    Overnight Issues: Patient is status post cardiac catheterization, PCI/DES, has had some episodes of dropped P waves, is presently pain-free and hemodynamically stable  Objective:  Vital signs for last 24 hours: Temp:  [97.6 F (36.4 C)-98.9 F (37.2 C)] 98 F (36.7 C) (12/04 0850) Pulse Rate:  [55-92] 82 (12/04 0900) Resp:  [10-23] 21 (12/04 0900) BP: (103-147)/(51-93) 142/77 (12/04 0900) SpO2:  [94 %-100 %] 98 % (12/04 0900) Weight:  [80.3 kg] 80.3 kg (12/04 0500)  Hemodynamic parameters for last 24 hours:    Intake/Output from previous day: 12/03 0701 - 12/04 0700 In: 742.8 [P.O.:360; I.V.:382.8] Out: 1975 [Urine:1975]  Intake/Output this shift: Total I/O In: -  Out: 175 [Urine:175]  Physical Exam:  Vital signs: Please see the above listed vital signs HEENT: Tacky trachea midline, no thyromegaly appreciated, no accessory muscle utilization Cardiovascular: Regular with intermittent irregularities.  Noted on monitor sinus rhythm with occasionally dropped P waves some appear to be type I  Pulmonary: Clear to auscultation Abdominal: Positive bowel sounds, soft exam Extremities: No clubbing, cyanosis or edema noted Neurologic: No focal deficits appreciated  Assessment/Plan:   Status post PCI/DES.  Please see cardiology note for full details.  Patient with underlying ejection fraction of 35%.  Peak troponin of 13.  Doing well this morning pain-free with stable hemodynamics.  Presently on amlodipine, aspirin, statin, Coreg, Plavix, isosorbide mononitrate  Intermittent type I AV nodal block.  No clear evidence of progression to type II or complete heart block.  Cardiology is following  Congestive heart failure with ejection fraction  reduced to 35%.  Is on IV Lasix, venous 1.2 L diuresis  Diabetes.  On scale coverage  Doing well today most likely will be able to transfer to cards telemetry pending cardiology approval  Tora KindredJohn Rita Prom, DO  Derel Mcglasson 08/25/2018  *Care during the described time interval was provided by me and/or other providers on the critical care team.  I have reviewed this patient's available data, including medical history, events of note, physical examination and test results as part of my evaluation.

## 2018-08-25 NOTE — Progress Notes (Signed)
PHARMACY - CRITICAL CARE PROGRESS NOTE  Pharmacy Consult for electrolyte management Indication: hypokalemia   Allergies  Allergen Reactions  . Codeine Nausea And Vomiting    Patient Measurements: Height: 6' (182.9 cm) Weight: 177 lb 0.5 oz (80.3 kg) IBW/kg (Calculated) : 77.6 Adjusted Body Weight:    Vital Signs: Temp: 98 F (36.7 C) (12/04 0850) Temp Source: Oral (12/04 0850) BP: 123/74 (12/04 1000) Pulse Rate: 74 (12/04 1000) Intake/Output from previous day: 12/03 0701 - 12/04 0700 In: 742.8 [P.O.:360; I.V.:382.8] Out: 1975 [Urine:1975] Intake/Output from this shift: Total I/O In: -  Out: 175 [Urine:175] Vent settings for last 24 hours:    Labs: BMP Latest Ref Rng & Units 08/25/2018 08/24/2018 06/11/2016  Glucose 70 - 99 mg/dL 324(M121(H) 010(U217(H) 725(D123(H)  BUN 8 - 23 mg/dL 23 66(Y28(H) 40(H26(H)  Creatinine 0.61 - 1.24 mg/dL 4.74(Q1.27(H) 5.95(G1.36(H) 3.871.08  Sodium 135 - 145 mmol/L 142 139 141  Potassium 3.5 - 5.1 mmol/L 3.2(L) 4.2 3.8  Chloride 98 - 111 mmol/L 105 102 103  CO2 22 - 32 mmol/L 27 22 31   Calcium 8.9 - 10.3 mg/dL 5.6(E8.7(L) 9.5 9.3    Estimated Creatinine Clearance: 53.5 mL/min (A) (by C-G formula based on SCr of 1.27 mg/dL (H)).  Assessment: Patient is a 77yo male admitted for SOB and chest pain, found to have STEMI and was taken to Cath lab. Patient is s/p PCI and stent placement. Noted to hypokalemic this morning with K of 3.2.  Goal of Therapy:  K ~ 4.0, Mag ~ 2.0  Plan:  Will order KCl 40mEq po q4h x 2 doses. Follow up on AM labs.  Clovia CuffLisa Breniyah Romm, PharmD, BCPS 08/25/2018 10:58 AM

## 2018-08-25 NOTE — Progress Notes (Signed)
Report given to Maralyn SagoSarah, RN who is taking over patient's care.

## 2018-08-25 NOTE — Progress Notes (Signed)
Pt transferred to room 248.  Report called to Tasha, rn.  Pt is alert and oriented with no complaints of pain.  Right femoral site at a level 0.

## 2018-08-26 LAB — BASIC METABOLIC PANEL
ANION GAP: 10 (ref 5–15)
BUN: 21 mg/dL (ref 8–23)
CO2: 26 mmol/L (ref 22–32)
Calcium: 8.9 mg/dL (ref 8.9–10.3)
Chloride: 105 mmol/L (ref 98–111)
Creatinine, Ser: 1.18 mg/dL (ref 0.61–1.24)
GFR calc Af Amer: >60 mL/min (ref >60)
GFR, EST NON AFRICAN AMERICAN: 59 mL/min — AB (ref >60)
Glucose, Bld: 135 mg/dL — ABNORMAL HIGH (ref 70–99)
POTASSIUM: 3.9 mmol/L (ref 3.5–5.1)
Sodium: 141 mmol/L (ref 135–145)

## 2018-08-26 LAB — GLUCOSE, CAPILLARY: Glucose-Capillary: 125 mg/dL — ABNORMAL HIGH (ref 70–99)

## 2018-08-26 LAB — CBC
HCT: 36.2 % — ABNORMAL LOW (ref 39.0–52.0)
Hemoglobin: 11.8 g/dL — ABNORMAL LOW (ref 13.0–17.0)
MCH: 29.9 pg (ref 26.0–34.0)
MCHC: 32.6 g/dL (ref 30.0–36.0)
MCV: 91.9 fL (ref 80.0–100.0)
PLATELETS: 224 10*3/uL (ref 150–400)
RBC: 3.94 MIL/uL — ABNORMAL LOW (ref 4.22–5.81)
RDW: 16.6 % — ABNORMAL HIGH (ref 11.5–15.5)
WBC: 7.5 10*3/uL (ref 4.0–10.5)
nRBC: 0 % (ref 0.0–0.2)

## 2018-08-26 MED ORDER — LOSARTAN POTASSIUM 50 MG PO TABS: 50.0000 mg | ORAL_TABLET | Freq: Every day | ORAL | 11 refills | Status: AC

## 2018-08-26 MED ORDER — ASPIRIN 81 MG PO CHEW: 81.0000 mg | CHEWABLE_TABLET | Freq: Every day | ORAL | 2 refills | Status: AC

## 2018-08-26 MED ORDER — POTASSIUM CHLORIDE CRYS ER 20 MEQ PO TBCR
40.0000 meq | EXTENDED_RELEASE_TABLET | Freq: Once | ORAL | Status: AC
  Administered 2018-08-26: 40 meq via ORAL
  Filled 2018-08-26: qty 2

## 2018-08-26 MED ORDER — ATORVASTATIN CALCIUM 80 MG PO TABS: 80.0000 mg | ORAL_TABLET | Freq: Every day | ORAL | 1 refills | Status: DC

## 2018-08-26 MED ORDER — ATORVASTATIN CALCIUM 40 MG PO TABS: 40.0000 mg | ORAL_TABLET | Freq: Every day | ORAL | 2 refills | Status: DC

## 2018-08-26 MED ORDER — FUROSEMIDE 20 MG PO TABS: 20.0000 mg | ORAL_TABLET | Freq: Every day | ORAL | 1 refills | Status: AC

## 2018-08-26 NOTE — Progress Notes (Signed)
Cardiovascular and Pulmonary Nurse Navigator Note:    77 y.o. male with known cardiovascular disease status post coronary bypass graft in the remote past with acute non-ST elevation myocardial infarction and acute on chronic systolic dysfunction congestive heart failure with cardiac catheterization showing new culprit lesion of distal saphenous vein graft to obtuse marginal 1.  Past Medical History:  Diagnosis Date  . Anginal pain (HCC)   . BPH (benign prostatic hyperplasia)   . Coronary artery disease   . Diabetes mellitus without complication [facility](HCC)    Patient takes Metformin.  . Erosive gastropathy   . Gastritis   . Hemorrhoids   . Hiatal hernia   . Hyperlipidemia   . Hypertension   . Nasal polyp   . Peripheral vascular disease (HCC)   . S/P CABG x 3   . Seasonal allergies   . Stroke [facility](HCC)    Patient sitting in chair and wife at bedside when this RN rounded.    CHF Education previously completed / documented by assigned RN.  [facility]: The role of the HF Clinic was explained to patient and wife.  Patient has new patient appointment  scheduled for 09/06/2018 at 2:40 p.m.    NSTEMI Education:  Discussed modifiable risk factors including controlling blood pressure, cholesterol, and blood sugar; following heart healthy diet; maintaining healthy weight; exercise; and smoking cessation, if applicable.    ? Discussed cardiac medications including rationale for taking, mechanisms of action, and side effects. Stressed the importance of taking medications as prescribed. Discussed with Dr. Nemiah CommanderKalisetti patient's Lipitor dose.  Dose while here in hospital was 20 mg.  Given his NSTEMI and AMI registry guidelines, Lipitor should be increased.  Dr. Nemiah CommanderKalisetti increased Lipitor to 80 mg.   ? Discussed emergency plan for heart attack symptoms. Patient verbalized understanding of need to call 911 and not to drive himself to ER if having cardiac symptoms / chest pain.   ? Smoking Cessation - Patient is a NEVER smoker.    Exercise - Benefits of exercised discussed.  Patient informed this RN that he does not exercise.  This RN informed patient that his cardiologist has referred him to outpatient Cardiac Rehab.  Patient is familiar with Cardiac Rehab, as he completed the Cardiac Rehab program in 2017.  Patient did not continue exercising after completing Cardiac Rehab.  Patient and wife agreed patient should participate in Cardiac Rehab again.  Orientation for Cardiac Rehab was scheduled for September 02, 2018 at 11:30 a.m.    Patient appreciative of the information. ?  ? Army Meliaiane Wright, RN, BSN, [facility]  [place]  [facility] Cardiac & [facility]  Cardiovascular & Pulmonary Nurse Navigator  Direct Line: [phone]  Department Phone #: [phone] Fax: [phone]  Email Address: [email or web address]

## 2018-08-26 NOTE — Evaluation (Signed)
Physical Therapy Evaluation Patient Details Name: Reginald Franklin MRN: [id number] DOB: Jan 04, 1941 Today's Date: 08/26/2018   History of Present Illness  Pt is a 77 year old male with past medical history significant for CAD status post CABG, hypertension, diabetes and hyperlipidemia presented to hospital secondary to worsening shortness of breath.  Assessment includes: NSTEMI s/p cardiac cath and PCI, acute on chronic CHF, HTN, and DM.     Clinical Impression  Pt presented without any functional deficits and was Ind with bed mob, transfers, and gait including ascending and descending steps.  Pt's baseline SpO2 and HR on room air were 96% and 78 bpm.  After amb 100 feet SpO2 was 97% with HR 86 bpm.  After ascending/descending stairs SpO2 was 96% and HR 85 bpm.  Pt reported no adverse symptoms during the session and reported feeling "much better" than upon admission.  Pt reported no needs/desire for further PT services but stated he desired to begin walking for exercise upon returning home.  Extensive education provided regarding physiological benefits of activity and on slow and safe activity progression/weekly activity goals with use of the RPE scale.  Will complete PT orders at this time but will reassess pt pending a change in status upon receipt of new PT orders.      Follow Up Recommendations No PT follow up    Equipment Recommendations  None recommended by PT    Recommendations for Other Services       Precautions / Restrictions Precautions Precautions: None Restrictions Weight Bearing Restrictions: No      Mobility  Bed Mobility Overal bed mobility: Independent                Transfers Overall transfer level: Independent                  Ambulation/Gait Ambulation/Gait assistance: Independent Gait Distance (Feet): 100 Feet Assistive device: None Gait Pattern/deviations: Step-through pattern;Decreased step length - right;Decreased step length - left Gait  velocity: decreased   General Gait Details: Slow cadnence with short B step length but steady without LOB with gait pattern baseline per pt  Stairs Stairs: Yes Stairs assistance: Modified independent (Device/Increase time) Stair Management: One rail Left Number of Stairs: 4 General stair comments: Good eccentric and concentric control and stability  Wheelchair Mobility    Modified Rankin (Stroke Patients Only)       Balance Overall balance assessment: No apparent balance deficits (not formally assessed)                                           Pertinent Vitals/Pain Pain Assessment: No/denies pain    Home Living Family/patient expects to be discharged to:: Private residence Living Arrangements: Spouse/significant other Available Help at Discharge: Family;Available 24 hours/day Type of Home: House Home Access: Ramped entrance;Stairs to enter Entrance Stairs-Rails: Left Entrance Stairs-Number of Steps: 4 steps to enter or a ramp Home Layout: One level Home Equipment: Walker - 2 wheels;Wheelchair - manual;Cane - single point      Prior Function Level of Independence: Independent         Comments: Ind amb limited community distances without an AD, no fall history, Ind with ADLs     Hand Dominance        Extremity/Trunk Assessment   Upper Extremity Assessment Upper Extremity Assessment: Overall WFL for tasks assessed    Lower Extremity Assessment Lower Extremity  Assessment: Overall WFL for tasks assessed       Communication   Communication: No difficulties  Cognition Arousal/Alertness: Awake/alert Behavior During Therapy: WFL for tasks assessed/performed Overall Cognitive Status: Within Functional Limits for tasks assessed                                        General Comments      Exercises Other Exercises Other Exercises: Extensive education provided regarding physiological benefits of activity and safe  activity progression/weekly activity goals with use of the RPE scale   Assessment/Plan    PT Assessment Patent does not need any further PT services  PT Problem List         PT Treatment Interventions      PT Goals (Current goals can be found in the Care Plan section)  Acute Rehab PT Goals PT Goal Formulation: All assessment and education complete, DC therapy    Frequency     Barriers to discharge        Co-evaluation               AM-PAC PT "6 Clicks" Mobility  Outcome Measure Help needed turning from your back to your side while in a flat bed without using bedrails?: None Help needed moving from lying on your back to sitting on the side of a flat bed without using bedrails?: None Help needed moving to and from a bed to a chair (including a wheelchair)?: None Help needed standing up from a chair using your arms (e.g., wheelchair or bedside chair)?: None Help needed to walk in hospital room?: None Help needed climbing 3-5 steps with a railing? : None 6 Click Score: 24    End of Session Equipment Utilized During Treatment: Gait belt Activity Tolerance: Patient tolerated treatment well Patient left: in chair;with family/visitor present;with call bell/phone within reach Nurse Communication: Mobility status PT Visit Diagnosis: Muscle weakness (generalized) (M62.81)    Time: 4098-1191 PT Time Calculation (min) (ACUTE ONLY): 22 min   Charges:   PT Evaluation $PT Eval Low Complexity: 1 Low PT Treatments $Therapeutic Activity: 8-22 mins        D. Scott Moody Robben PT, DPT 08/26/18, 10:52 AM

## 2018-08-26 NOTE — Progress Notes (Signed)
[facility] Encounter Note  Patient: Reginald Franklin / Admit Date: 08/24/2018 / Date of Encounter: 08/26/2018, 9:00 AM   Subjective: Patient with resolution of chest pain after appropriate medication management Troponin peaked 13 consistent with posterior lateral myocardial infarction Cardiac catheterization showing akinesis of the inferior and inferoapical myocardium with ejection fraction of 35% Previously occluded graft to right coronary artery, LAD, right coronary artery, left circumflex Patent LIMA to the LAD Stenosis of graft to obtuse marginal 1 has culprit lesion now with successful PCI and stent placement without complication Telemetry overnight has shown second-degree type I AV block without evidence of further advanced heart block and/or symptoms at this time and currently the patient has had no evidence of significant symptoms from this  Review of Systems: Positive for: None Negative for: Vision change, hearing change, syncope, dizziness, nausea, vomiting,diarrhea, bloody stool, stomach pain, cough, congestion, diaphoresis, urinary frequency, urinary pain,skin lesions, skin rashes Others previously listed  Objective: Telemetry: Sinus rhythm with bundle branch block Physical Exam: Blood pressure (!) 152/84, pulse 84, temperature (!) 97.5 F (36.4 C), temperature source Oral, resp. rate 18, height 6' (1.829 m), weight 73.2 kg, SpO2 97 %. Body mass index is 21.88 kg/m. General: Well developed, well nourished, in no acute distress. Head: Normocephalic, atraumatic, sclera non-icteric, no xanthomas, nares are without discharge. Neck: No apparent masses Lungs: Normal respirations with no wheezes, no rhonchi, no rales , no crackles   Heart: Regular rate and rhythm, normal S1 S2, no murmur, no rub, no gallop, PMI is normal size and placement, carotid upstroke normal without bruit, jugular venous pressure normal Abdomen: Soft, non-tender, non-distended with  normoactive bowel sounds. No hepatosplenomegaly. Abdominal aorta is normal size without bruit Extremities: No edema, no clubbing, no cyanosis, no ulcers,  Peripheral: 2+ radial, 2+ femoral, 2+ dorsal pedal pulses Neuro: Alert and oriented. Moves all extremities spontaneously. Psych:  Responds to questions appropriately with a normal affect.   Intake/Output Summary (Last 24 hours) at 08/26/2018 0900 Last data filed at 08/26/2018 1610 Gross per 24 hour  Intake 840 ml  Output 850 ml  Net -10 ml    Inpatient Medications:  . amLODipine  5 mg Oral Daily  . aspirin  81 mg Oral Daily  . atorvastatin  20 mg Oral Daily  . carvedilol  6.25 mg Oral BID WC  . cholecalciferol  1,000 Units Oral Daily  . clopidogrel  75 mg Oral Q breakfast  . docusate sodium  100 mg Oral BID  . ezetimibe  10 mg Oral QHS  . furosemide  20 mg Oral Daily  . heparin  5,000 Units Subcutaneous Q8H  . insulin aspart  0-5 Units Subcutaneous QHS  . insulin aspart  0-9 Units Subcutaneous TID WC  . isosorbide mononitrate  30 mg Oral Daily  . loratadine  10 mg Oral Daily  . multivitamin with minerals  1 tablet Oral Daily  . multivitamin-lutein  1 capsule Oral Daily  . sodium chloride flush  3 mL Intravenous Q12H   Infusions:  . sodium chloride      Labs: Recent Labs    08/25/18 0516 08/26/18 0425  NA 142 141  K 3.2* 3.9  CL 105 105  CO2 27 26  GLUCOSE 121* 135*  BUN 23 21  CREATININE 1.27* 1.18  CALCIUM 8.7* 8.9  MG 2.0  --    No results for input(s): AST, ALT, ALKPHOS, BILITOT, PROT, ALBUMIN in the last 72 hours. Recent Labs    08/25/18 [phone]  08/26/18 0425  WBC 6.6 7.5  HGB 11.5* 11.8*  HCT 34.9* 36.2*  MCV 91.8 91.9  PLT 220 224   Recent Labs    08/24/18 0642 08/24/18 1152 08/24/18 1940 08/25/18 0516  TROPONINI 3.26* 13.50* 22.55* 16.20*   Invalid input(s): POCBNP No results for input(s): HGBA1C in the last 72 hours.   Weights: Filed Weights   08/24/18 0231 08/25/18 0500 08/26/18 0442   Weight: 80.3 kg 80.3 kg 73.2 kg     Radiology/Studies:  Dg Chest Port 1 View  Result Date: 08/24/2018 CLINICAL DATA:  77 year old male with dyspnea. EXAM: PORTABLE CHEST 1 VIEW COMPARISON:  Chest CT dated 05/19/2016 FINDINGS: There is mild cardiomegaly with vascular congestion and interstitial edema. Superimposed pneumonia is not excluded. Clinical correlation is recommended. No focal consolidation, pleural effusion, or pneumothorax. Median sternotomy wires and CABG vascular clips noted. Atherosclerotic calcification of the aortic arch. No acute osseous pathology. IMPRESSION: Cardiomegaly with vascular congestion and interstitial edema. Superimposed pneumonia is not excluded. Electronically Signed   By: Elgie CollardArash  Radparvar M.D.   On: 08/24/2018 02:45     Assessment and Recommendation  77 y.o. male with known cardiovascular disease status post coronary bypass graft in the remote past with acute non-ST elevation myocardial infarction and acute on chronic systolic dysfunction congestive heart failure with cardiac catheterization showing new culprit lesion of distal saphenous vein graft to obtuse marginal 1  1.  No further cardiac diagnostics testing and her treatment at this time 2.  Dual antiplatelet therapy 3.  Beta-blocker ACE inhibitor for cardial infarction LV systolic dysfunction as able 4.  Cardiac rehabilitation 5.  Ambulate and follow for improvements of symptoms and/or further concerns of advanced heart block but currently without symptoms with second-degree type I AV block which can be monitored at this time 6.  Ambulating well without symptoms on dual antiplatelet therapy possible discharge to home with follow-up next week to place monitor to assess for the possibility of further advanced heart block requiring further intervention and or treatment options Signed, Arnoldo HookerBruce Kengo Sturges M.D. FACC

## 2018-08-26 NOTE — Discharge Instructions (Signed)
Shortness of Breath, Adult Shortness of breath is when a person has trouble breathing enough air, or when a person feels like she or he is having trouble breathing in enough air. Shortness of breath could be a sign of medical problem. Follow these instructions at home: Pay attention to any changes in your symptoms. Take these actions to help with your condition:  Do not smoke. Smoking is a common cause of shortness of breath. If you smoke and you need help quitting, ask your health care provider.  Avoid things that can irritate your airways, such as: ? Mold. ? Dust. ? Air pollution. ? Chemical fumes. ? Things that can cause allergy symptoms (allergens), if you have allergies.  Keep your living space clean and free of mold and dust.  Rest as needed. Slowly return to your usual activities.  Take over-the-counter and prescription medicines, including oxygen and inhaled medicines, only as told by your health care provider.  Keep all follow-up visits as told by your health care provider. This is important.  Contact a health care provider if:  Your condition does not improve as soon as expected.  You have a hard time doing your normal activities, even after you rest.  You have new symptoms. Get help right away if:  Your shortness of breath gets worse.  You have shortness of breath when you are resting.  You feel light-headed or you faint.  You have a cough that is not controlled with medicines.  You cough up blood.  You have pain with breathing.  You have pain in your chest, arms, shoulders, or abdomen.  You have a fever.  You cannot walk up stairs or exercise the way that you normally do. This information is not intended to replace advice given to you by your health care provider. Make sure you discuss any questions you have with your health care provider. Document Released: 06/03/2001 Document Revised: 03/29/2016 Document Reviewed: 02/14/2016 Elsevier Interactive Patient  Education  2018 Elsevier Inc.  

## 2018-08-26 NOTE — Discharge Summary (Addendum)
[facility] [place] at [facility]   PATIENT NAME: Reginald Franklin    MR#:  [id number]  DATE OF BIRTH:  March 30, 1941  DATE OF ADMISSION:  08/24/2018   ADMITTING PHYSICIAN: Arnaldo Natal, MD  DATE OF DISCHARGE:  08/26/18  PRIMARY CARE PHYSICIAN: Marguarite Arbour, MD   ADMISSION DIAGNOSIS:   Acute on chronic systolic CHF (congestive heart failure) (HCC) [I50.23]  DISCHARGE DIAGNOSIS:   Active Problems:   Acute systolic CHF (congestive heart failure) (HCC)   Acute on chronic systolic CHF (congestive heart failure) (HCC)   Non-ST elevation (NSTEMI) myocardial infarction (HCC)   SECONDARY DIAGNOSIS:   Past Medical History:  Diagnosis Date  . Anginal pain (HCC)   . BPH (benign prostatic hyperplasia)   . Coronary artery disease   . Diabetes mellitus without complication [facility])    Patient takes Metformin.  . Erosive gastropathy   . Gastritis   . Hemorrhoids   . Hiatal hernia   . Hyperlipidemia   . Hypertension   . Nasal polyp   . Peripheral vascular disease (HCC)   . S/P CABG x 3   . Seasonal allergies   . Stroke [facility])     HOSPITAL COURSE:   77 year old male with past medical history significant for CAD status post CABG, hypertension, diabetes and hyperlipidemia presents to hospital secondary to worsening shortness of breath  1.  NSTEMI- s./p cardiac cath and had PCI to obtuse marginal 1. -Cath showing akinesis of inferior and inferior apical myocardium and EF down to 35% -Appreciate cardiology consult. received heparin drip - Echocardiogram is done -On aspirin, Plavix, statin and Imdur -Coreg restarted, asymptomatic with nodal AV block and bradycardia -Cardiology recommended outpatient Holter monitor and may be pacemaker if needed.  Continue low-dose Coreg at discharge  2.  Acute on chronic systolic heart failure-last known EF from September 2019 as outpatient of 35%, has ischemic cardiomyopathy. -On oral Lasix now.  On Coreg and losartan  has been added at discharge.  Can be changed to [facility] as outpatient  3.  Hypertension-on , Imdur, losartan, Coreg and Norvasc  4.  Diabetes-on metformin   Ambulatory at baseline Updated family at bedside-discharged today  DISCHARGE CONDITIONS:   Guarded  CONSULTS OBTAINED:   Treatment Team:  Lamar Blinks, MD  DRUG ALLERGIES:   Allergies  Allergen Reactions  . Codeine Nausea And Vomiting   DISCHARGE MEDICATIONS:   Allergies as of 08/26/2018      Reactions   Codeine Nausea And Vomiting      Medication List    STOP taking these medications   aspirin 325 MG EC tablet Replaced by:  aspirin 81 MG chewable tablet   meloxicam 7.5 MG tablet Commonly known as:  MOBIC     TAKE these medications   amLODipine 5 MG tablet Commonly known as:  NORVASC Take 5 mg by mouth daily.   aspirin 81 MG chewable tablet Chew 1 tablet (81 mg total) by mouth daily. Start taking on:  08/27/2018 Replaces:  aspirin 325 MG EC tablet   atorvastatin 80 MG tablet Commonly known as:  LIPITOR Take 1 tablet (80 mg total) by mouth daily. What changed:    medication strength  how much to take   carvedilol 6.25 MG tablet Commonly known as:  COREG Take 6.25 mg by mouth 2 (two) times daily with a meal.   cetirizine 10 MG tablet Commonly known as:  ZYRTEC Take 10 mg by mouth at bedtime.   Cholecalciferol 25  MCG (1000 UT) tablet Take 1,000 Units by mouth daily.   clopidogrel 75 MG tablet Commonly known as:  PLAVIX Take 1 tablet (75 mg total) by mouth daily with breakfast.   ezetimibe 10 MG tablet Commonly known as:  ZETIA Take 10 mg by mouth at bedtime.   fluticasone 50 MCG/ACT nasal spray Commonly known as:  FLONASE Place 2 sprays into both nostrils daily as needed for rhinitis.   furosemide 20 MG tablet Commonly known as:  LASIX Take 1 tablet (20 mg total) by mouth daily. Start taking on:  08/27/2018 What changed:    medication strength  how much to take  when  to take this  reasons to take this   isosorbide mononitrate 30 MG 24 hr tablet Commonly known as:  IMDUR Take 30 mg by mouth daily.   losartan 50 MG tablet Commonly known as:  COZAAR Take 1 tablet (50 mg total) by mouth daily.   Lutein 20 MG Tabs Take 1 tablet by mouth daily.   metFORMIN 1000 MG tablet Commonly known as:  GLUCOPHAGE Take 1,000 mg by mouth 2 (two) times daily.   multivitamin tablet Take 1 tablet by mouth daily.   VISION FORMULA EYE HEALTH PO Take 1 tablet by mouth daily.        DISCHARGE INSTRUCTIONS:   1.  PCP follow-up in 1 to 2 weeks 2.  Cardiac rehab 3.  Cardiology follow-up in 2 weeks  DIET:   Cardiac diet  ACTIVITY:   Activity as tolerated  OXYGEN:   Home Oxygen: No.  Oxygen Delivery: room air  DISCHARGE LOCATION:   home   If you experience worsening of your admission symptoms, develop shortness of breath, life threatening emergency, suicidal or homicidal thoughts you must seek medical attention immediately by calling 911 or calling your MD immediately  if symptoms less severe.  You Must read complete instructions/literature along with all the possible adverse reactions/side effects for all the Medicines you take and that have been prescribed to you. Take any new Medicines after you have completely understood and accpet all the possible adverse reactions/side effects.   Please note  You were cared for by a hospitalist during your hospital stay. If you have any questions about your discharge medications or the care you received while you were in the hospital after you are discharged, you can call the unit and asked to speak with the hospitalist on call if the hospitalist that took care of you is not available. Once you are discharged, your primary care physician will handle any further medical issues. Please note that NO REFILLS for any discharge medications will be authorized once you are discharged, as it is imperative that you return  to your primary care physician (or establish a relationship with a primary care physician if you do not have one) for your aftercare needs so that they can reassess your need for medications and monitor your lab values.    On the day of Discharge:  VITAL SIGNS:   Blood pressure (!) 152/84, pulse 84, temperature (!) 97.5 F (36.4 C), temperature source Oral, resp. rate 18, height 6' (1.829 m), weight 73.2 kg, SpO2 97 %.  PHYSICAL EXAMINATION:    GENERAL:  77 y.o.-year-old patient lying in the bed with no acute distress.  EYES: Pupils equal, round, reactive to light and accommodation. No scleral icterus. Extraocular muscles intact.  HEENT: Head atraumatic, normocephalic. Oropharynx and nasopharynx clear.  NECK:  Supple, no jugular venous distention. No thyroid enlargement, no  tenderness.  LUNGS: Normal breath sounds bilaterally, no wheezing, rales,rhonchi or crepitation. No use of accessory muscles of respiration.  Decreased bibasilar breath sounds. CARDIOVASCULAR: S1, S2 normal. No  rubs, or gallops.  2/6 systolic murmur is present ABDOMEN: Soft, nontender, nondistended. Bowel sounds present. No organomegaly or mass.  EXTREMITIES: No pedal edema, cyanosis, or clubbing.  NEUROLOGIC: Cranial nerves II through XII are intact. Muscle strength 5/5 in all extremities. Sensation intact. Gait not checked.  PSYCHIATRIC: The patient is alert and oriented x 3.  SKIN: No obvious rash, lesion, or ulcer. Marland Kitchen   DATA REVIEW:   CBC Recent Labs  Lab 08/26/18 0425  WBC 7.5  HGB 11.8*  HCT 36.2*  PLT 224    Chemistries  Recent Labs  Lab 08/25/18 0516 08/26/18 0425  NA 142 141  K 3.2* 3.9  CL 105 105  CO2 27 26  GLUCOSE 121* 135*  BUN 23 21  CREATININE 1.27* 1.18  CALCIUM 8.7* 8.9  MG 2.0  --      Microbiology Results  Results for orders placed or performed during the hospital encounter of 08/24/18  MRSA PCR Screening     Status: None   Collection Time: 08/24/18  6:02 AM  Result  Value Ref Range Status   MRSA by PCR NEGATIVE NEGATIVE Final    Comment:        The GeneXpert MRSA Assay (FDA approved for NASAL specimens only), is one component of a comprehensive MRSA colonization surveillance program. It is not intended to diagnose MRSA infection nor to guide or monitor treatment for MRSA infections. Performed at [facility], [address], [place], [place] [address]     RADIOLOGY:  No results found.   Management plans discussed with the patient, family and they are in agreement.  CODE STATUS:     Code Status Orders  (From admission, onward)         Start     Ordered   08/24/18 0558  Full code  Continuous     08/24/18 0557        Code Status History    Date Active Date Inactive Code Status Order ID Comments User Context   06/10/2016 1003 06/11/2016 1301 Full Code [id number]  Marcina Millard, MD Inpatient   06/10/2016 1003 06/10/2016 1003 Full Code [id number]  Dalia Heading, MD Inpatient   06/10/2016 1002 06/10/2016 1003 Full Code [id number]  Marcina Millard, MD Inpatient      TOTAL TIME TAKING CARE OF THIS PATIENT: 38 minutes.    Enid Baas M.D on 08/26/2018 at 10:12 AM  Between 7am to 6pm - Pager - [phone]  After 6pm go to [email or web address] - Social research officer, government  [facility]  Office  [phone]  CC: Primary care physician; Marguarite Arbour, MD   Note: This dictation was prepared with Dragon dictation along with smaller phrase technology. Any transcriptional errors that result from this process are unintentional.

## 2018-08-26 NOTE — Progress Notes (Signed)
PHARMACY - CRITICAL CARE PROGRESS NOTE  Pharmacy Consult for electrolyte management Indication: hypokalemia   Allergies  Allergen Reactions  . Codeine Nausea And Vomiting    Patient Measurements: Height: 6' (182.9 cm) Weight: 161 lb 4.8 oz (73.2 kg) IBW/kg (Calculated) : 77.6 Adjusted Body Weight:    Vital Signs: Temp: 98.5 F (36.9 C) (12/05 0442) Temp Source: Oral (12/05 0442) BP: 142/85 (12/05 0442) Pulse Rate: 86 (12/05 0442) Intake/Output from previous day: 12/04 0701 - 12/05 0700 In: 840 [P.O.:840] Out: 875 [Urine:875] Intake/Output from this shift: No intake/output data recorded. Vent settings for last 24 hours:    Labs: BMP Latest Ref Rng & Units 08/26/2018 08/25/2018 08/24/2018  Glucose 70 - 99 mg/dL 161(W135(H) 960(A121(H) 540(J217(H)  BUN 8 - 23 mg/dL 21 23 81(X28(H)  Creatinine 0.61 - 1.24 mg/dL 9.141.18 7.82(N1.27(H) 5.62(Z1.36(H)  Sodium 135 - 145 mmol/L 141 142 139  Potassium 3.5 - 5.1 mmol/L 3.9 3.2(L) 4.2  Chloride 98 - 111 mmol/L 105 105 102  CO2 22 - 32 mmol/L 26 27 22   Calcium 8.9 - 10.3 mg/dL 8.9 3.0(Q8.7(L) 9.5    Estimated Creatinine Clearance: 54.3 mL/min (by C-G formula based on SCr of 1.18 mg/dL).  Assessment: Patient is a 77yo male admitted for SOB and chest pain, found to have STEMI and was taken to Cath lab. Patient is s/p PCI and stent placement. Noted to hypokalemic this morning with K of 3.2.  Goal of Therapy:  K ~ 4.0, Mag ~ 2.0  Plan:  K 3.9. Will order one dose of Klor-Con 40 mEq po and check electrolytes tomorrow with AM labs.   Ruari Duggan A. Dahlia Bailiffookson, PharmD, BCPS 08/26/2018 7:38 AM

## 2018-09-02 ENCOUNTER — Ambulatory Visit: Payer: Medicare Other

## 2018-09-03 NOTE — ED Provider Notes (Signed)
[facility] Emergency Department Provider Note   First MD Initiated Contact with Patient 08/24/18 0425     (approximate)  I have reviewed the triage vital signs and the nursing notes.   HISTORY  Chief Complaint Shortness of Breath   HPI Reginald Franklin is a 77 y.o. male with below list of chronic medical conditions including CAD CABG and CHF presents to the emergency department via EMS with chest pain, diaphoresis and shortness of breath.  Patient presents emergency department possibly of a code STEMI.  Patient admits to dyspnea and cough over the past 2 weeks.  Patient denies any fever.  Patient describes chest discomfort as as if someone is sitting on his chest.  Patient denies any lower extremity pain or swelling.   Past Medical History:  Diagnosis Date  . Anginal pain (HCC)   . BPH (benign prostatic hyperplasia)   . Coronary artery disease   . Diabetes mellitus without complication [facility])    Patient takes Metformin.  . Erosive gastropathy   . Gastritis   . Hemorrhoids   . Hiatal hernia   . Hyperlipidemia   . Hypertension   . Nasal polyp   . Peripheral vascular disease (HCC)   . S/P CABG x 3   . Seasonal allergies   . Stroke [facility])     Patient Active Problem List   Diagnosis Date Noted  . Acute systolic CHF (congestive heart failure) (HCC) 08/24/2018  . Acute on chronic systolic CHF (congestive heart failure) (HCC) 08/24/2018  . Non-ST elevation (NSTEMI) myocardial infarction (HCC)   . Benign prostatic hyperplasia 07/03/2016  . Environmental allergies 07/03/2016  . Angina, class I (HCC) 07/03/2016  . History of nasal polyp 07/03/2016  . Hyperlipidemia, unspecified 07/03/2016  . Hypertension 07/03/2016  . PVD (peripheral vascular disease) (HCC) 07/03/2016  . Coronary artery disease 06/10/2016  . CAD (coronary artery disease) 06/10/2016  . Unstable angina (HCC) 06/10/2016  . 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 Anemia  04/25/2014  . Diabetes (HCC) 04/25/2014    Past Surgical History:  Procedure Laterality Date  . CARDIAC CATHETERIZATION N/A 06/10/2016   Procedure: Coronary/Graft Angiography;  Surgeon: Dalia Heading, MD;  Location: [facility] INVASIVE CV LAB;  Service: Cardiovascular;  Laterality: N/A;  . CARDIAC CATHETERIZATION N/A 06/10/2016   Procedure: Coronary Stent Intervention;  Surgeon: Marcina Millard, MD;  Location: [facility] INVASIVE CV LAB;  Service: Cardiovascular;  Laterality: N/A;  . CAROTID ENDARTERECTOMY    . CORONARY ARTERY BYPASS GRAFT    . CORONARY STENT INTERVENTION N/A 08/24/2018   Procedure: CORONARY STENT INTERVENTION;  Surgeon: Yvonne Kendall, MD;  Location: [facility] INVASIVE CV LAB;  Service: Cardiovascular;  Laterality: N/A;  . HERNIA REPAIR    . LEFT HEART CATH AND CORS/GRAFTS ANGIOGRAPHY N/A 08/24/2018   Procedure: LEFT HEART CATH AND CORS/GRAFTS ANGIOGRAPHY;  Surgeon: Lamar Blinks, MD;  Location: [facility] INVASIVE CV LAB;  Service: Cardiovascular;  Laterality: N/A;  . TONSILLECTOMY      Prior to Admission medications   Medication Sig Start Date End Date Taking? Authorizing Provider  amLODipine (NORVASC) 5 MG tablet Take 5 mg by mouth daily. 07/31/18  Yes [provider]  carvedilol (COREG) 6.25 MG tablet Take 6.25 mg by mouth 2 (two) times daily with a meal.   Yes [provider]  cetirizine (ZYRTEC) 10 MG tablet Take 10 mg by mouth at bedtime.   Yes [provider]  Cholecalciferol 25 MCG (1000 UT) tablet Take 1,000 Units by  mouth daily.    Yes [provider]  clopidogrel (PLAVIX) 75 MG tablet Take 1 tablet (75 mg total) by mouth daily with breakfast. 06/11/16  Yes Fath, Darlin PriestlyKenneth A, MD  ezetimibe (ZETIA) 10 MG tablet Take 10 mg by mouth at bedtime.   Yes [provider]  fluticasone (FLONASE) 50 MCG/ACT nasal spray Place 2 sprays into both nostrils daily as needed for rhinitis.    Yes [provider]  isosorbide mononitrate (IMDUR)  30 MG 24 hr tablet Take 30 mg by mouth daily. 08/10/18  Yes [provider]  Lutein 20 MG TABS Take 1 tablet by mouth daily.    Yes [provider]  metFORMIN (GLUCOPHAGE) 1000 MG tablet Take 1,000 mg by mouth 2 (two) times daily. 08/16/18  Yes [provider]  Multiple Vitamin (MULTIVITAMIN) tablet Take 1 tablet by mouth daily.   Yes [provider]  Multiple Vitamins-Minerals (VISION FORMULA EYE HEALTH PO) Take 1 tablet by mouth daily.   Yes [provider]  aspirin 81 MG chewable tablet Chew 1 tablet (81 mg total) by mouth daily. 08/27/18   Enid BaasKalisetti, Radhika, MD  atorvastatin (LIPITOR) 80 MG tablet Take 1 tablet (80 mg total) by mouth daily. 08/26/18   Enid BaasKalisetti, Radhika, MD  furosemide (LASIX) 20 MG tablet Take 1 tablet (20 mg total) by mouth daily. 08/27/18   Enid BaasKalisetti, Radhika, MD  losartan (COZAAR) 50 MG tablet Take 1 tablet (50 mg total) by mouth daily. 08/26/18 08/26/19  Enid BaasKalisetti, Radhika, MD    Allergies Codeine  No family history on file.  Social History Social History   Tobacco Use  . Smoking status: Never Smoker  . Smokeless tobacco: Never Used  Substance Use Topics  . Alcohol use: No  . Drug use: No    Review of Systems Constitutional: No fever/chills Eyes: No visual changes. ENT: No sore throat. Cardiovascular: Positive for chest pain. Respiratory: Positive for shortness of breath. Gastrointestinal: No abdominal pain.  No nausea, no vomiting.  No diarrhea.  No constipation. Genitourinary: Negative for dysuria. Musculoskeletal: Negative for neck pain.  Negative for back pain. Integumentary: Negative for rash.  Positive for diaphoresis Neurological: Negative for headaches, focal weakness or numbness.  ____________________________________________   PHYSICAL EXAM:  VITAL SIGNS: ED Triage Vitals  Enc Vitals Group     BP 08/24/18 0216 (!) 134/93     Pulse Rate 08/24/18 0219 86     Resp 08/24/18 0216 (!) 36     Temp  08/24/18 0600 (!) 96.8 F (36 C)     Temp Source 08/24/18 0600 Oral     SpO2 08/24/18 0219 (!) 85 %     Weight 08/24/18 0216 81.6 kg (180 lb)     Height 08/24/18 0216 1.829 m (6')     Head Circumference --      Peak Flow --      Pain Score 08/24/18 0216 0     Pain Loc --      Pain Edu? --      Excl. in GC? --     Constitutional: Alert and oriented. Well appearing and in no acute distress. Eyes: Conjunctivae are normal. PERRL. EOMI. Mouth/Throat: Mucous membranes are moist. Oropharynx non-erythematous. Neck: No stridor.  Cardiovascular: Normal rate, regular rhythm. Good peripheral circulation. Grossly normal heart sounds. Respiratory: Normal respiratory effort.  No retractions. Lungs CTAB. Gastrointestinal: Soft and nontender. No distention.  Musculoskeletal: No lower extremity tenderness nor edema. No gross deformities of extremities. Neurologic:  Normal speech and  language. No gross focal neurologic deficits are appreciated.  Skin:  Skin is warm, dry and intact. No rash noted. Psychiatric: Mood and affect are normal. Speech and behavior are normal.  ____________________________________________   LABS (all labs ordered are listed, but only abnormal results are displayed)  Labs Reviewed  BASIC METABOLIC PANEL - Abnormal; Notable for the following components:      Result Value   Glucose, Bld 217 (*)    BUN 28 (*)    Creatinine, Ser 1.36 (*)    GFR calc non Af Amer 50 (*)    GFR calc Af Amer 58 (*)    All other components within normal limits  CBC - Abnormal; Notable for the following components:   WBC 14.1 (*)    RDW 16.9 (*)    All other components within normal limits  TROPONIN I - Abnormal; Notable for the following components:   Troponin I 0.20 (*)    All other components within normal limits  BRAIN NATRIURETIC PEPTIDE - Abnormal; Notable for the following components:   B Natriuretic Peptide 490.0 (*)    All other components within normal limits  CBC - Abnormal;  Notable for the following components:   WBC 11.6 (*)    RDW 16.9 (*)    All other components within normal limits  APTT - Abnormal; Notable for the following components:   aPTT 112 (*)    All other components within normal limits  TROPONIN I - Abnormal; Notable for the following components:   Troponin I 3.26 (*)    All other components within normal limits  TROPONIN I - Abnormal; Notable for the following components:   Troponin I 13.50 (*)    All other components within normal limits  TROPONIN I - Abnormal; Notable for the following components:   Troponin I 22.55 (*)    All other components within normal limits  GLUCOSE, CAPILLARY - Abnormal; Notable for the following components:   Glucose-Capillary 150 (*)    All other components within normal limits  GLUCOSE, CAPILLARY - Abnormal; Notable for the following components:   Glucose-Capillary 126 (*)    All other components within normal limits  GLUCOSE, CAPILLARY - Abnormal; Notable for the following components:   Glucose-Capillary 131 (*)    All other components within normal limits  GLUCOSE, CAPILLARY - Abnormal; Notable for the following components:   Glucose-Capillary 115 (*)    All other components within normal limits  CBC - Abnormal; Notable for the following components:   RBC 3.80 (*)    Hemoglobin 11.5 (*)    HCT 34.9 (*)    RDW 16.9 (*)    All other components within normal limits  GLUCOSE, CAPILLARY - Abnormal; Notable for the following components:   Glucose-Capillary 175 (*)    All other components within normal limits  TROPONIN I - Abnormal; Notable for the following components:   Troponin I 16.20 (*)    All other components within normal limits  BASIC METABOLIC PANEL - Abnormal; Notable for the following components:   Potassium 3.2 (*)    Glucose, Bld 121 (*)    Creatinine, Ser 1.27 (*)    Calcium 8.7 (*)    GFR calc non Af Amer 54 (*)    All other components within normal limits  GLUCOSE, CAPILLARY -  Abnormal; Notable for the following components:   Glucose-Capillary 134 (*)    All other components within normal limits  GLUCOSE, CAPILLARY - Abnormal; Notable for the following components:  Glucose-Capillary 116 (*)    All other components within normal limits  GLUCOSE, CAPILLARY - Abnormal; Notable for the following components:   Glucose-Capillary 191 (*)    All other components within normal limits  CBC - Abnormal; Notable for the following components:   RBC 3.94 (*)    Hemoglobin 11.8 (*)    HCT 36.2 (*)    RDW 16.6 (*)    All other components within normal limits  BASIC METABOLIC PANEL - Abnormal; Notable for the following components:   Glucose, Bld 135 (*)    GFR calc non Af Amer 59 (*)    All other components within normal limits  GLUCOSE, CAPILLARY - Abnormal; Notable for the following components:   Glucose-Capillary 112 (*)    All other components within normal limits  GLUCOSE, CAPILLARY - Abnormal; Notable for the following components:   Glucose-Capillary 125 (*)    All other components within normal limits  MRSA PCR SCREENING  HEPARIN LEVEL (UNFRACTIONATED)  PROTIME-INR  TSH  MAGNESIUM  POCT ACTIVATED CLOTTING TIME   ____________________________________________  EKG  ED ECG REPORT I, [place] Ayad Nieman, the attending physician, personally viewed and interpreted this ECG.   Date: 08/25/2018  EKG Time: 4:50 AM  Rate: 72  Rhythm: Sinus rhythm with left bundle branch block  Axis: Leftward axis  Intervals: Normal  ST&T Change: None  ____________________________________________  RADIOLOGY I, [place] Gear[place] Spilman, personally viewed and evaluated these images (plain radiographs) as part of my medical decision making, as well as reviewing the written report by the radiologist.  ED MD interpretation: Cardiomegaly with vascular congestion and interstitial edema superimposed pneumonia not excluded per radiologist.  Study Result   CLINICAL DATA:  77 year old male  with dyspnea.  EXAM: PORTABLE CHEST 1 VIEW  COMPARISON:  Chest CT dated 05/19/2016  FINDINGS: There is mild cardiomegaly with vascular congestion and interstitial edema. Superimposed pneumonia is not excluded. Clinical correlation is recommended. No focal consolidation, pleural effusion, or pneumothorax. Median sternotomy wires and CABG vascular clips noted. Atherosclerotic calcification of the aortic arch. No acute osseous pathology.  IMPRESSION: Cardiomegaly with vascular congestion and interstitial edema. Superimposed pneumonia is not excluded.   Electronically Signed   By: Elgie Collard M.D.   On: 08/24/2018 02:45      Critical Care performed:  .Critical Care Performed by: Darci Current, MD Authorized by: Darci Current, MD   Critical care provider statement:    Critical care time (minutes):  45   Critical care time was exclusive of:  Separately billable procedures and treating other patients   Critical care was necessary to treat or prevent imminent or life-threatening deterioration of the following conditions:  Circulatory failure   Critical care was time spent personally by me on the following activities:  Development of treatment plan with patient or surrogate, discussions with consultants, evaluation of patient's response to treatment, examination of patient, obtaining history from patient or surrogate, ordering and performing treatments and interventions, ordering and review of laboratory studies, ordering and review of radiographic studies, pulse oximetry, re-evaluation of patient's condition and review of old charts   I assumed direction of critical care for this patient from another provider in my specialty: no       ____________________________________________   INITIAL IMPRESSION / ASSESSMENT AND PLAN / ED COURSE  As part of my medical decision making, I reviewed the following data within the electronic MEDICAL RECORD NUMBER  77 year old  male presenting with above-stated history and physical exam secondary to chest  pain and dyspnea.  Concern for possible NSTEMI CHF exacerbation.  Patient discussed with Dr. Kennith Maes cardiologist who agreed that patient did not meet criteria for STEMI at this time.  BiPAP was applied to the patient shortly after arrival heparin was initiated as per Dr. Kirke Corin recommendation as well as Lasix 40 mg.  Patient discussed with Dr. Sheryle Hail for hospital admission further evaluation and management ____________________________________________  FINAL CLINICAL IMPRESSION(S) / ED DIAGNOSES  CHF exacerbation NSTEMI   MEDICATIONS GIVEN DURING THIS VISIT:  Medications  0.9 %  sodium chloride infusion ( Intravenous Stopped 08/25/18 0000)  ipratropium-albuterol (DUONEB) 0.5-2.5 (3) MG/3ML nebulizer solution (3 mLs  Given 08/24/18 0221)  furosemide (LASIX) injection 40 mg (40 mg Intravenous Given 08/24/18 0221)  heparin bolus via infusion 4,000 Units (4,000 Units Intravenous Bolus from Bag 08/24/18 0344)  furosemide (LASIX) injection 40 mg (40 mg Intravenous Given 08/24/18 0814)  potassium chloride SA (K-DUR,KLOR-CON) CR tablet 40 mEq (40 mEq Oral Given 08/25/18 1619)  potassium chloride SA (K-DUR,KLOR-CON) CR tablet 40 mEq (40 mEq Oral Given 08/26/18 0842)     ED Discharge Orders         Ordered    furosemide (LASIX) 20 MG tablet  Daily     08/26/18 1011    aspirin 81 MG chewable tablet  Daily     08/26/18 1011    Diet - low sodium heart healthy     08/26/18 1011    Activity as tolerated - No restrictions     08/26/18 1011    losartan (COZAAR) 50 MG tablet  Daily     08/26/18 1011    atorvastatin (LIPITOR) 40 MG tablet  Daily,   Status:  Discontinued     08/26/18 1127    atorvastatin (LIPITOR) 80 MG tablet  Daily     08/26/18 1128    AMB Referral to Cardiac Rehabilitation - Phase II     08/24/18 1833           Note:  This document was prepared using Dragon voice recognition software and may include  unintentional dictation errors.    Darci Current, MD 09/06/18 [id number]

## 2018-09-05 NOTE — Progress Notes (Signed)
Patient ID: Reginald Franklin, male    DOB: [birth year], 77 y.o.   MRN: [id number]  HPI  Reginald Franklin is a 77 y/o male with a history of CAD, DM, hyperlipidemia, HTN, stroke, PVD and chronic heart failure.   Echo report from 08/25/18 reviewed and showed an EF of 30-35% along with trivial AR and moderate Reginald.   Cardiac catheterization done 08/24/18 that showed an EF of 35% along with: severe 3 vessel coronary artery disease with occlusion of all native vessels including left main right coronary artery left anterior descending artery and left circumflex artery Patent LIMA to the LAD Previous occlusion of graft to right coronary artery Patent stent to saphenous vein graft to obtuse marginal 1 but further distal stenosis of saphenous vein graft to obtuse marginal 1 of 95%  Had successful PCI of distal SVG-OM anastomosis using Resolute Onyx 2.0 x 15 mm DES (post-dilated proximally to 3.2 mm) with 0% residual stenosis and TIMI-3 flow.  Admitted 08/24/18 due to NSTEMI and acute on chronic HF. Cardiology consult obtained. Catheterization and PCI done and he was discharged after 2 days.   He presents today for his initial visit with a chief complaint of minimal fatigue upon moderate exertion. He describes this as chronic in nature having been present for several months. He has associated head congestion, cough and chronic difficulty sleeping along with this. He denies any abdominal distention, palpitations, pedal edema, chest pain, shortness of breath, dizziness or weight gain.   Past Medical History:  Diagnosis Date  . Anginal pain (HCC)   . BPH (benign prostatic hyperplasia)   . CHF (congestive heart failure) (HCC)   . Coronary artery disease   . Diabetes mellitus without complication [facility])    Patient takes Metformin.  . Erosive gastropathy   . Gastritis   . Hemorrhoids   . Hiatal hernia   . Hyperlipidemia   . Hypertension   . Nasal polyp   . Peripheral vascular disease (HCC)   . S/P CABG x 3    . Seasonal allergies   . Stroke [facility])    Past Surgical History:  Procedure Laterality Date  . CARDIAC CATHETERIZATION N/A 06/10/2016   Procedure: Coronary/Graft Angiography;  Surgeon: Dalia Heading, MD;  Location: [facility] INVASIVE CV LAB;  Service: Cardiovascular;  Laterality: N/A;  . CARDIAC CATHETERIZATION N/A 06/10/2016   Procedure: Coronary Stent Intervention;  Surgeon: Marcina Millard, MD;  Location: [facility] INVASIVE CV LAB;  Service: Cardiovascular;  Laterality: N/A;  . CAROTID ENDARTERECTOMY    . CORONARY ARTERY BYPASS GRAFT    . CORONARY STENT INTERVENTION N/A 08/24/2018   Procedure: CORONARY STENT INTERVENTION;  Surgeon: Yvonne Kendall, MD;  Location: [facility] INVASIVE CV LAB;  Service: Cardiovascular;  Laterality: N/A;  . HERNIA REPAIR    . LEFT HEART CATH AND CORS/GRAFTS ANGIOGRAPHY N/A 08/24/2018   Procedure: LEFT HEART CATH AND CORS/GRAFTS ANGIOGRAPHY;  Surgeon: Lamar Blinks, MD;  Location: [facility] INVASIVE CV LAB;  Service: Cardiovascular;  Laterality: N/A;  . TONSILLECTOMY     History reviewed. No pertinent family history. Social History   Tobacco Use  . Smoking status: Never Smoker  . Smokeless tobacco: Never Used  Substance Use Topics  . Alcohol use: No   Allergies  Allergen Reactions  . Codeine Nausea And Vomiting   Prior to Admission medications   Medication Sig Start Date End Date Taking? Authorizing Provider  amLODipine (NORVASC) 5 MG tablet Take 5 mg by mouth daily. 07/31/18  Yes [provider]  aspirin 81 MG chewable tablet Chew 1 tablet (81 mg total) by mouth daily. 08/27/18  Yes Enid Baas, MD  atorvastatin (LIPITOR) 80 MG tablet Take 1 tablet (80 mg total) by mouth daily. Patient taking differently: Take 20 mg by mouth daily.  08/26/18  Yes Enid Baas, MD  carvedilol (COREG) 6.25 MG tablet Take 6.25 mg by mouth 2 (two) times daily with a meal.   Yes [provider]  cetirizine (ZYRTEC) 10 MG tablet Take 10 mg by mouth at  bedtime.   Yes [provider]  Cholecalciferol 25 MCG (1000 UT) tablet Take 1,000 Units by mouth daily.    Yes [provider]  clopidogrel (PLAVIX) 75 MG tablet Take 1 tablet (75 mg total) by mouth daily with breakfast. 06/11/16  Yes Fath, Darlin Priestly, MD  ezetimibe (ZETIA) 10 MG tablet Take 10 mg by mouth at bedtime.   Yes [provider]  fluticasone (FLONASE) 50 MCG/ACT nasal spray Place 2 sprays into both nostrils daily as needed for rhinitis.    Yes [provider]  furosemide (LASIX) 20 MG tablet Take 1 tablet (20 mg total) by mouth daily. 08/27/18  Yes Enid Baas, MD  isosorbide mononitrate (IMDUR) 30 MG 24 hr tablet Take 30 mg by mouth daily. 08/10/18  Yes [provider]  losartan (COZAAR) 50 MG tablet Take 1 tablet (50 mg total) by mouth daily. 08/26/18 08/26/19 Yes Enid Baas, MD  Lutein 20 MG TABS Take 1 tablet by mouth daily.    Yes [provider]  metFORMIN (GLUCOPHAGE) 1000 MG tablet Take 1,000 mg by mouth 2 (two) times daily. 08/16/18  Yes [provider]  Multiple Vitamin (MULTIVITAMIN) tablet Take 1 tablet by mouth daily.   Yes [provider]  Multiple Vitamins-Minerals (VISION FORMULA EYE HEALTH PO) Take 1 tablet by mouth daily.   Yes [provider]    Review of Systems  Constitutional: Positive for fatigue. Negative for appetite change.  HENT: Positive for congestion. Negative for postnasal drip and sore throat.   Eyes: Negative.   Respiratory: Positive for cough (dry). Negative for shortness of breath.   Cardiovascular: Negative for chest pain, palpitations and leg swelling.  Gastrointestinal: Negative for abdominal distention and abdominal pain.  Endocrine: Negative.   Genitourinary: Negative.   Musculoskeletal: Negative for back pain and neck pain.  Skin: Negative.   Allergic/Immunologic: Negative.   Neurological: Negative for dizziness and light-headedness.   Hematological: Negative for adenopathy. Does not bruise/bleed easily.  Psychiatric/Behavioral: Positive for sleep disturbance (wake up often). Negative for dysphoric mood. The patient is not nervous/anxious.    Vitals:   09/06/18 1429  BP: 124/78  Pulse: 89  Resp: 18  SpO2: 99%  Weight: 164 lb 8 oz (74.6 kg)  Height: 5\' 11"  (1.803 m)   Wt Readings from Last 3 Encounters:  09/06/18 164 lb 8 oz (74.6 kg)  09/06/18 165 lb 6.4 oz (75 kg)  08/26/18 161 lb 4.8 oz (73.2 kg)   Lab Results  Component Value Date   CREATININE 1.18 08/26/2018   CREATININE 1.27 (H) 08/25/2018   CREATININE 1.36 (H) 08/24/2018    Physical Exam Vitals signs and nursing note reviewed.  Constitutional:      Appearance: Normal appearance.  HENT:     Head: Normocephalic and atraumatic.  Neck:     Musculoskeletal: Normal range of motion and neck supple.  Cardiovascular:     Rate and Rhythm: Normal rate and regular rhythm.  Pulmonary:  Effort: Pulmonary effort is normal.     Breath sounds: Normal breath sounds. No wheezing or rales.  Abdominal:     General: There is no distension.     Palpations: Abdomen is soft.  Musculoskeletal:        General: No swelling or tenderness.  Skin:    General: Skin is warm and dry.  Neurological:     General: No focal deficit present.     Mental Status: He is alert and oriented to person, place, and time.  Psychiatric:        Mood and Affect: Mood normal.        Behavior: Behavior normal.    Assessment & Plan:  1: Chronic heart failure with reduced ejection fraction- - NYHA class II - euvolemic today - weighing daily and he was reminded to call for an overnight weight gain of >2 pounds or a weekly weight gain of >5 pounds - not adding salt and has been reading food labels. Reviewed the importance of closely following a 2000mg  sodium diet and written dietary information was given to him about this - saw cardiology (Fath) 09/02/18 - discussed possibly changing  his losartan to entresto; he would like to discuss with cardiologist - also consider titrating up carvedilol if possible at future visits - BNP 08/24/18 was 490.0 - will be doing heart track twice weekly  2: HTN- - BP looks good today - saw PCP ([place]) 09/02/18 - BMP from 08/26/18 reviewed and showed sodium 141, potassium 3.9, creatinine 1.18 and GFR 59  3: DM- - A1c 08/03/18 was 7.3%  Patient did not bring his medications nor a list. Each medication was verbally reviewed with the patient and he was encouraged to bring the bottles to every visit to confirm accuracy of list.  Return in 3 months or sooner for any questions/problems before then.

## 2018-09-06 ENCOUNTER — Encounter: Payer: Medicare Other | Attending: Internal Medicine

## 2018-09-06 ENCOUNTER — Ambulatory Visit: Payer: Medicare Other | Attending: Family | Admitting: Family

## 2018-09-06 ENCOUNTER — Encounter: Payer: Self-pay | Admitting: Family

## 2018-09-06 VITALS — BP 124/78 | HR 89 | Resp 18 | Ht 71.0 in | Wt 164.5 lb

## 2018-09-06 VITALS — Ht 68.5 in | Wt 165.4 lb

## 2018-09-06 DIAGNOSIS — I2510 Atherosclerotic heart disease of native coronary artery without angina pectoris: Secondary | ICD-10-CM | POA: Diagnosis not present

## 2018-09-06 DIAGNOSIS — Z7984 Long term (current) use of oral hypoglycemic drugs: Secondary | ICD-10-CM | POA: Insufficient documentation

## 2018-09-06 DIAGNOSIS — I5022 Chronic systolic (congestive) heart failure: Secondary | ICD-10-CM | POA: Diagnosis present

## 2018-09-06 DIAGNOSIS — I214 Non-ST elevation (NSTEMI) myocardial infarction: Secondary | ICD-10-CM | POA: Diagnosis present

## 2018-09-06 DIAGNOSIS — Z7982 Long term (current) use of aspirin: Secondary | ICD-10-CM | POA: Insufficient documentation

## 2018-09-06 DIAGNOSIS — Z955 Presence of coronary angioplasty implant and graft: Secondary | ICD-10-CM | POA: Insufficient documentation

## 2018-09-06 DIAGNOSIS — E785 Hyperlipidemia, unspecified: Secondary | ICD-10-CM | POA: Insufficient documentation

## 2018-09-06 DIAGNOSIS — Z8673 Personal history of transient ischemic attack (TIA), and cerebral infarction without residual deficits: Secondary | ICD-10-CM | POA: Insufficient documentation

## 2018-09-06 DIAGNOSIS — Z885 Allergy status to narcotic agent status: Secondary | ICD-10-CM | POA: Insufficient documentation

## 2018-09-06 DIAGNOSIS — N400 Enlarged prostate without lower urinary tract symptoms: Secondary | ICD-10-CM | POA: Diagnosis not present

## 2018-09-06 DIAGNOSIS — I252 Old myocardial infarction: Secondary | ICD-10-CM | POA: Diagnosis not present

## 2018-09-06 DIAGNOSIS — Z79899 Other long term (current) drug therapy: Secondary | ICD-10-CM | POA: Insufficient documentation

## 2018-09-06 DIAGNOSIS — E1122 Type 2 diabetes mellitus with diabetic chronic kidney disease: Secondary | ICD-10-CM

## 2018-09-06 DIAGNOSIS — N182 Chronic kidney disease, stage 2 (mild): Secondary | ICD-10-CM

## 2018-09-06 DIAGNOSIS — E1151 Type 2 diabetes mellitus with diabetic peripheral angiopathy without gangrene: Secondary | ICD-10-CM | POA: Insufficient documentation

## 2018-09-06 DIAGNOSIS — I110 Hypertensive heart disease with heart failure: Secondary | ICD-10-CM | POA: Insufficient documentation

## 2018-09-06 DIAGNOSIS — Z951 Presence of aortocoronary bypass graft: Secondary | ICD-10-CM | POA: Insufficient documentation

## 2018-09-06 DIAGNOSIS — I10 Essential (primary) hypertension: Secondary | ICD-10-CM

## 2018-09-06 NOTE — Progress Notes (Signed)
Daily Session Note  Patient Details  Name: Reginald Franklin MRN: [id number] Date of Birth: [birth year] Referring Provider:     Cardiac Rehab from 09/06/2018 in [facility]  Referring Provider  End, Harrell [facility] Cardiologist: Dr. Chrissie Noa Fath]      Encounter Date: 09/06/2018  Check In: Session Check In - 09/06/18 1309      Check-In   Supervising physician immediately available to respond to emergencies  See telemetry face sheet for immediately available ER MD    Location  [facility]-Cardiac & Pulmonary Rehab    Staff Present  Renita Papa, RN Darra Lis, RN BSN    Warm-up and Cool-down  Not performed (comment)   Med review   Resistance Training Performed  Yes    VAD Patient?  No    PAD/SET Patient?  No      Pain Assessment   Currently in Pain?  No/denies        Exercise Prescription Changes - 09/06/18 1300      Response to Exercise   Blood Pressure (Admit)  144/76    Blood Pressure (Exercise)  134/58    Blood Pressure (Exit)  132/70    Heart Rate (Admit)  66 bpm    Heart Rate (Exercise)  105 bpm    Heart Rate (Exit)  75 bpm    Oxygen Saturation (Admit)  98 %    Oxygen Saturation (Exercise)  96 %    Rating of Perceived Exertion (Exercise)  13    Symptoms  shuffles feet when walking       Social History   Tobacco Use  Smoking Status Never Smoker  Smokeless Tobacco Never Used    Goals Met:  Personal goals reviewed No report of cardiac concerns or symptoms  Goals Unmet:  Not Applicable  Comments: Med review completed    Dr. Emily Filbert is Medical Director for [facility].

## 2018-09-06 NOTE — Patient Instructions (Addendum)
Patient Instructions  Patient Details  Name: Reginald Franklin MRN: [id number] Date of Birth: [birth year] Referring Provider:  Yvonne KendallEnd, Christopher, MD  Below are your personal goals for exercise, nutrition, and risk factors. Our goal is to help you stay on track towards obtaining and maintaining these goals. We will be discussing your progress on these goals with you throughout the program.  Initial Exercise Prescription: Initial Exercise Prescription - 09/06/18 1300      Date of Initial Exercise RX and Referring Provider   Date  09/06/18    Referring Provider  End, [facility]   Primary Cardiologist: Dr. Harold HedgeKenneth Fath     Treadmill   MPH  1.6    Grade  0.5    Minutes  15    METs  2.34      NuStep   Level  2    SPM  80    Minutes  15    METs  2      Biostep-RELP   Level  2    SPM  50    Minutes  15    METs  2      Prescription Details   Frequency (times per week)  3    Duration  Progress to 30 minutes of continuous aerobic without signs/symptoms of physical distress      Intensity   THRR 40-80% of Max Heartrate  97-128    Ratings of Perceived Exertion  11-13    Perceived Dyspnea  0-4      Progression   Progression  Continue to progress workloads to maintain intensity without signs/symptoms of physical distress.      Resistance Training   Training Prescription  Yes    Weight  3 lbs    Reps  10-15       Exercise Goals: Frequency: Be able to perform aerobic exercise two to three times per week in program working toward 2-5 days per week of home exercise.  Intensity: Work with a perceived exertion of 11 (fairly light) - 15 (hard) while following your exercise prescription.  We will make changes to your prescription with you as you progress through the program.   Duration: Be able to do 30 to 45 minutes of continuous aerobic exercise in addition to a 5 minute warm-up and a 5 minute cool-down routine.   Nutrition Goals: Your personal nutrition goals will be  established when you do your nutrition analysis with the dietician.  The following are general nutrition guidelines to follow: Cholesterol < 200mg /day Sodium < 1500mg /day Fiber: Men over 50 yrs - 30 grams per day  Personal Goals: Personal Goals and Risk Factors at Admission - 09/06/18 1355      Core Components/Risk Factors/Patient Goals on Admission    Weight Management  Weight Maintenance;Yes    Intervention  Weight Management: Develop a combined nutrition and exercise program designed to reach desired caloric intake, while maintaining appropriate intake of nutrient and fiber, sodium and fats, and appropriate energy expenditure required for the weight goal.;Weight Management: Provide education and appropriate resources to help participant work on and attain dietary goals.    Admit Weight  165 lb 6.4 oz (75 kg)    Expected Outcomes  Short Term: Continue to assess and modify interventions until short term weight is achieved;Long Term: Adherence to nutrition and physical activity/exercise program aimed toward attainment of established weight goal;Weight Maintenance: Understanding of the daily nutrition guidelines, which includes 25-35% calories from fat, 7% or less cal from saturated fats, less  than 200mg  cholesterol, less than 1.5gm of sodium, & 5 or more servings of fruits and vegetables daily;Understanding recommendations for meals to include 15-35% energy as protein, 25-35% energy from fat, 35-60% energy from carbohydrates, less than 200mg  of dietary cholesterol, 20-35 gm of total fiber daily;Understanding of distribution of calorie intake throughout the day with the consumption of 4-5 meals/snacks    Diabetes  Yes    Intervention  Provide education about signs/symptoms and action to take for hypo/hyperglycemia.;Provide education about proper nutrition, including hydration, and aerobic/resistive exercise prescription along with prescribed medications to achieve blood glucose in normal ranges:  Fasting glucose 65-99 mg/dL    Expected Outcomes  Short Term: Participant verbalizes understanding of the signs/symptoms and immediate care of hyper/hypoglycemia, proper foot care and importance of medication, aerobic/resistive exercise and nutrition plan for blood glucose control.;Long Term: Attainment of HbA1C < 7%.    Heart Failure  Yes    Intervention  Provide a combined exercise and nutrition program that is supplemented with education, support and counseling about heart failure. Directed toward relieving symptoms such as shortness of breath, decreased exercise tolerance, and extremity edema.    Expected Outcomes  Improve functional capacity of life;Short term: Attendance in program 2-3 days a week with increased exercise capacity. Reported lower sodium intake. Reported increased fruit and vegetable intake. Reports medication compliance.;Short term: Daily weights obtained and reported for increase. Utilizing diuretic protocols set by physician.;Long term: Adoption of self-care skills and reduction of barriers for early signs and symptoms recognition and intervention leading to self-care maintenance.    Hypertension  Yes    Intervention  Provide education on lifestyle modifcations including regular physical activity/exercise, weight management, moderate sodium restriction and increased consumption of fresh fruit, vegetables, and low fat dairy, alcohol moderation, and smoking cessation.;Monitor prescription use compliance.    Expected Outcomes  Short Term: Continued assessment and intervention until BP is < 140/96mm HG in hypertensive participants. < 130/13mm HG in hypertensive participants with diabetes, heart failure or chronic kidney disease.;Long Term: Maintenance of blood pressure at goal levels.    Lipids  Yes    Intervention  Provide education and support for participant on nutrition & aerobic/resistive exercise along with prescribed medications to achieve LDL 70mg , HDL >40mg .    Expected  Outcomes  Short Term: Participant states understanding of desired cholesterol values and is compliant with medications prescribed. Participant is following exercise prescription and nutrition guidelines.;Long Term: Cholesterol controlled with medications as prescribed, with individualized exercise RX and with personalized nutrition plan. Value goals: LDL < 70mg , HDL > 40 mg.       Tobacco Use Initial Evaluation: Social History   Tobacco Use  Smoking Status Never Smoker  Smokeless Tobacco Never Used    Exercise Goals and Review: Exercise Goals    Row Name 09/06/18 1359             Exercise Goals   Increase Physical Activity  Yes       Intervention  Provide advice, education, support and counseling about physical activity/exercise needs.;Develop an individualized exercise prescription for aerobic and resistive training based on initial evaluation findings, risk stratification, comorbidities and participant's personal goals.       Expected Outcomes  Short Term: Attend rehab on a regular basis to increase amount of physical activity.;Long Term: Add in home exercise to make exercise part of routine and to increase amount of physical activity.;Long Term: Exercising regularly at least 3-5 days a week.       Increase Strength and  Stamina  Yes       Intervention  Provide advice, education, support and counseling about physical activity/exercise needs.;Develop an individualized exercise prescription for aerobic and resistive training based on initial evaluation findings, risk stratification, comorbidities and participant's personal goals.       Expected Outcomes  Short Term: Increase workloads from initial exercise prescription for resistance, speed, and METs.;Short Term: Perform resistance training exercises routinely during rehab and add in resistance training at home;Long Term: Improve cardiorespiratory fitness, muscular endurance and strength as measured by increased METs and functional capacity  ( )       Able to understand and use rate of perceived exertion (RPE) scale  Yes       Intervention  Provide education and explanation on how to use RPE scale       Expected Outcomes  Short Term: Able to use RPE daily in rehab to express subjective intensity level;Long Term:  Able to use RPE to guide intensity level when exercising independently       Knowledge and understanding of Target Heart Rate Range (THRR)  Yes       Intervention  Provide education and explanation of THRR including how the numbers were predicted and where they are located for reference       Expected Outcomes  Short Term: Able to state/look up THRR;Short Term: Able to use daily as guideline for intensity in rehab;Long Term: Able to use THRR to govern intensity when exercising independently       Able to check pulse independently  Yes       Intervention  Provide education and demonstration on how to check pulse in carotid and radial arteries.;Review the importance of being able to check your own pulse for safety during independent exercise       Expected Outcomes  Short Term: Able to explain why pulse checking is important during independent exercise;Long Term: Able to check pulse independently and accurately       Understanding of Exercise Prescription  Yes       Intervention  Provide education, explanation, and written materials on patient's individual exercise prescription       Expected Outcomes  Short Term: Able to explain program exercise prescription;Long Term: Able to explain home exercise prescription to exercise independently          Copy of goals given to participant.

## 2018-09-06 NOTE — Progress Notes (Signed)
Cardiac Individual Treatment Plan  Patient Details  Name: Reginald Franklin MRN: [id number] Date of Birth: 16-Apr-1941 Referring Provider:     Cardiac Rehab from 09/06/2018 in [facility] Cardiac and Pulmonary Rehab  Referring Provider  End, Harrell Gave [facility] Cardiologist: Dr. Chrissie Noa Fath]      Initial Encounter Date:    Cardiac Rehab from 09/06/2018 in [facility] Cardiac and Pulmonary Rehab  Date  09/06/18      Visit Diagnosis: NSTEMI (non-ST elevated myocardial infarction) [facility])  Status post coronary artery stent placement  Patient's Home Medications on Admission:  Current Outpatient Medications:  .  amLODipine (NORVASC) 5 MG tablet, Take 5 mg by mouth daily., Disp: , Rfl: 11 .  aspirin 81 MG chewable tablet, Chew 1 tablet (81 mg total) by mouth daily., Disp: 30 tablet, Rfl: 2 .  atorvastatin (LIPITOR) 80 MG tablet, Take 1 tablet (80 mg total) by mouth daily. (Patient taking differently: Take 20 mg by mouth daily. ), Disp: 30 tablet, Rfl: 1 .  Cholecalciferol 25 MCG (1000 UT) tablet, Take 1,000 Units by mouth daily. , Disp: , Rfl:  .  clopidogrel (PLAVIX) 75 MG tablet, Take 1 tablet (75 mg total) by mouth daily with breakfast., Disp: 30 tablet, Rfl: 11 .  ezetimibe (ZETIA) 10 MG tablet, Take 10 mg by mouth at bedtime., Disp: , Rfl:  .  fluticasone (FLONASE) 50 MCG/ACT nasal spray, Place 2 sprays into both nostrils daily as needed for rhinitis. , Disp: , Rfl:  .  furosemide (LASIX) 20 MG tablet, Take 1 tablet (20 mg total) by mouth daily., Disp: 30 tablet, Rfl: 1 .  isosorbide mononitrate (IMDUR) 30 MG 24 hr tablet, Take 30 mg by mouth daily., Disp: , Rfl: 11 .  losartan (COZAAR) 50 MG tablet, Take 1 tablet (50 mg total) by mouth daily., Disp: 30 tablet, Rfl: 11 .  Lutein 20 MG TABS, Take 1 tablet by mouth daily. , Disp: , Rfl:  .  metFORMIN (GLUCOPHAGE) 1000 MG tablet, Take 1,000 mg by mouth 2 (two) times daily., Disp: , Rfl: 10 .  Multiple Vitamin (MULTIVITAMIN) tablet, Take 1 tablet by  mouth daily., Disp: , Rfl:  .  Multiple Vitamins-Minerals (VISION FORMULA EYE HEALTH PO), Take 1 tablet by mouth daily., Disp: , Rfl:  .  carvedilol (COREG) 6.25 MG tablet, Take 6.25 mg by mouth 2 (two) times daily with a meal., Disp: , Rfl:  .  cetirizine (ZYRTEC) 10 MG tablet, Take 10 mg by mouth at bedtime., Disp: , Rfl:   Past Medical History: Past Medical History:  Diagnosis Date  . Anginal pain ([place])   . BPH (benign prostatic hyperplasia)   . Coronary artery disease   . Diabetes mellitus without complication [facility])    Patient takes Metformin.  . Erosive gastropathy   . Gastritis   . Hemorrhoids   . Hiatal hernia   . Hyperlipidemia   . Hypertension   . Nasal polyp   . Peripheral vascular disease ([place])   . S/P CABG x 3   . Seasonal allergies   . Stroke [facility])     Tobacco Use: Social History   Tobacco Use  Smoking Status Never Smoker  Smokeless Tobacco Never Used    Labs: Recent Review Flowsheet Data    There is no flowsheet data to display.       Exercise Target Goals: Exercise Program Goal: Individual exercise prescription set using results from initial 6 min walk test and THRR while considering  patient's activity barriers and  safety.   Exercise Prescription Goal: Initial exercise prescription builds to 30-45 minutes a day of aerobic activity, 2-3 days per week.  Home exercise guidelines will be given to patient during program as part of exercise prescription that the participant will acknowledge.  Activity Barriers & Risk Stratification: Activity Barriers & Cardiac Risk Stratification - 09/06/18 1331      Activity Barriers & Cardiac Risk Stratification   Activity Barriers  Joint Problems;Deconditioning;Muscular Weakness    Cardiac Risk Stratification  High       6 Minute Walk: 6 Minute Walk    Row Name 09/06/18 1352         6 Minute Walk   Phase  Initial     Distance  1088 feet     Walk Time  6 minutes     # of Rest Breaks  0     MPH  2.06      METS  2.41     RPE  13     VO2 Peak  8.44     Symptoms  Yes (comment)     Comments  shuffles feet while walking     Resting HR  66 bpm     Resting BP  144/76     Resting Oxygen Saturation   98 %     Exercise Oxygen Saturation  during 6 min walk  96 %     Max Ex. HR  105 bpm     Max Ex. BP  134/58     2 Minute Post BP  132/70        Oxygen Initial Assessment:   Oxygen Re-Evaluation:   Oxygen Discharge (Final Oxygen Re-Evaluation):   Initial Exercise Prescription: Initial Exercise Prescription - 09/06/18 1300      Date of Initial Exercise RX and Referring Provider   Date  09/06/18    Referring Provider  End, Harrell Gave [facility]   Primary Cardiologist: Dr. Bartholome Bill     Treadmill   MPH  1.6    Grade  0.5    Minutes  15    METs  2.34      NuStep   Level  2    SPM  80    Minutes  15    METs  2      Biostep-RELP   Level  2    SPM  50    Minutes  15    METs  2      Prescription Details   Frequency (times per week)  3    Duration  Progress to 30 minutes of continuous aerobic without signs/symptoms of physical distress      Intensity   THRR 40-80% of Max Heartrate  97-128    Ratings of Perceived Exertion  11-13    Perceived Dyspnea  0-4      Progression   Progression  Continue to progress workloads to maintain intensity without signs/symptoms of physical distress.      Resistance Training   Training Prescription  Yes    Weight  3 lbs    Reps  10-15       Perform Capillary Blood Glucose checks as needed.  Exercise Prescription Changes: Exercise Prescription Changes    Row Name 09/06/18 1300             Response to Exercise   Blood Pressure (Admit)  144/76       Blood Pressure (Exercise)  134/58       Blood Pressure (Exit)  132/70       Heart Rate (Admit)  66 bpm       Heart Rate (Exercise)  105 bpm       Heart Rate (Exit)  75 bpm       Oxygen Saturation (Admit)  98 %       Oxygen Saturation (Exercise)  96 %       Rating of Perceived Exertion  (Exercise)  13       Symptoms  shuffles feet when walking          Exercise Comments:   Exercise Goals and Review: Exercise Goals    Row Name 09/06/18 1359             Exercise Goals   Increase Physical Activity  Yes       Intervention  Provide advice, education, support and counseling about physical activity/exercise needs.;Develop an individualized exercise prescription for aerobic and resistive training based on initial evaluation findings, risk stratification, comorbidities and participant's personal goals.       Expected Outcomes  Short Term: Attend rehab on a regular basis to increase amount of physical activity.;Long Term: Add in home exercise to make exercise part of routine and to increase amount of physical activity.;Long Term: Exercising regularly at least 3-5 days a week.       Increase Strength and Stamina  Yes       Intervention  Provide advice, education, support and counseling about physical activity/exercise needs.;Develop an individualized exercise prescription for aerobic and resistive training based on initial evaluation findings, risk stratification, comorbidities and participant's personal goals.       Expected Outcomes  Short Term: Increase workloads from initial exercise prescription for resistance, speed, and METs.;Short Term: Perform resistance training exercises routinely during rehab and add in resistance training at home;Long Term: Improve cardiorespiratory fitness, muscular endurance and strength as measured by increased METs and functional capacity (6MWT)       Able to understand and use rate of perceived exertion (RPE) scale  Yes       Intervention  Provide education and explanation on how to use RPE scale       Expected Outcomes  Short Term: Able to use RPE daily in rehab to express subjective intensity level;Long Term:  Able to use RPE to guide intensity level when exercising independently       Knowledge and understanding of Target Heart Rate Range (THRR)   Yes       Intervention  Provide education and explanation of THRR including how the numbers were predicted and where they are located for reference       Expected Outcomes  Short Term: Able to state/look up THRR;Short Term: Able to use daily as guideline for intensity in rehab;Long Term: Able to use THRR to govern intensity when exercising independently       Able to check pulse independently  Yes       Intervention  Provide education and demonstration on how to check pulse in carotid and radial arteries.;Review the importance of being able to check your own pulse for safety during independent exercise       Expected Outcomes  Short Term: Able to explain why pulse checking is important during independent exercise;Long Term: Able to check pulse independently and accurately       Understanding of Exercise Prescription  Yes       Intervention  Provide education, explanation, and written materials on patient's individual exercise prescription  Expected Outcomes  Short Term: Able to explain program exercise prescription;Long Term: Able to explain home exercise prescription to exercise independently          Exercise Goals Re-Evaluation :   Discharge Exercise Prescription (Final Exercise Prescription Changes): Exercise Prescription Changes - 09/06/18 1300      Response to Exercise   Blood Pressure (Admit)  144/76    Blood Pressure (Exercise)  134/58    Blood Pressure (Exit)  132/70    Heart Rate (Admit)  66 bpm    Heart Rate (Exercise)  105 bpm    Heart Rate (Exit)  75 bpm    Oxygen Saturation (Admit)  98 %    Oxygen Saturation (Exercise)  96 %    Rating of Perceived Exertion (Exercise)  13    Symptoms  shuffles feet when walking       Nutrition:  Target Goals: Understanding of nutrition guidelines, daily intake of sodium '1500mg'$ , cholesterol '200mg'$ , calories 30% from fat and 7% or less from saturated fats, daily to have 5 or more servings of fruits and vegetables.  Biometrics: Pre  Biometrics - 09/06/18 1359      Pre Biometrics   Height  5' 8.5" (1.74 m)    Weight  165 lb 6.4 oz (75 kg)    Waist Circumference  36.5 inches    Hip Circumference  36.5 inches    Waist to Hip Ratio  1 %    BMI (Calculated)  24.78    Single Leg Stand  30 seconds        Nutrition Therapy Plan and Nutrition Goals: Nutrition Therapy & Goals - 09/06/18 1351      Intervention Plan   Intervention  Prescribe, educate and counsel regarding individualized specific dietary modifications aiming towards targeted core components such as weight, hypertension, lipid management, diabetes, heart failure and other comorbidities.;Nutrition handout(s) given to patient.    Expected Outcomes  Short Term Goal: Understand basic principles of dietary content, such as calories, fat, sodium, cholesterol and nutrients.;Short Term Goal: A plan has been developed with personal nutrition goals set during dietitian appointment.;Long Term Goal: Adherence to prescribed nutrition plan.       Nutrition Assessments: Nutrition Assessments - 09/06/18 1352      MEDFICTS Scores   Pre Score  29       Nutrition Goals Re-Evaluation:   Nutrition Goals Discharge (Final Nutrition Goals Re-Evaluation):   Psychosocial: Target Goals: Acknowledge presence or absence of significant depression and/or stress, maximize coping skills, provide positive support system. Participant is able to verbalize types and ability to use techniques and skills needed for reducing stress and depression.   Initial Review & Psychosocial Screening: Initial Psych Review & Screening - 09/06/18 1336      Initial Review   Current issues with  Current Stress Concerns    Source of Stress Concerns  Unable to perform yard/household activities;Unable to participate in former interests or hobbies      [place]?  Yes   Spouse     Barriers   Psychosocial barriers to participate in program  The patient should benefit from  training in stress management and relaxation.      Screening Interventions   Interventions  Encouraged to exercise    Expected Outcomes  Long Term Goal: Stressors or current issues are controlled or eliminated.;Short Term goal: Identification and review with participant of any Quality of Life or Depression concerns found by scoring the questionnaire.;Long Term goal:  The participant improves quality of Life and PHQ9 Scores as seen by post scores and/or verbalization of changes;Short Term goal: Utilizing psychosocial counselor, staff and physician to assist with identification of specific Stressors or current issues interfering with healing process. Setting desired goal for each stressor or current issue identified.       Quality of Life Scores:  Quality of Life - 09/06/18 1341      Quality of Life   Select  Quality of Life      Quality of Life Scores   Health/Function Pre  28.71 %    Socioeconomic Pre  29 %    Psych/Spiritual Pre  30 %    Family Pre  28.5 %    GLOBAL Pre  29.03 %      Scores of 19 and below usually indicate a poorer quality of life in these areas.  A difference of  2-3 points is a clinically meaningful difference.  A difference of 2-3 points in the total score of the Quality of Life Index has been associated with significant improvement in overall quality of life, self-image, physical symptoms, and general health in studies assessing change in quality of life.  PHQ-9: Recent Review Flowsheet Data    Depression screen [facility] 2/9 09/06/2018 09/11/2016 06/30/2016   Decreased Interest 0 2 0   Down, Depressed, Hopeless 0 2 0   PHQ - 2 Score 0 4 0   Altered sleeping '1 1 1   '$ Tired, decreased energy '1 1 1   '$ Change in appetite 0 1 0   Feeling bad or failure about yourself  0 0 0   Trouble concentrating 0 1 0   Moving slowly or fidgety/restless 0 2 0   Suicidal thoughts 0 0 0   PHQ-9 Score '2 10 2   '$ Difficult doing work/chores Not difficult at all Not difficult at all Not  difficult at all     Interpretation of Total Score  Total Score Depression Severity:  1-4 = Minimal depression, 5-9 = Mild depression, 10-14 = Moderate depression, 15-19 = Moderately severe depression, 20-27 = Severe depression   Psychosocial Evaluation and Intervention:   Psychosocial Re-Evaluation:   Psychosocial Discharge (Final Psychosocial Re-Evaluation):   Vocational Rehabilitation: Provide vocational rehab assistance to qualifying candidates.   Vocational Rehab Evaluation & Intervention: Vocational Rehab - 09/06/18 1354      Initial Vocational Rehab Evaluation & Intervention   Assessment shows need for Vocational Rehabilitation  No       Education: Education Goals: Education classes will be provided on a variety of topics geared toward better understanding of heart health and risk factor modification. Participant will state understanding/return demonstration of topics presented as noted by education test scores.  Learning Barriers/Preferences: Learning Barriers/Preferences - 09/06/18 1352      Learning Barriers/Preferences   Learning Barriers  Hearing    Learning Preferences  None       Education Topics:  AED/CPR: - Group verbal and written instruction with the use of models to demonstrate the basic use of the AED with the basic ABC's of resuscitation.   General Nutrition Guidelines/Fats and Fiber: -Group instruction provided by verbal, written material, models and posters to present the general guidelines for heart healthy nutrition. Gives an explanation and review of dietary fats and fiber.   Cardiac Rehab from 09/30/2016 in [facility] Cardiac and Pulmonary Rehab  Date  09/30/16  Educator  PI  Instruction Review Code (retired)  2- meets goals/outcomes      Controlling  Sodium/Reading Food Labels: -Group verbal and written material supporting the discussion of sodium use in heart healthy nutrition. Review and explanation with models, verbal and written materials  for utilization of the food label.   Cardiac Rehab from 09/30/2016 in [facility] Cardiac and Pulmonary Rehab  Date  08/12/16  Educator  PI  Instruction Review Code (retired)  2- meets goals/outcomes      Exercise Physiology & General Exercise Guidelines: - Group verbal and written instruction with models to review the exercise physiology of the cardiovascular system and associated critical values. Provides general exercise guidelines with specific guidelines to those with heart or lung disease.    Cardiac Rehab from 09/30/2016 in [facility] Cardiac and Pulmonary Rehab  Date  07/31/16  Educator  [facility]  Instruction Review Code (retired)  2- meets goals/outcomes      Aerobic Exercise & Resistance Training: - Gives group verbal and written instruction on the various components of exercise. Focuses on aerobic and resistive training programs and the benefits of this training and how to safely progress through these programs..   Flexibility, Balance, Mind/Body Relaxation: Provides group verbal/written instruction on the benefits of flexibility and balance training, including mind/body exercise modes such as yoga, pilates and tai chi.  Demonstration and skill practice provided.   Stress and Anxiety: - Provides group verbal and written instruction about the health risks of elevated stress and causes of high stress.  Discuss the correlation between heart/lung disease and anxiety and treatment options. Review healthy ways to manage with stress and anxiety.   Cardiac Rehab from 09/30/2016 in [facility] Cardiac and Pulmonary Rehab  Date  09/04/16  Educator  TS  Instruction Review Code (retired)  2- meets goals/outcomes      Depression: - Provides group verbal and written instruction on the correlation between heart/lung disease and depressed mood, treatment options, and the stigmas associated with seeking treatment.   Cardiac Rehab from 09/30/2016 in [facility] Cardiac and Pulmonary Rehab  Date  08/07/16  Educator  CE   Instruction Review Code (retired)  2- meets Designer, fashion/clothing & Physiology of the Heart: - Group verbal and written instruction and models provide basic cardiac anatomy and physiology, with the coronary electrical and arterial systems. Review of Valvular disease and Heart Failure   Cardiac Rehab from 09/30/2016 in [facility] Cardiac and Pulmonary Rehab  Date  08/26/16  Educator  SB  Instruction Review Code (retired)  2- meets goals/outcomes      Cardiac Procedures: - Group verbal and written instruction to review commonly prescribed medications for heart disease. Reviews the medication, class of the drug, and side effects. Includes the steps to properly store meds and maintain the prescription regimen. (beta blockers and nitrates)   Cardiac Rehab from 09/30/2016 in [facility] Cardiac and Pulmonary Rehab  Date  09/02/16  Educator  SB  Instruction Review Code (retired)  2- meets goals/outcomes      Cardiac Medications I: - Group verbal and written instruction to review commonly prescribed medications for heart disease. Reviews the medication, class of the drug, and side effects. Includes the steps to properly store meds and maintain the prescription regimen.   Cardiac Rehab from 09/30/2016 in [facility] Cardiac and Pulmonary Rehab  Date  09/11/16  Educator  TS  Instruction Review Code (retired)  2- meets goals/outcomes      Cardiac Medications II: -Group verbal and written instruction to review commonly prescribed medications for heart disease. Reviews the medication, class of the drug, and side  effects. (all other drug classes)    Go Sex-Intimacy & Heart Disease, Get SMART - Goal Setting: - Group verbal and written instruction through game format to discuss heart disease and the return to sexual intimacy. Provides group verbal and written material to discuss and apply goal setting through the application of the S.M.A.R.T. Method.   Cardiac Rehab from 09/30/2016 in [facility] Cardiac and Pulmonary  Rehab  Date  09/02/16  Educator  SB  Instruction Review Code (retired)  2- meets goals/outcomes      Other Matters of the Heart: - Provides group verbal, written materials and models to describe Stable Angina and Peripheral Artery. Includes description of the disease process and treatment options available to the cardiac patient.   Cardiac Rehab from 09/30/2016 in [facility] Cardiac and Pulmonary Rehab  Date  07/08/16  Educator  SB  Instruction Review Code (retired)  2- meets goals/outcomes      Exercise & Equipment Safety: - Individual verbal instruction and demonstration of equipment use and safety with use of the equipment.   Cardiac Rehab from 09/06/2018 in [facility] Cardiac and Pulmonary Rehab  Date  09/06/18  Educator  M.C  Instruction Review Code  1- Verbalizes Understanding      Infection Prevention: - Provides verbal and written material to individual with discussion of infection control including proper hand washing and proper equipment cleaning during exercise session.   Cardiac Rehab from 09/06/2018 in [facility] Cardiac and Pulmonary Rehab  Date  09/06/18  Educator  M.C  Instruction Review Code  1- Verbalizes Understanding      Falls Prevention: - Provides verbal and written material to individual with discussion of falls prevention and safety.   Cardiac Rehab from 09/06/2018 in [facility] Cardiac and Pulmonary Rehab  Date  09/06/18  Educator  M.C  Instruction Review Code  1- Verbalizes Understanding      Diabetes: - Individual verbal and written instruction to review signs/symptoms of diabetes, desired ranges of glucose level fasting, after meals and with exercise. Acknowledge that pre and post exercise glucose checks will be done for 3 sessions at entry of program.   Cardiac Rehab from 09/06/2018 in [facility] Cardiac and Pulmonary Rehab  Date  09/06/18  Educator  M.C  Instruction Review Code  1- Verbalizes Understanding      Know Your Numbers and Risk Factors: -Group verbal and  written instruction about important numbers in your health.  Discussion of what are risk factors and how they play a role in the disease process.  Review of Cholesterol, Blood Pressure, Diabetes, and BMI and the role they play in your overall health.   Sleep Hygiene: -Provides group verbal and written instruction about how sleep can affect your health.  Define sleep hygiene, discuss sleep cycles and impact of sleep habits. Review good sleep hygiene tips.    Other: -Provides group and verbal instruction on various topics (see comments)   Knowledge Questionnaire Score: Knowledge Questionnaire Score - 09/06/18 1353      Knowledge Questionnaire Score   Pre Score  20/26   Correct answers reviewed with [name]. Focus on angina, nutrition, exercise, and MI.       Core Components/Risk Factors/Patient Goals at Admission: Personal Goals and Risk Factors at Admission - 09/06/18 1355      Core Components/Risk Factors/Patient Goals on Admission    Weight Management  Weight Maintenance;Yes    Intervention  Weight Management: Develop a combined nutrition and exercise program designed to reach desired caloric intake, while maintaining appropriate intake  of nutrient and fiber, sodium and fats, and appropriate energy expenditure required for the weight goal.;Weight Management: Provide education and appropriate resources to help participant work on and attain dietary goals.    Admit Weight  165 lb 6.4 oz (75 kg)    Expected Outcomes  Short Term: Continue to assess and modify interventions until short term weight is achieved;Long Term: Adherence to nutrition and physical activity/exercise program aimed toward attainment of established weight goal;Weight Maintenance: Understanding of the daily nutrition guidelines, which includes 25-35% calories from fat, 7% or less cal from saturated fats, less than '200mg'$  cholesterol, less than 1.5gm of sodium, & 5 or more servings of fruits and vegetables daily;Understanding  recommendations for meals to include 15-35% energy as protein, 25-35% energy from fat, 35-60% energy from carbohydrates, less than '200mg'$  of dietary cholesterol, 20-35 gm of total fiber daily;Understanding of distribution of calorie intake throughout the day with the consumption of 4-5 meals/snacks    Diabetes  Yes    Intervention  Provide education about signs/symptoms and action to take for hypo/hyperglycemia.;Provide education about proper nutrition, including hydration, and aerobic/resistive exercise prescription along with prescribed medications to achieve blood glucose in normal ranges: Fasting glucose 65-99 mg/dL    Expected Outcomes  Short Term: Participant verbalizes understanding of the signs/symptoms and immediate care of hyper/hypoglycemia, proper foot care and importance of medication, aerobic/resistive exercise and nutrition plan for blood glucose control.;Long Term: Attainment of HbA1C < 7%.    Heart Failure  Yes    Intervention  Provide a combined exercise and nutrition program that is supplemented with education, support and counseling about heart failure. Directed toward relieving symptoms such as shortness of breath, decreased exercise tolerance, and extremity edema.    Expected Outcomes  Improve functional capacity of life;Short term: Attendance in program 2-3 days a week with increased exercise capacity. Reported lower sodium intake. Reported increased fruit and vegetable intake. Reports medication compliance.;Short term: Daily weights obtained and reported for increase. Utilizing diuretic protocols set by physician.;Long term: Adoption of self-care skills and reduction of barriers for early signs and symptoms recognition and intervention leading to self-care maintenance.    Hypertension  Yes    Intervention  Provide education on lifestyle modifcations including regular physical activity/exercise, weight management, moderate sodium restriction and increased consumption of fresh fruit,  vegetables, and low fat dairy, alcohol moderation, and smoking cessation.;Monitor prescription use compliance.    Expected Outcomes  Short Term: Continued assessment and intervention until BP is < 140/51m HG in hypertensive participants. < 130/81mHG in hypertensive participants with diabetes, heart failure or chronic kidney disease.;Long Term: Maintenance of blood pressure at goal levels.    Lipids  Yes    Intervention  Provide education and support for participant on nutrition & aerobic/resistive exercise along with prescribed medications to achieve LDL '70mg'$ , HDL >'40mg'$ .    Expected Outcomes  Short Term: Participant states understanding of desired cholesterol values and is compliant with medications prescribed. Participant is following exercise prescription and nutrition guidelines.;Long Term: Cholesterol controlled with medications as prescribed, with individualized exercise RX and with personalized nutrition plan. Value goals: LDL < '70mg'$ , HDL > 40 mg.       Core Components/Risk Factors/Patient Goals Review:    Core Components/Risk Factors/Patient Goals at Discharge (Final Review):    ITP Comments: ITP Comments    Row Name 09/06/18 1313           ITP Comments  Med review completed. Initial ITP created, [facility]  Comments: Initial ITP

## 2018-09-06 NOTE — Patient Instructions (Signed)
Continue weighing daily and call for an overnight weight gain of > 2 pounds or a weekly weight gain of >5 pounds. 

## 2018-09-21 ENCOUNTER — Encounter: Payer: Self-pay | Admitting: *Deleted

## 2018-09-21 DIAGNOSIS — Z955 Presence of coronary angioplasty implant and graft: Secondary | ICD-10-CM

## 2018-09-21 DIAGNOSIS — I214 Non-ST elevation (NSTEMI) myocardial infarction: Secondary | ICD-10-CM

## 2018-09-21 NOTE — Progress Notes (Signed)
Cardiac Individual Treatment Plan  Patient Details  Name: Reginald Franklin MRN: [id number] Date of Birth: 1940/12/19 Referring Provider:     Cardiac Rehab from 09/06/2018 in [facility] Cardiac and Pulmonary Rehab  Referring Provider  End, Harrell Gave [facility] Cardiologist: Dr. Chrissie Noa Fath]      Initial Encounter Date:    Cardiac Rehab from 09/06/2018 in [facility] Cardiac and Pulmonary Rehab  Date  09/06/18      Visit Diagnosis: NSTEMI (non-ST elevated myocardial infarction) [facility])  Status post coronary artery stent placement  Patient's Home Medications on Admission:  Current Outpatient Medications:  .  amLODipine (NORVASC) 5 MG tablet, Take 5 mg by mouth daily., Disp: , Rfl: 11 .  aspirin 81 MG chewable tablet, Chew 1 tablet (81 mg total) by mouth daily., Disp: 30 tablet, Rfl: 2 .  atorvastatin (LIPITOR) 80 MG tablet, Take 1 tablet (80 mg total) by mouth daily. (Patient taking differently: Take 20 mg by mouth daily. ), Disp: 30 tablet, Rfl: 1 .  carvedilol (COREG) 6.25 MG tablet, Take 6.25 mg by mouth 2 (two) times daily with a meal., Disp: , Rfl:  .  cetirizine (ZYRTEC) 10 MG tablet, Take 10 mg by mouth at bedtime., Disp: , Rfl:  .  Cholecalciferol 25 MCG (1000 UT) tablet, Take 1,000 Units by mouth daily. , Disp: , Rfl:  .  clopidogrel (PLAVIX) 75 MG tablet, Take 1 tablet (75 mg total) by mouth daily with breakfast., Disp: 30 tablet, Rfl: 11 .  ezetimibe (ZETIA) 10 MG tablet, Take 10 mg by mouth at bedtime., Disp: , Rfl:  .  fluticasone (FLONASE) 50 MCG/ACT nasal spray, Place 2 sprays into both nostrils daily as needed for rhinitis. , Disp: , Rfl:  .  furosemide (LASIX) 20 MG tablet, Take 1 tablet (20 mg total) by mouth daily., Disp: 30 tablet, Rfl: 1 .  isosorbide mononitrate (IMDUR) 30 MG 24 hr tablet, Take 30 mg by mouth daily., Disp: , Rfl: 11 .  losartan (COZAAR) 50 MG tablet, Take 1 tablet (50 mg total) by mouth daily., Disp: 30 tablet, Rfl: 11 .  Lutein 20 MG TABS, Take 1 tablet by  mouth daily. , Disp: , Rfl:  .  metFORMIN (GLUCOPHAGE) 1000 MG tablet, Take 1,000 mg by mouth 2 (two) times daily., Disp: , Rfl: 10 .  Multiple Vitamin (MULTIVITAMIN) tablet, Take 1 tablet by mouth daily., Disp: , Rfl:  .  Multiple Vitamins-Minerals (VISION FORMULA EYE HEALTH PO), Take 1 tablet by mouth daily., Disp: , Rfl:   Past Medical History: Past Medical History:  Diagnosis Date  . Anginal pain ([place])   . BPH (benign prostatic hyperplasia)   . CHF (congestive heart failure) ([place])   . Coronary artery disease   . Diabetes mellitus without complication [facility])    Patient takes Metformin.  . Erosive gastropathy   . Gastritis   . Hemorrhoids   . Hiatal hernia   . Hyperlipidemia   . Hypertension   . Nasal polyp   . Peripheral vascular disease ([place])   . S/P CABG x 3   . Seasonal allergies   . Stroke [facility])     Tobacco Use: Social History   Tobacco Use  Smoking Status Never Smoker  Smokeless Tobacco Never Used    Labs: Recent Review Flowsheet Data    There is no flowsheet data to display.       Exercise Target Goals: Exercise Program Goal: Individual exercise prescription set using results from initial 6 min walk test and  THRR while considering  patient's activity barriers and safety.   Exercise Prescription Goal: Initial exercise prescription builds to 30-45 minutes a day of aerobic activity, 2-3 days per week.  Home exercise guidelines will be given to patient during program as part of exercise prescription that the participant will acknowledge.  Activity Barriers & Risk Stratification: Activity Barriers & Cardiac Risk Stratification - 09/06/18 1331      Activity Barriers & Cardiac Risk Stratification   Activity Barriers  Joint Problems;Deconditioning;Muscular Weakness    Cardiac Risk Stratification  High       6 Minute Walk: 6 Minute Walk    Row Name 09/06/18 1352         6 Minute Walk   Phase  Initial     Distance  1088 feet     Walk Time  6 minutes      # of Rest Breaks  0     MPH  2.06     METS  2.41     RPE  13     VO2 Peak  8.44     Symptoms  Yes (comment)     Comments  shuffles feet while walking     Resting HR  66 bpm     Resting BP  144/76     Resting Oxygen Saturation   98 %     Exercise Oxygen Saturation  during 6 min walk  96 %     Max Ex. HR  105 bpm     Max Ex. BP  134/58     2 Minute Post BP  132/70        Oxygen Initial Assessment:   Oxygen Re-Evaluation:   Oxygen Discharge (Final Oxygen Re-Evaluation):   Initial Exercise Prescription: Initial Exercise Prescription - 09/06/18 1300      Date of Initial Exercise RX and Referring Provider   Date  09/06/18    Referring Provider  End, Harrell Gave [facility]   Primary Cardiologist: Dr. Bartholome Bill     Treadmill   MPH  1.6    Grade  0.5    Minutes  15    METs  2.34      NuStep   Level  2    SPM  80    Minutes  15    METs  2      Biostep-RELP   Level  2    SPM  50    Minutes  15    METs  2      Prescription Details   Frequency (times per week)  3    Duration  Progress to 30 minutes of continuous aerobic without signs/symptoms of physical distress      Intensity   THRR 40-80% of Max Heartrate  97-128    Ratings of Perceived Exertion  11-13    Perceived Dyspnea  0-4      Progression   Progression  Continue to progress workloads to maintain intensity without signs/symptoms of physical distress.      Resistance Training   Training Prescription  Yes    Weight  3 lbs    Reps  10-15       Perform Capillary Blood Glucose checks as needed.  Exercise Prescription Changes: Exercise Prescription Changes    Row Name 09/06/18 1300             Response to Exercise   Blood Pressure (Admit)  144/76       Blood Pressure (Exercise)  134/58  Blood Pressure (Exit)  132/70       Heart Rate (Admit)  66 bpm       Heart Rate (Exercise)  105 bpm       Heart Rate (Exit)  75 bpm       Oxygen Saturation (Admit)  98 %       Oxygen Saturation  (Exercise)  96 %       Rating of Perceived Exertion (Exercise)  13       Symptoms  shuffles feet when walking          Exercise Comments:   Exercise Goals and Review: Exercise Goals    Row Name 09/06/18 1359             Exercise Goals   Increase Physical Activity  Yes       Intervention  Provide advice, education, support and counseling about physical activity/exercise needs.;Develop an individualized exercise prescription for aerobic and resistive training based on initial evaluation findings, risk stratification, comorbidities and participant's personal goals.       Expected Outcomes  Short Term: Attend rehab on a regular basis to increase amount of physical activity.;Long Term: Add in home exercise to make exercise part of routine and to increase amount of physical activity.;Long Term: Exercising regularly at least 3-5 days a week.       Increase Strength and Stamina  Yes       Intervention  Provide advice, education, support and counseling about physical activity/exercise needs.;Develop an individualized exercise prescription for aerobic and resistive training based on initial evaluation findings, risk stratification, comorbidities and participant's personal goals.       Expected Outcomes  Short Term: Increase workloads from initial exercise prescription for resistance, speed, and METs.;Short Term: Perform resistance training exercises routinely during rehab and add in resistance training at home;Long Term: Improve cardiorespiratory fitness, muscular endurance and strength as measured by increased METs and functional capacity (6MWT)       Able to understand and use rate of perceived exertion (RPE) scale  Yes       Intervention  Provide education and explanation on how to use RPE scale       Expected Outcomes  Short Term: Able to use RPE daily in rehab to express subjective intensity level;Long Term:  Able to use RPE to guide intensity level when exercising independently       Knowledge  and understanding of Target Heart Rate Range (THRR)  Yes       Intervention  Provide education and explanation of THRR including how the numbers were predicted and where they are located for reference       Expected Outcomes  Short Term: Able to state/look up THRR;Short Term: Able to use daily as guideline for intensity in rehab;Long Term: Able to use THRR to govern intensity when exercising independently       Able to check pulse independently  Yes       Intervention  Provide education and demonstration on how to check pulse in carotid and radial arteries.;Review the importance of being able to check your own pulse for safety during independent exercise       Expected Outcomes  Short Term: Able to explain why pulse checking is important during independent exercise;Long Term: Able to check pulse independently and accurately       Understanding of Exercise Prescription  Yes       Intervention  Provide education, explanation, and written materials on patient's individual exercise prescription  Expected Outcomes  Short Term: Able to explain program exercise prescription;Long Term: Able to explain home exercise prescription to exercise independently          Exercise Goals Re-Evaluation :   Discharge Exercise Prescription (Final Exercise Prescription Changes): Exercise Prescription Changes - 09/06/18 1300      Response to Exercise   Blood Pressure (Admit)  144/76    Blood Pressure (Exercise)  134/58    Blood Pressure (Exit)  132/70    Heart Rate (Admit)  66 bpm    Heart Rate (Exercise)  105 bpm    Heart Rate (Exit)  75 bpm    Oxygen Saturation (Admit)  98 %    Oxygen Saturation (Exercise)  96 %    Rating of Perceived Exertion (Exercise)  13    Symptoms  shuffles feet when walking       Nutrition:  Target Goals: Understanding of nutrition guidelines, daily intake of sodium '1500mg'$ , cholesterol '200mg'$ , calories 30% from fat and 7% or less from saturated fats, daily to have 5 or more  servings of fruits and vegetables.  Biometrics: Pre Biometrics - 09/06/18 1359      Pre Biometrics   Height  5' 8.5" (1.74 m)    Weight  165 lb 6.4 oz (75 kg)    Waist Circumference  36.5 inches    Hip Circumference  36.5 inches    Waist to Hip Ratio  1 %    BMI (Calculated)  24.78    Single Leg Stand  30 seconds        Nutrition Therapy Plan and Nutrition Goals: Nutrition Therapy & Goals - 09/06/18 1351      Intervention Plan   Intervention  Prescribe, educate and counsel regarding individualized specific dietary modifications aiming towards targeted core components such as weight, hypertension, lipid management, diabetes, heart failure and other comorbidities.;Nutrition handout(s) given to patient.    Expected Outcomes  Short Term Goal: Understand basic principles of dietary content, such as calories, fat, sodium, cholesterol and nutrients.;Short Term Goal: A plan has been developed with personal nutrition goals set during dietitian appointment.;Long Term Goal: Adherence to prescribed nutrition plan.       Nutrition Assessments: Nutrition Assessments - 09/06/18 1352      MEDFICTS Scores   Pre Score  29       Nutrition Goals Re-Evaluation:   Nutrition Goals Discharge (Final Nutrition Goals Re-Evaluation):   Psychosocial: Target Goals: Acknowledge presence or absence of significant depression and/or stress, maximize coping skills, provide positive support system. Participant is able to verbalize types and ability to use techniques and skills needed for reducing stress and depression.   Initial Review & Psychosocial Screening: Initial Psych Review & Screening - 09/06/18 1336      Initial Review   Current issues with  Current Stress Concerns    Source of Stress Concerns  Unable to perform yard/household activities;Unable to participate in former interests or hobbies      [place]?  Yes   Spouse     Barriers   Psychosocial barriers to  participate in program  The patient should benefit from training in stress management and relaxation.      Screening Interventions   Interventions  Encouraged to exercise    Expected Outcomes  Long Term Goal: Stressors or current issues are controlled or eliminated.;Short Term goal: Identification and review with participant of any Quality of Life or Depression concerns found by scoring the questionnaire.;Long Term goal:  The participant improves quality of Life and PHQ9 Scores as seen by post scores and/or verbalization of changes;Short Term goal: Utilizing psychosocial counselor, staff and physician to assist with identification of specific Stressors or current issues interfering with healing process. Setting desired goal for each stressor or current issue identified.       Quality of Life Scores:  Quality of Life - 09/06/18 1341      Quality of Life   Select  Quality of Life      Quality of Life Scores   Health/Function Pre  28.71 %    Socioeconomic Pre  29 %    Psych/Spiritual Pre  30 %    Family Pre  28.5 %    GLOBAL Pre  29.03 %      Scores of 19 and below usually indicate a poorer quality of life in these areas.  A difference of  2-3 points is a clinically meaningful difference.  A difference of 2-3 points in the total score of the Quality of Life Index has been associated with significant improvement in overall quality of life, self-image, physical symptoms, and general health in studies assessing change in quality of life.  PHQ-9: Recent Review Flowsheet Data    Depression screen [facility] 2/9 09/06/2018 09/11/2016 06/30/2016   Decreased Interest 0 2 0   Down, Depressed, Hopeless 0 2 0   PHQ - 2 Score 0 4 0   Altered sleeping '1 1 1   '$ Tired, decreased energy '1 1 1   '$ Change in appetite 0 1 0   Feeling bad or failure about yourself  0 0 0   Trouble concentrating 0 1 0   Moving slowly or fidgety/restless 0 2 0   Suicidal thoughts 0 0 0   PHQ-9 Score '2 10 2   '$ Difficult doing  work/chores Not difficult at all Not difficult at all Not difficult at all     Interpretation of Total Score  Total Score Depression Severity:  1-4 = Minimal depression, 5-9 = Mild depression, 10-14 = Moderate depression, 15-19 = Moderately severe depression, 20-27 = Severe depression   Psychosocial Evaluation and Intervention:   Psychosocial Re-Evaluation:   Psychosocial Discharge (Final Psychosocial Re-Evaluation):   Vocational Rehabilitation: Provide vocational rehab assistance to qualifying candidates.   Vocational Rehab Evaluation & Intervention: Vocational Rehab - 09/06/18 1354      Initial Vocational Rehab Evaluation & Intervention   Assessment shows need for Vocational Rehabilitation  No       Education: Education Goals: Education classes will be provided on a variety of topics geared toward better understanding of heart health and risk factor modification. Participant will state understanding/return demonstration of topics presented as noted by education test scores.  Learning Barriers/Preferences: Learning Barriers/Preferences - 09/06/18 1352      Learning Barriers/Preferences   Learning Barriers  Hearing    Learning Preferences  None       Education Topics:  AED/CPR: - Group verbal and written instruction with the use of models to demonstrate the basic use of the AED with the basic ABC's of resuscitation.   General Nutrition Guidelines/Fats and Fiber: -Group instruction provided by verbal, written material, models and posters to present the general guidelines for heart healthy nutrition. Gives an explanation and review of dietary fats and fiber.   Cardiac Rehab from 09/30/2016 in [facility] Cardiac and Pulmonary Rehab  Date  09/30/16  Educator  PI  Instruction Review Code (retired)  2- meets goals/outcomes      Controlling  Sodium/Reading Food Labels: -Group verbal and written material supporting the discussion of sodium use in heart healthy nutrition. Review  and explanation with models, verbal and written materials for utilization of the food label.   Cardiac Rehab from 09/30/2016 in [facility] Cardiac and Pulmonary Rehab  Date  08/12/16  Educator  PI  Instruction Review Code (retired)  2- meets goals/outcomes      Exercise Physiology & General Exercise Guidelines: - Group verbal and written instruction with models to review the exercise physiology of the cardiovascular system and associated critical values. Provides general exercise guidelines with specific guidelines to those with heart or lung disease.    Cardiac Rehab from 09/30/2016 in [facility] Cardiac and Pulmonary Rehab  Date  07/31/16  Educator  [facility]  Instruction Review Code (retired)  2- meets goals/outcomes      Aerobic Exercise & Resistance Training: - Gives group verbal and written instruction on the various components of exercise. Focuses on aerobic and resistive training programs and the benefits of this training and how to safely progress through these programs..   Flexibility, Balance, Mind/Body Relaxation: Provides group verbal/written instruction on the benefits of flexibility and balance training, including mind/body exercise modes such as yoga, pilates and tai chi.  Demonstration and skill practice provided.   Stress and Anxiety: - Provides group verbal and written instruction about the health risks of elevated stress and causes of high stress.  Discuss the correlation between heart/lung disease and anxiety and treatment options. Review healthy ways to manage with stress and anxiety.   Cardiac Rehab from 09/30/2016 in [facility] Cardiac and Pulmonary Rehab  Date  09/04/16  Educator  TS  Instruction Review Code (retired)  2- meets goals/outcomes      Depression: - Provides group verbal and written instruction on the correlation between heart/lung disease and depressed mood, treatment options, and the stigmas associated with seeking treatment.   Cardiac Rehab from 09/30/2016 in [facility] Cardiac  and Pulmonary Rehab  Date  08/07/16  Educator  CE  Instruction Review Code (retired)  2- meets Designer, fashion/clothing & Physiology of the Heart: - Group verbal and written instruction and models provide basic cardiac anatomy and physiology, with the coronary electrical and arterial systems. Review of Valvular disease and Heart Failure   Cardiac Rehab from 09/30/2016 in [facility] Cardiac and Pulmonary Rehab  Date  08/26/16  Educator  SB  Instruction Review Code (retired)  2- meets goals/outcomes      Cardiac Procedures: - Group verbal and written instruction to review commonly prescribed medications for heart disease. Reviews the medication, class of the drug, and side effects. Includes the steps to properly store meds and maintain the prescription regimen. (beta blockers and nitrates)   Cardiac Rehab from 09/30/2016 in [facility] Cardiac and Pulmonary Rehab  Date  09/02/16  Educator  SB  Instruction Review Code (retired)  2- meets goals/outcomes      Cardiac Medications I: - Group verbal and written instruction to review commonly prescribed medications for heart disease. Reviews the medication, class of the drug, and side effects. Includes the steps to properly store meds and maintain the prescription regimen.   Cardiac Rehab from 09/30/2016 in [facility] Cardiac and Pulmonary Rehab  Date  09/11/16  Educator  TS  Instruction Review Code (retired)  2- meets goals/outcomes      Cardiac Medications II: -Group verbal and written instruction to review commonly prescribed medications for heart disease. Reviews the medication, class of the drug, and side  effects. (all other drug classes)    Go Sex-Intimacy & Heart Disease, Get SMART - Goal Setting: - Group verbal and written instruction through game format to discuss heart disease and the return to sexual intimacy. Provides group verbal and written material to discuss and apply goal setting through the application of the S.M.A.R.T. Method.   Cardiac  Rehab from 09/30/2016 in [facility] Cardiac and Pulmonary Rehab  Date  09/02/16  Educator  SB  Instruction Review Code (retired)  2- meets goals/outcomes      Other Matters of the Heart: - Provides group verbal, written materials and models to describe Stable Angina and Peripheral Artery. Includes description of the disease process and treatment options available to the cardiac patient.   Cardiac Rehab from 09/30/2016 in [facility] Cardiac and Pulmonary Rehab  Date  07/08/16  Educator  SB  Instruction Review Code (retired)  2- meets goals/outcomes      Exercise & Equipment Safety: - Individual verbal instruction and demonstration of equipment use and safety with use of the equipment.   Cardiac Rehab from 09/06/2018 in [facility] Cardiac and Pulmonary Rehab  Date  09/06/18  Educator  M.C  Instruction Review Code  1- Verbalizes Understanding      Infection Prevention: - Provides verbal and written material to individual with discussion of infection control including proper hand washing and proper equipment cleaning during exercise session.   Cardiac Rehab from 09/06/2018 in [facility] Cardiac and Pulmonary Rehab  Date  09/06/18  Educator  M.C  Instruction Review Code  1- Verbalizes Understanding      Falls Prevention: - Provides verbal and written material to individual with discussion of falls prevention and safety.   Cardiac Rehab from 09/06/2018 in [facility] Cardiac and Pulmonary Rehab  Date  09/06/18  Educator  M.C  Instruction Review Code  1- Verbalizes Understanding      Diabetes: - Individual verbal and written instruction to review signs/symptoms of diabetes, desired ranges of glucose level fasting, after meals and with exercise. Acknowledge that pre and post exercise glucose checks will be done for 3 sessions at entry of program.   Cardiac Rehab from 09/06/2018 in [facility] Cardiac and Pulmonary Rehab  Date  09/06/18  Educator  M.C  Instruction Review Code  1- Verbalizes Understanding      Know  Your Numbers and Risk Factors: -Group verbal and written instruction about important numbers in your health.  Discussion of what are risk factors and how they play a role in the disease process.  Review of Cholesterol, Blood Pressure, Diabetes, and BMI and the role they play in your overall health.   Sleep Hygiene: -Provides group verbal and written instruction about how sleep can affect your health.  Define sleep hygiene, discuss sleep cycles and impact of sleep habits. Review good sleep hygiene tips.    Other: -Provides group and verbal instruction on various topics (see comments)   Knowledge Questionnaire Score: Knowledge Questionnaire Score - 09/06/18 1353      Knowledge Questionnaire Score   Pre Score  20/26   Correct answers reviewed with [name]. Focus on angina, nutrition, exercise, and MI.       Core Components/Risk Factors/Patient Goals at Admission: Personal Goals and Risk Factors at Admission - 09/06/18 1355      Core Components/Risk Factors/Patient Goals on Admission    Weight Management  Weight Maintenance;Yes    Intervention  Weight Management: Develop a combined nutrition and exercise program designed to reach desired caloric intake, while maintaining appropriate intake  of nutrient and fiber, sodium and fats, and appropriate energy expenditure required for the weight goal.;Weight Management: Provide education and appropriate resources to help participant work on and attain dietary goals.    Admit Weight  165 lb 6.4 oz (75 kg)    Expected Outcomes  Short Term: Continue to assess and modify interventions until short term weight is achieved;Long Term: Adherence to nutrition and physical activity/exercise program aimed toward attainment of established weight goal;Weight Maintenance: Understanding of the daily nutrition guidelines, which includes 25-35% calories from fat, 7% or less cal from saturated fats, less than '200mg'$  cholesterol, less than 1.5gm of sodium, & 5 or more  servings of fruits and vegetables daily;Understanding recommendations for meals to include 15-35% energy as protein, 25-35% energy from fat, 35-60% energy from carbohydrates, less than '200mg'$  of dietary cholesterol, 20-35 gm of total fiber daily;Understanding of distribution of calorie intake throughout the day with the consumption of 4-5 meals/snacks    Diabetes  Yes    Intervention  Provide education about signs/symptoms and action to take for hypo/hyperglycemia.;Provide education about proper nutrition, including hydration, and aerobic/resistive exercise prescription along with prescribed medications to achieve blood glucose in normal ranges: Fasting glucose 65-99 mg/dL    Expected Outcomes  Short Term: Participant verbalizes understanding of the signs/symptoms and immediate care of hyper/hypoglycemia, proper foot care and importance of medication, aerobic/resistive exercise and nutrition plan for blood glucose control.;Long Term: Attainment of HbA1C < 7%.    Heart Failure  Yes    Intervention  Provide a combined exercise and nutrition program that is supplemented with education, support and counseling about heart failure. Directed toward relieving symptoms such as shortness of breath, decreased exercise tolerance, and extremity edema.    Expected Outcomes  Improve functional capacity of life;Short term: Attendance in program 2-3 days a week with increased exercise capacity. Reported lower sodium intake. Reported increased fruit and vegetable intake. Reports medication compliance.;Short term: Daily weights obtained and reported for increase. Utilizing diuretic protocols set by physician.;Long term: Adoption of self-care skills and reduction of barriers for early signs and symptoms recognition and intervention leading to self-care maintenance.    Hypertension  Yes    Intervention  Provide education on lifestyle modifcations including regular physical activity/exercise, weight management, moderate sodium  restriction and increased consumption of fresh fruit, vegetables, and low fat dairy, alcohol moderation, and smoking cessation.;Monitor prescription use compliance.    Expected Outcomes  Short Term: Continued assessment and intervention until BP is < 140/31m HG in hypertensive participants. < 130/828mHG in hypertensive participants with diabetes, heart failure or chronic kidney disease.;Long Term: Maintenance of blood pressure at goal levels.    Lipids  Yes    Intervention  Provide education and support for participant on nutrition & aerobic/resistive exercise along with prescribed medications to achieve LDL '70mg'$ , HDL >'40mg'$ .    Expected Outcomes  Short Term: Participant states understanding of desired cholesterol values and is compliant with medications prescribed. Participant is following exercise prescription and nutrition guidelines.;Long Term: Cholesterol controlled with medications as prescribed, with individualized exercise RX and with personalized nutrition plan. Value goals: LDL < '70mg'$ , HDL > 40 mg.       Core Components/Risk Factors/Patient Goals Review:    Core Components/Risk Factors/Patient Goals at Discharge (Final Review):    ITP Comments: ITP Comments    Row Name 09/06/18 1313 09/21/18 0629         ITP Comments  Med review completed. Initial ITP created, [facility]  [facility] Day Review. Continue with ITP unless directed changes per Medical Director review.  Starts on 09/28/2018         Comments:

## 2018-09-28 ENCOUNTER — Encounter: Payer: Medicare Other | Attending: Internal Medicine | Admitting: *Deleted

## 2018-09-28 DIAGNOSIS — I214 Non-ST elevation (NSTEMI) myocardial infarction: Secondary | ICD-10-CM | POA: Insufficient documentation

## 2018-09-28 DIAGNOSIS — Z955 Presence of coronary angioplasty implant and graft: Secondary | ICD-10-CM | POA: Diagnosis not present

## 2018-09-28 LAB — GLUCOSE, CAPILLARY
Glucose-Capillary: 185 mg/dL — ABNORMAL HIGH (ref 70–99)
Glucose-Capillary: 116 mg/dL — ABNORMAL HIGH (ref 70–99)

## 2018-09-28 NOTE — Progress Notes (Signed)
Daily Session Note  Patient Details  Name: Reginald Franklin MRN: [id number] Date of Birth: 22-Nov-1940 Referring Provider:     Cardiac Rehab from 09/06/2018 in [facility] Cardiac and Pulmonary Rehab  Referring Provider  End, Harrell Gave [facility] Cardiologist: Dr. Chrissie Noa Fath]      Encounter Date: 09/28/2018  Check In: Session Check In - 09/28/18 0924      Check-In   Supervising physician immediately available to respond to emergencies  See telemetry face sheet for immediately available ER MD    Location  [facility]-Cardiac & Pulmonary Rehab    Staff Present  Heath Lark, RN, BSN, CCRP;Jeanna Durrell BS, Exercise Physiologist;Basia Mcginty [place], MA, RCEP, CCRP, Exercise Physiologist;Amanda Oletta Darter, BA, ACSM CEP, Exercise Physiologist    Medication changes reported      No    Fall or balance concerns reported     No    Warm-up and Cool-down  Performed as group-led instruction    Resistance Training Performed  Yes    VAD Patient?  No    PAD/SET Patient?  No      Pain Assessment   Currently in Pain?  No/denies          Social History   Tobacco Use  Smoking Status Never Smoker  Smokeless Tobacco Never Used    Goals Met:  Exercise tolerated well Personal goals reviewed No report of cardiac concerns or symptoms Strength training completed today  Goals Unmet:  Not Applicable  Comments: First full day of exercise!  Patient was oriented to gym and equipment including functions, settings, policies, and procedures.  Patient's individual exercise prescription and treatment plan were reviewed.  All starting workloads were established based on the results of the 6 minute walk test done at initial orientation visit.  The plan for exercise progression was also introduced and progression will be customized based on patient's performance and goals.    Dr. Emily Filbert is Medical Director for [place] and [facility].

## 2018-09-30 DIAGNOSIS — I214 Non-ST elevation (NSTEMI) myocardial infarction: Secondary | ICD-10-CM

## 2018-09-30 DIAGNOSIS — Z955 Presence of coronary angioplasty implant and graft: Secondary | ICD-10-CM

## 2018-09-30 LAB — GLUCOSE, CAPILLARY
Glucose-Capillary: 205 mg/dL — ABNORMAL HIGH (ref 70–99)
Glucose-Capillary: 139 mg/dL — ABNORMAL HIGH (ref 70–99)

## 2018-09-30 NOTE — Progress Notes (Signed)
Daily Session Note  Patient Details  Name: CURLY MACKOWSKI MRN: [id number] Date of Birth: 1941/03/11 Referring Provider:     Cardiac Rehab from 09/06/2018 in [facility] Cardiac and Pulmonary Rehab  Referring Provider  End, Harrell Gave [facility] Cardiologist: Dr. Chrissie Noa Fath]      Encounter Date: 09/30/2018  Check In: Session Check In - 09/30/18 0923      Check-In   Supervising physician immediately available to respond to emergencies  See telemetry face sheet for immediately available ER MD    Location  [facility]-Cardiac & Pulmonary Rehab    Staff Present  Gerlene Burdock, RN, BSN;Jaretzi Droz BS, Exercise Physiologist;Jessica [place], MA, RCEP, CCRP, Exercise Physiologist    Medication changes reported      No    Fall or balance concerns reported     No    Tobacco Cessation  No Change    Warm-up and Cool-down  Performed as group-led instruction    Resistance Training Performed  Yes    VAD Patient?  No    PAD/SET Patient?  No      Pain Assessment   Currently in Pain?  No/denies    Multiple Pain Sites  No          Social History   Tobacco Use  Smoking Status Never Smoker  Smokeless Tobacco Never Used    Goals Met:  Independence with exercise equipment Exercise tolerated well No report of cardiac concerns or symptoms Strength training completed today  Goals Unmet:  Not Applicable  Comments: Pt able to follow exercise prescription today without complaint.  Will continue to monitor for progression.    Dr. Emily Filbert is Medical Director for [place] and [facility].

## 2018-10-05 ENCOUNTER — Encounter: Payer: Medicare Other | Admitting: *Deleted

## 2018-10-05 DIAGNOSIS — Z955 Presence of coronary angioplasty implant and graft: Secondary | ICD-10-CM

## 2018-10-05 DIAGNOSIS — I214 Non-ST elevation (NSTEMI) myocardial infarction: Secondary | ICD-10-CM

## 2018-10-05 LAB — GLUCOSE, CAPILLARY
Glucose-Capillary: 180 mg/dL — ABNORMAL HIGH (ref 70–99)
Glucose-Capillary: 122 mg/dL — ABNORMAL HIGH (ref 70–99)

## 2018-10-05 NOTE — Progress Notes (Signed)
Daily Session Note  Patient Details  Name: Reginald Franklin MRN: [id number] Date of Birth: 02/07/1941 Referring Provider:     Cardiac Rehab from 09/06/2018 in [facility] Cardiac and Pulmonary Rehab  Referring Provider  End, Harrell Gave [facility] Cardiologist: Dr. Chrissie Noa Fath]      Encounter Date: 10/05/2018  Check In: Session Check In - 10/05/18 0920      Check-In   Supervising physician immediately available to respond to emergencies  See telemetry face sheet for immediately available ER MD    Location  [facility]-Cardiac & Pulmonary Rehab    Staff Present  Heath Lark, RN, BSN, CCRP;Jerris Fleer [place], MA, RCEP, CCRP, Exercise Physiologist;Joseph [employer], [place], ACSM CEP, Exercise Physiologist    Medication changes reported      No    Fall or balance concerns reported     No    Warm-up and Cool-down  Performed as group-led instruction    Resistance Training Performed  Yes    VAD Patient?  No    PAD/SET Patient?  No      Pain Assessment   Currently in Pain?  No/denies          Social History   Tobacco Use  Smoking Status Never Smoker  Smokeless Tobacco Never Used    Goals Met:  Independence with exercise equipment Exercise tolerated well No report of cardiac concerns or symptoms Strength training completed today  Goals Unmet:  Not Applicable  Comments: Pt able to follow exercise prescription today without complaint.  Will continue to monitor for progression.    Dr. Emily Filbert is Medical Director for [place] and [facility].

## 2018-10-07 ENCOUNTER — Encounter: Payer: Medicare Other | Admitting: *Deleted

## 2018-10-07 DIAGNOSIS — I214 Non-ST elevation (NSTEMI) myocardial infarction: Secondary | ICD-10-CM

## 2018-10-07 DIAGNOSIS — Z955 Presence of coronary angioplasty implant and graft: Secondary | ICD-10-CM

## 2018-10-07 NOTE — Progress Notes (Signed)
Daily Session Note  Patient Details  Name: Reginald Franklin MRN: [id number] Date of Birth: 09-13-1941 Referring Provider:     Cardiac Rehab from 09/06/2018 in [facility] Cardiac and Pulmonary Rehab  Referring Provider  End, Harrell Gave [facility] Cardiologist: Dr. Chrissie Noa Fath]      Encounter Date: 10/07/2018  Check In: Session Check In - 10/07/18 0915      Check-In   Supervising physician immediately available to respond to emergencies  See telemetry face sheet for immediately available ER MD    Location  [facility]-Cardiac & Pulmonary Rehab    Staff Present  Vida Rigger RN, BSN;Jeanna Durrell BS, Exercise Physiologist;Allyce Bochicchio Luan Pulling, MA, RCEP, CCRP, Exercise Physiologist;Joseph Tessie Fass RCP,RRT,BSRT    Medication changes reported      No    Fall or balance concerns reported     No    Warm-up and Cool-down  Performed as group-led instruction    Resistance Training Performed  Yes    VAD Patient?  No    PAD/SET Patient?  No      Pain Assessment   Currently in Pain?  No/denies          Social History   Tobacco Use  Smoking Status Never Smoker  Smokeless Tobacco Never Used    Goals Met:  Independence with exercise equipment Exercise tolerated well No report of cardiac concerns or symptoms Strength training completed today  Goals Unmet:  Not Applicable  Comments: Pt able to follow exercise prescription today without complaint.  Will continue to monitor for progression.    Dr. Emily Filbert is Medical Director for [place] and [facility].

## 2018-10-12 DIAGNOSIS — I214 Non-ST elevation (NSTEMI) myocardial infarction: Secondary | ICD-10-CM

## 2018-10-12 DIAGNOSIS — Z955 Presence of coronary angioplasty implant and graft: Secondary | ICD-10-CM

## 2018-10-12 NOTE — Progress Notes (Signed)
Daily Session Note  Patient Details  Name: Reginald Franklin MRN: [id number] Date of Birth: 1941-09-17 Referring Provider:     Cardiac Rehab from 09/06/2018 in [facility] Cardiac and Pulmonary Rehab  Referring Provider  End, Harrell Gave [facility] Cardiologist: Dr. Chrissie Noa Fath]      Encounter Date: 10/12/2018  Check In: Session Check In - 10/12/18 0951      Check-In   Supervising physician immediately available to respond to emergencies  See telemetry face sheet for immediately available ER MD    Location  [facility]-Cardiac & Pulmonary Rehab    Staff Present  Heath Lark, RN, BSN, CCRP;Weston Fulco BS, Exercise Physiologist;Jessica [place], MA, RCEP, CCRP, Exercise Physiologist    Medication changes reported      No    Fall or balance concerns reported     No    Tobacco Cessation  No Change    Warm-up and Cool-down  Performed as group-led instruction    Resistance Training Performed  Yes    VAD Patient?  No    PAD/SET Patient?  No      Pain Assessment   Currently in Pain?  No/denies    Multiple Pain Sites  No          Social History   Tobacco Use  Smoking Status Never Smoker  Smokeless Tobacco Never Used    Goals Met:  Independence with exercise equipment Exercise tolerated well No report of cardiac concerns or symptoms Strength training completed today  Goals Unmet:  Not Applicable  Comments: Pt able to follow exercise prescription today without complaint.  Will continue to monitor for progression.    Dr. Emily Filbert is Medical Director for [place] and [facility].

## 2018-10-14 DIAGNOSIS — I214 Non-ST elevation (NSTEMI) myocardial infarction: Secondary | ICD-10-CM | POA: Diagnosis not present

## 2018-10-14 NOTE — Progress Notes (Signed)
Daily Session Note  Patient Details  Name: Reginald Franklin MRN: [id number] Date of Birth: 1940/10/19 Referring Provider:     Cardiac Rehab from 09/06/2018 in [facility] Cardiac and Pulmonary Rehab  Referring Provider  End, Harrell Gave [facility] Cardiologist: Dr. Chrissie Noa Fath]      Encounter Date: 10/14/2018  Check In: Session Check In - 10/14/18 0922      Check-In   Supervising physician immediately available to respond to emergencies  See telemetry face sheet for immediately available ER MD    Location  [facility]-Cardiac & Pulmonary Rehab    Staff Present  Justin Mend Lorre Nick, MA, RCEP, CCRP, Exercise Physiologist;Carroll Enterkin, RN, BSN    Medication changes reported      No    Fall or balance concerns reported     No    Warm-up and Cool-down  Performed as group-led instruction    Resistance Training Performed  Yes    VAD Patient?  No    PAD/SET Patient?  No      Pain Assessment   Currently in Pain?  No/denies          Social History   Tobacco Use  Smoking Status Never Smoker  Smokeless Tobacco Never Used    Goals Met:  Independence with exercise equipment Exercise tolerated well No report of cardiac concerns or symptoms Strength training completed today  Goals Unmet:  Not Applicable  Comments: Pt able to follow exercise prescription today without complaint.  Will continue to monitor for progression.    Dr. Emily Filbert is Medical Director for [place] and [facility].

## 2018-10-19 DIAGNOSIS — Z955 Presence of coronary angioplasty implant and graft: Secondary | ICD-10-CM

## 2018-10-19 DIAGNOSIS — I214 Non-ST elevation (NSTEMI) myocardial infarction: Secondary | ICD-10-CM

## 2018-10-19 NOTE — Progress Notes (Signed)
Daily Session Note  Patient Details  Name: KHAYMAN KIRSCH MRN: [id number] Date of Birth: 09-Jan-1941 Referring Provider:     Cardiac Rehab from 09/06/2018 in [facility] Cardiac and Pulmonary Rehab  Referring Provider  End, Harrell Gave [facility] Cardiologist: Dr. Chrissie Noa Fath]      Encounter Date: 10/19/2018  Check In: Session Check In - 10/19/18 0926      Check-In   Supervising physician immediately available to respond to emergencies  See telemetry face sheet for immediately available ER MD    Location  [facility]-Cardiac & Pulmonary Rehab    Staff Present  Jasper Loser BS, Exercise Physiologist;Vasiliki Smaldone Luan Pulling, MA, RCEP, CCRP, Exercise Physiologist;Amanda Oletta Darter, BA, ACSM CEP, Exercise Physiologist;Krista Frederico Hamman, RN BSN    Medication changes reported      No    Fall or balance concerns reported     No    Warm-up and Cool-down  Performed as group-led Higher education careers adviser Performed  Yes    VAD Patient?  No    PAD/SET Patient?  No      Pain Assessment   Currently in Pain?  No/denies          Social History   Tobacco Use  Smoking Status Never Smoker  Smokeless Tobacco Never Used    Goals Met:  Independence with exercise equipment Exercise tolerated well No report of cardiac concerns or symptoms Strength training completed today  Goals Unmet:  Not Applicable  Comments: Pt able to follow exercise prescription today without complaint.  Will continue to monitor for progression.    Dr. Emily Filbert is Medical Director for [place] and [facility].

## 2018-10-20 DIAGNOSIS — I214 Non-ST elevation (NSTEMI) myocardial infarction: Secondary | ICD-10-CM

## 2018-10-20 NOTE — Progress Notes (Signed)
Cardiac Individual Treatment Plan  Patient Details  Name: Reginald Franklin MRN: [id number] Date of Birth: 05/30/1941 Referring Provider:     Cardiac Rehab from 09/06/2018 in [facility] Cardiac and Pulmonary Rehab  Referring Provider  End, Harrell Gave [facility] Cardiologist: Dr. Chrissie Noa Fath]      Initial Encounter Date:    Cardiac Rehab from 09/06/2018 in [facility] Cardiac and Pulmonary Rehab  Date  09/06/18      Visit Diagnosis: NSTEMI (non-ST elevated myocardial infarction) [facility])  Patient's Home Medications on Admission:  Current Outpatient Medications:  .  amLODipine (NORVASC) 5 MG tablet, Take 5 mg by mouth daily., Disp: , Rfl: 11 .  aspirin 81 MG chewable tablet, Chew 1 tablet (81 mg total) by mouth daily., Disp: 30 tablet, Rfl: 2 .  atorvastatin (LIPITOR) 80 MG tablet, Take 1 tablet (80 mg total) by mouth daily. (Patient taking differently: Take 20 mg by mouth daily. ), Disp: 30 tablet, Rfl: 1 .  carvedilol (COREG) 6.25 MG tablet, Take 6.25 mg by mouth 2 (two) times daily with a meal., Disp: , Rfl:  .  cetirizine (ZYRTEC) 10 MG tablet, Take 10 mg by mouth at bedtime., Disp: , Rfl:  .  Cholecalciferol 25 MCG (1000 UT) tablet, Take 1,000 Units by mouth daily. , Disp: , Rfl:  .  clopidogrel (PLAVIX) 75 MG tablet, Take 1 tablet (75 mg total) by mouth daily with breakfast., Disp: 30 tablet, Rfl: 11 .  ezetimibe (ZETIA) 10 MG tablet, Take 10 mg by mouth at bedtime., Disp: , Rfl:  .  fluticasone (FLONASE) 50 MCG/ACT nasal spray, Place 2 sprays into both nostrils daily as needed for rhinitis. , Disp: , Rfl:  .  furosemide (LASIX) 20 MG tablet, Take 1 tablet (20 mg total) by mouth daily., Disp: 30 tablet, Rfl: 1 .  isosorbide mononitrate (IMDUR) 30 MG 24 hr tablet, Take 30 mg by mouth daily., Disp: , Rfl: 11 .  losartan (COZAAR) 50 MG tablet, Take 1 tablet (50 mg total) by mouth daily., Disp: 30 tablet, Rfl: 11 .  Lutein 20 MG TABS, Take 1 tablet by mouth daily. , Disp: , Rfl:  .  metFORMIN  (GLUCOPHAGE) 1000 MG tablet, Take 1,000 mg by mouth 2 (two) times daily., Disp: , Rfl: 10 .  Multiple Vitamin (MULTIVITAMIN) tablet, Take 1 tablet by mouth daily., Disp: , Rfl:  .  Multiple Vitamins-Minerals (VISION FORMULA EYE HEALTH PO), Take 1 tablet by mouth daily., Disp: , Rfl:   Past Medical History: Past Medical History:  Diagnosis Date  . Anginal pain ([place])   . BPH (benign prostatic hyperplasia)   . CHF (congestive heart failure) ([place])   . Coronary artery disease   . Diabetes mellitus without complication [facility])    Patient takes Metformin.  . Erosive gastropathy   . Gastritis   . Hemorrhoids   . Hiatal hernia   . Hyperlipidemia   . Hypertension   . Nasal polyp   . Peripheral vascular disease ([place])   . S/P CABG x 3   . Seasonal allergies   . Stroke [facility])     Tobacco Use: Social History   Tobacco Use  Smoking Status Never Smoker  Smokeless Tobacco Never Used    Labs: Recent Review Flowsheet Data    There is no flowsheet data to display.       Exercise Target Goals: Exercise Program Goal: Individual exercise prescription set using results from initial 6 min walk test and THRR while considering  patient's activity barriers  and safety.   Exercise Prescription Goal: Initial exercise prescription builds to 30-45 minutes a day of aerobic activity, 2-3 days per week.  Home exercise guidelines will be given to patient during program as part of exercise prescription that the participant will acknowledge.  Activity Barriers & Risk Stratification: Activity Barriers & Cardiac Risk Stratification - 09/06/18 1331      Activity Barriers & Cardiac Risk Stratification   Activity Barriers  Joint Problems;Deconditioning;Muscular Weakness    Cardiac Risk Stratification  High       6 Minute Walk: 6 Minute Walk    Row Name 09/06/18 1352         6 Minute Walk   Phase  Initial     Distance  1088 feet     Walk Time  6 minutes     # of Rest Breaks  0     MPH  2.06      METS  2.41     RPE  13     VO2 Peak  8.44     Symptoms  Yes (comment)     Comments  shuffles feet while walking     Resting HR  66 bpm     Resting BP  144/76     Resting Oxygen Saturation   98 %     Exercise Oxygen Saturation  during 6 min walk  96 %     Max Ex. HR  105 bpm     Max Ex. BP  134/58     2 Minute Post BP  132/70        Oxygen Initial Assessment:   Oxygen Re-Evaluation:   Oxygen Discharge (Final Oxygen Re-Evaluation):   Initial Exercise Prescription: Initial Exercise Prescription - 09/06/18 1300      Date of Initial Exercise RX and Referring Provider   Date  09/06/18    Referring Provider  End, Harrell Gave [facility]   Primary Cardiologist: Dr. Bartholome Bill     Treadmill   MPH  1.6    Grade  0.5    Minutes  15    METs  2.34      NuStep   Level  2    SPM  80    Minutes  15    METs  2      Biostep-RELP   Level  2    SPM  50    Minutes  15    METs  2      Prescription Details   Frequency (times per week)  3    Duration  Progress to 30 minutes of continuous aerobic without signs/symptoms of physical distress      Intensity   THRR 40-80% of Max Heartrate  97-128    Ratings of Perceived Exertion  11-13    Perceived Dyspnea  0-4      Progression   Progression  Continue to progress workloads to maintain intensity without signs/symptoms of physical distress.      Resistance Training   Training Prescription  Yes    Weight  3 lbs    Reps  10-15       Perform Capillary Blood Glucose checks as needed.  Exercise Prescription Changes: Exercise Prescription Changes    Row Name 09/06/18 1300 09/28/18 1400 10/12/18 1400         Response to Exercise   Blood Pressure (Admit)  144/76  122/70  132/70     Blood Pressure (Exercise)  134/58  150/98  124/70  Blood Pressure (Exit)  132/70  128/62  112/78     Heart Rate (Admit)  66 bpm  90 bpm  74 bpm     Heart Rate (Exercise)  105 bpm  108 bpm  110 bpm     Heart Rate (Exit)  75 bpm  85 bpm  82 bpm      Oxygen Saturation (Admit)  98 %  -  -     Oxygen Saturation (Exercise)  96 %  -  -     Rating of Perceived Exertion (Exercise)  '13  13  12     ' Symptoms  shuffles feet when walking  shuffles feet when walking  none     Comments  -  first full day of exercise  -     Duration  -  Continue with 30 min of aerobic exercise without signs/symptoms of physical distress.  Continue with 30 min of aerobic exercise without signs/symptoms of physical distress.     Intensity  -  THRR unchanged  THRR unchanged       Progression   Progression  -  Continue to progress workloads to maintain intensity without signs/symptoms of physical distress.  Continue to progress workloads to maintain intensity without signs/symptoms of physical distress.     Average METs  -  2.52  2.31       Resistance Training   Training Prescription  -  Yes  Yes     Weight  -  3 lbs  4 lbs     Reps  -  10-15  10-15       Interval Training   Interval Training  -  No  No       Treadmill   MPH  -  1.6  1.6     Grade  -  0.5  0.5     Minutes  -  15  15     METs  -  2.34  2.34       NuStep   Level  -  2  2     Minutes  -  15  15     METs  -  2.7  2.6       Biostep-RELP   Level  -  -  3     Minutes  -  -  15     METs  -  -  2       Home Exercise Plan   Plans to continue exercise at  -  -  Home (comment) treadmill     Frequency  -  -  Add 3 additional days to program exercise sessions.     Initial Home Exercises Provided  -  -  10/05/18        Exercise Comments: Exercise Comments    Row Name 09/28/18 [phone]           Exercise Comments  First full day of exercise!  Patient was oriented to gym and equipment including functions, settings, policies, and procedures.  Patient's individual exercise prescription and treatment plan were reviewed.  All starting workloads were established based on the results of the 6 minute walk test done at initial orientation visit.  The plan for exercise progression was also introduced and  progression will be customized based on patient's performance and goals.          Exercise Goals and Review: Exercise Goals    Row Name 09/06/18 1359  Exercise Goals   Increase Physical Activity  Yes       Intervention  Provide advice, education, support and counseling about physical activity/exercise needs.;Develop an individualized exercise prescription for aerobic and resistive training based on initial evaluation findings, risk stratification, comorbidities and participant's personal goals.       Expected Outcomes  Short Term: Attend rehab on a regular basis to increase amount of physical activity.;Long Term: Add in home exercise to make exercise part of routine and to increase amount of physical activity.;Long Term: Exercising regularly at least 3-5 days a week.       Increase Strength and Stamina  Yes       Intervention  Provide advice, education, support and counseling about physical activity/exercise needs.;Develop an individualized exercise prescription for aerobic and resistive training based on initial evaluation findings, risk stratification, comorbidities and participant's personal goals.       Expected Outcomes  Short Term: Increase workloads from initial exercise prescription for resistance, speed, and METs.;Short Term: Perform resistance training exercises routinely during rehab and add in resistance training at home;Long Term: Improve cardiorespiratory fitness, muscular endurance and strength as measured by increased METs and functional capacity (6MWT)       Able to understand and use rate of perceived exertion (RPE) scale  Yes       Intervention  Provide education and explanation on how to use RPE scale       Expected Outcomes  Short Term: Able to use RPE daily in rehab to express subjective intensity level;Long Term:  Able to use RPE to guide intensity level when exercising independently       Knowledge and understanding of Target Heart Rate Range (THRR)  Yes        Intervention  Provide education and explanation of THRR including how the numbers were predicted and where they are located for reference       Expected Outcomes  Short Term: Able to state/look up THRR;Short Term: Able to use daily as guideline for intensity in rehab;Long Term: Able to use THRR to govern intensity when exercising independently       Able to check pulse independently  Yes       Intervention  Provide education and demonstration on how to check pulse in carotid and radial arteries.;Review the importance of being able to check your own pulse for safety during independent exercise       Expected Outcomes  Short Term: Able to explain why pulse checking is important during independent exercise;Long Term: Able to check pulse independently and accurately       Understanding of Exercise Prescription  Yes       Intervention  Provide education, explanation, and written materials on patient's individual exercise prescription       Expected Outcomes  Short Term: Able to explain program exercise prescription;Long Term: Able to explain home exercise prescription to exercise independently          Exercise Goals Re-Evaluation : Exercise Goals Re-Evaluation    Row Name 09/28/18 0925 10/12/18 1406           Exercise Goal Re-Evaluation   Exercise Goals Review  Increase Physical Activity;Increase Strength and Stamina;Able to understand and use rate of perceived exertion (RPE) scale;Knowledge and understanding of Target Heart Rate Range (THRR);Understanding of Exercise Prescription  Increase Physical Activity;Increase Strength and Stamina;Understanding of Exercise Prescription      Comments  Reviewed RPE scale, THR and program prescription with pt today.  Pt voiced understanding and  was given a copy of goals to take home.   Reginald Franklin has been doing well in rehab.  He went over home exercise guidelines last week and plans to use his treadmill at home.  He is up to level 3 on the BioStep.  We will continue  to monitor his progress.       Expected Outcomes  Short: Use RPE daily to regulate intensity. Long: Follow program prescription in THR.  Short: Increase workloads.  Long: Continue to exercise more at home.          Discharge Exercise Prescription (Final Exercise Prescription Changes): Exercise Prescription Changes - 10/12/18 1400      Response to Exercise   Blood Pressure (Admit)  132/70    Blood Pressure (Exercise)  124/70    Blood Pressure (Exit)  112/78    Heart Rate (Admit)  74 bpm    Heart Rate (Exercise)  110 bpm    Heart Rate (Exit)  82 bpm    Rating of Perceived Exertion (Exercise)  12    Symptoms  none    Duration  Continue with 30 min of aerobic exercise without signs/symptoms of physical distress.    Intensity  THRR unchanged      Progression   Progression  Continue to progress workloads to maintain intensity without signs/symptoms of physical distress.    Average METs  2.31      Resistance Training   Training Prescription  Yes    Weight  4 lbs    Reps  10-15      Interval Training   Interval Training  No      Treadmill   MPH  1.6    Grade  0.5    Minutes  15    METs  2.34      NuStep   Level  2    Minutes  15    METs  2.6      Biostep-RELP   Level  3    Minutes  15    METs  2      Home Exercise Plan   Plans to continue exercise at  Home (comment)   treadmill   Frequency  Add 3 additional days to program exercise sessions.    Initial Home Exercises Provided  10/05/18       Nutrition:  Target Goals: Understanding of nutrition guidelines, daily intake of sodium <1518m, cholesterol <2047m calories 30% from fat and 7% or less from saturated fats, daily to have 5 or more servings of fruits and vegetables.  Biometrics: Pre Biometrics - 09/06/18 1359      Pre Biometrics   Height  5' 8.5" (1.74 m)    Weight  165 lb 6.4 oz (75 kg)    Waist Circumference  36.5 inches    Hip Circumference  36.5 inches    Waist to Hip Ratio  1 %    BMI (Calculated)   24.78    Single Leg Stand  30 seconds        Nutrition Therapy Plan and Nutrition Goals: Nutrition Therapy & Goals - 09/28/18 1018      Nutrition Therapy   RD appointment deferred  Yes   he has been through this program before and has had prior nutrition education      Nutrition Assessments: Nutrition Assessments - 09/06/18 1352      MEDFICTS Scores   Pre Score  29       Nutrition Goals Re-Evaluation:   Nutrition Goals Discharge (  Final Nutrition Goals Re-Evaluation):   Psychosocial: Target Goals: Acknowledge presence or absence of significant depression and/or stress, maximize coping skills, provide positive support system. Participant is able to verbalize types and ability to use techniques and skills needed for reducing stress and depression.   Initial Review & Psychosocial Screening: Initial Psych Review & Screening - 09/06/18 1336      Initial Review   Current issues with  Current Stress Concerns    Source of Stress Concerns  Unable to perform yard/household activities;Unable to participate in former interests or hobbies      [place]?  Yes   Spouse     Barriers   Psychosocial barriers to participate in program  The patient should benefit from training in stress management and relaxation.      Screening Interventions   Interventions  Encouraged to exercise    Expected Outcomes  Long Term Goal: Stressors or current issues are controlled or eliminated.;Short Term goal: Identification and review with participant of any Quality of Life or Depression concerns found by scoring the questionnaire.;Long Term goal: The participant improves quality of Life and PHQ9 Scores as seen by post scores and/or verbalization of changes;Short Term goal: Utilizing psychosocial counselor, staff and physician to assist with identification of specific Stressors or current issues interfering with healing process. Setting desired goal for each stressor or current  issue identified.       Quality of Life Scores:  Quality of Life - 09/06/18 1341      Quality of Life   Select  Quality of Life      Quality of Life Scores   Health/Function Pre  28.71 %    Socioeconomic Pre  29 %    Psych/Spiritual Pre  30 %    Family Pre  28.5 %    GLOBAL Pre  29.03 %      Scores of 19 and below usually indicate a poorer quality of life in these areas.  A difference of  2-3 points is a clinically meaningful difference.  A difference of 2-3 points in the total score of the Quality of Life Index has been associated with significant improvement in overall quality of life, self-image, physical symptoms, and general health in studies assessing change in quality of life.  PHQ-9: Recent Review Flowsheet Data    Depression screen [facility] 2/9 09/06/2018 09/11/2016 06/30/2016   Decreased Interest 0 2 0   Down, Depressed, Hopeless 0 2 0   PHQ - 2 Score 0 4 0   Altered sleeping '1 1 1   ' Tired, decreased energy '1 1 1   ' Change in appetite 0 1 0   Feeling bad or failure about yourself  0 0 0   Trouble concentrating 0 1 0   Moving slowly or fidgety/restless 0 2 0   Suicidal thoughts 0 0 0   PHQ-9 Score '2 10 2   ' Difficult doing work/chores Not difficult at all Not difficult at all Not difficult at all     Interpretation of Total Score  Total Score Depression Severity:  1-4 = Minimal depression, 5-9 = Mild depression, 10-14 = Moderate depression, 15-19 = Moderately severe depression, 20-27 = Severe depression   Psychosocial Evaluation and Intervention: Psychosocial Evaluation - 09/28/18 1031      Psychosocial Evaluation & Interventions   Interventions  Encouraged to exercise with the program and follow exercise prescription    Comments  Counselor met with Mr. [facility]) as he returned to  this program today due to another stent being inserted several weeks ago.  He is a 78 year old who has a good support system with a spouse and family who are involved.  He reports other  than his heart, he is fairly healthy.  He sleeps okay with some intermittent waking and has a good appetite.  He denies a history of depression or anxiety and states he is typically in a positive mood.  His PHQ-9 scores were lower this time than when he was here previously - as well as his quality of life scores improved.  Reginald Franklin reports he is "lazy" but does have goals to complete this program and possibly be more consistent in follow up.  Staff will follow with him.    Expected Outcomes  Short:  Reginald Franklin will exercise consistently to improve his health and possibly his sleep quality.  Long:  Reginald Franklin will complete this program and hopefully develop more consistency in his exercise regimen.     Continue Psychosocial Services   Follow up required by staff       Psychosocial Re-Evaluation:   Psychosocial Discharge (Final Psychosocial Re-Evaluation):   Vocational Rehabilitation: Provide vocational rehab assistance to qualifying candidates.   Vocational Rehab Evaluation & Intervention: Vocational Rehab - 09/06/18 1354      Initial Vocational Rehab Evaluation & Intervention   Assessment shows need for Vocational Rehabilitation  No       Education: Education Goals: Education classes will be provided on a variety of topics geared toward better understanding of heart health and risk factor modification. Participant will state understanding/return demonstration of topics presented as noted by education test scores.  Learning Barriers/Preferences: Learning Barriers/Preferences - 09/06/18 1352      Learning Barriers/Preferences   Learning Barriers  Hearing    Learning Preferences  None       Education Topics:  AED/CPR: - Group verbal and written instruction with the use of models to demonstrate the basic use of the AED with the basic ABC's of resuscitation.   General Nutrition Guidelines/Fats and Fiber: -Group instruction provided by verbal, written material, models and posters to present the  general guidelines for heart healthy nutrition. Gives an explanation and review of dietary fats and fiber.   Cardiac Rehab from 10/19/2018 in [facility] Cardiac and Pulmonary Rehab  Date  10/05/18  Educator  LB  Instruction Review Code  1- Verbalizes Understanding      Controlling Sodium/Reading Food Labels: -Group verbal and written material supporting the discussion of sodium use in heart healthy nutrition. Review and explanation with models, verbal and written materials for utilization of the food label.   Cardiac Rehab from 10/19/2018 in [facility] Cardiac and Pulmonary Rehab  Date  10/07/18  Educator  LB  Instruction Review Code  1- Verbalizes Understanding      Exercise Physiology & General Exercise Guidelines: - Group verbal and written instruction with models to review the exercise physiology of the cardiovascular system and associated critical values. Provides general exercise guidelines with specific guidelines to those with heart or lung disease.    Cardiac Rehab from 10/19/2018 in [facility] Cardiac and Pulmonary Rehab  Date  10/12/18  Educator  [facility]  Instruction Review Code  1- Verbalizes Understanding      Aerobic Exercise & Resistance Training: - Gives group verbal and written instruction on the various components of exercise. Focuses on aerobic and resistive training programs and the benefits of this training and how to safely progress through these programs..   Cardiac  Rehab from 10/19/2018 in [facility] Cardiac and Pulmonary Rehab  Date  10/14/18  Educator  [place]  Instruction Review Code  1- Verbalizes Understanding      Flexibility, Balance, Mind/Body Relaxation: Provides group verbal/written instruction on the benefits of flexibility and balance training, including mind/body exercise modes such as yoga, pilates and tai chi.  Demonstration and skill practice provided.   Cardiac Rehab from 10/19/2018 in [facility] Cardiac and Pulmonary Rehab  Date  10/19/18  Educator  AS  Instruction Review Code   1- Verbalizes Understanding      Stress and Anxiety: - Provides group verbal and written instruction about the health risks of elevated stress and causes of high stress.  Discuss the correlation between heart/lung disease and anxiety and treatment options. Review healthy ways to manage with stress and anxiety.   Cardiac Rehab from 09/30/2016 in [facility] Cardiac and Pulmonary Rehab  Date  09/04/16  Educator  TS  Instruction Review Code (retired)  2- meets goals/outcomes      Depression: - Provides group verbal and written instruction on the correlation between heart/lung disease and depressed mood, treatment options, and the stigmas associated with seeking treatment.   Cardiac Rehab from 10/19/2018 in [facility] Cardiac and Pulmonary Rehab  Date  09/28/18  Educator  [facility]  Instruction Review Code  1- Verbalizes Understanding      Anatomy & Physiology of the Heart: - Group verbal and written instruction and models provide basic cardiac anatomy and physiology, with the coronary electrical and arterial systems. Review of Valvular disease and Heart Failure   Cardiac Rehab from 09/30/2016 in [facility] Cardiac and Pulmonary Rehab  Date  08/26/16  Educator  SB  Instruction Review Code (retired)  2- meets goals/outcomes      Cardiac Procedures: - Group verbal and written instruction to review commonly prescribed medications for heart disease. Reviews the medication, class of the drug, and side effects. Includes the steps to properly store meds and maintain the prescription regimen. (beta blockers and nitrates)   Cardiac Rehab from 09/30/2016 in [facility] Cardiac and Pulmonary Rehab  Date  09/02/16  Educator  SB  Instruction Review Code (retired)  2- meets goals/outcomes      Cardiac Medications I: - Group verbal and written instruction to review commonly prescribed medications for heart disease. Reviews the medication, class of the drug, and side effects. Includes the steps to properly store meds and maintain the  prescription regimen.   Cardiac Rehab from 09/30/2016 in [facility] Cardiac and Pulmonary Rehab  Date  09/11/16  Educator  TS  Instruction Review Code (retired)  2- meets goals/outcomes      Cardiac Medications II: -Group verbal and written instruction to review commonly prescribed medications for heart disease. Reviews the medication, class of the drug, and side effects. (all other drug classes)    Go Sex-Intimacy & Heart Disease, Get SMART - Goal Setting: - Group verbal and written instruction through game format to discuss heart disease and the return to sexual intimacy. Provides group verbal and written material to discuss and apply goal setting through the application of the S.M.A.R.T. Method.   Cardiac Rehab from 09/30/2016 in [facility] Cardiac and Pulmonary Rehab  Date  09/02/16  Educator  SB  Instruction Review Code (retired)  2- meets goals/outcomes      Other Matters of the Heart: - Provides group verbal, written materials and models to describe Stable Angina and Peripheral Artery. Includes description of the disease process and treatment options available to the cardiac patient.  Cardiac Rehab from 09/30/2016 in [facility] Cardiac and Pulmonary Rehab  Date  07/08/16  Educator  SB  Instruction Review Code (retired)  2- meets goals/outcomes      Exercise & Equipment Safety: - Individual verbal instruction and demonstration of equipment use and safety with use of the equipment.   Cardiac Rehab from 10/19/2018 in [facility] Cardiac and Pulmonary Rehab  Date  09/06/18  Educator  M.C  Instruction Review Code  1- Verbalizes Understanding      Infection Prevention: - Provides verbal and written material to individual with discussion of infection control including proper hand washing and proper equipment cleaning during exercise session.   Cardiac Rehab from 10/19/2018 in [facility] Cardiac and Pulmonary Rehab  Date  09/06/18  Educator  M.C  Instruction Review Code  1- Verbalizes Understanding       Falls Prevention: - Provides verbal and written material to individual with discussion of falls prevention and safety.   Cardiac Rehab from 10/19/2018 in [facility] Cardiac and Pulmonary Rehab  Date  09/06/18  Educator  M.C  Instruction Review Code  1- Verbalizes Understanding      Diabetes: - Individual verbal and written instruction to review signs/symptoms of diabetes, desired ranges of glucose level fasting, after meals and with exercise. Acknowledge that pre and post exercise glucose checks will be done for 3 sessions at entry of program.   Cardiac Rehab from 10/19/2018 in [facility] Cardiac and Pulmonary Rehab  Date  09/06/18  Educator  M.C  Instruction Review Code  1- Verbalizes Understanding      Know Your Numbers and Risk Factors: -Group verbal and written instruction about important numbers in your health.  Discussion of what are risk factors and how they play a role in the disease process.  Review of Cholesterol, Blood Pressure, Diabetes, and BMI and the role they play in your overall health.   Sleep Hygiene: -Provides group verbal and written instruction about how sleep can affect your health.  Define sleep hygiene, discuss sleep cycles and impact of sleep habits. Review good sleep hygiene tips.    Other: -Provides group and verbal instruction on various topics (see comments)   Knowledge Questionnaire Score: Knowledge Questionnaire Score - 09/06/18 1353      Knowledge Questionnaire Score   Pre Score  20/26   Correct answers reviewed with Reginald Franklin. Focus on angina, nutrition, exercise, and MI.       Core Components/Risk Factors/Patient Goals at Admission: Personal Goals and Risk Factors at Admission - 09/06/18 1355      Core Components/Risk Factors/Patient Goals on Admission    Weight Management  Weight Maintenance;Yes    Intervention  Weight Management: Develop a combined nutrition and exercise program designed to reach desired caloric intake, while maintaining appropriate  intake of nutrient and fiber, sodium and fats, and appropriate energy expenditure required for the weight goal.;Weight Management: Provide education and appropriate resources to help participant work on and attain dietary goals.    Admit Weight  165 lb 6.4 oz (75 kg)    Expected Outcomes  Short Term: Continue to assess and modify interventions until short term weight is achieved;Long Term: Adherence to nutrition and physical activity/exercise program aimed toward attainment of established weight goal;Weight Maintenance: Understanding of the daily nutrition guidelines, which includes 25-35% calories from fat, 7% or less cal from saturated fats, less than 290m cholesterol, less than 1.5gm of sodium, & 5 or more servings of fruits and vegetables daily;Understanding recommendations for meals to include 15-35% energy as protein,  25-35% energy from fat, 35-60% energy from carbohydrates, less than 264m of dietary cholesterol, 20-35 gm of total fiber daily;Understanding of distribution of calorie intake throughout the day with the consumption of 4-5 meals/snacks    Diabetes  Yes    Intervention  Provide education about signs/symptoms and action to take for hypo/hyperglycemia.;Provide education about proper nutrition, including hydration, and aerobic/resistive exercise prescription along with prescribed medications to achieve blood glucose in normal ranges: Fasting glucose 65-99 mg/dL    Expected Outcomes  Short Term: Participant verbalizes understanding of the signs/symptoms and immediate care of hyper/hypoglycemia, proper foot care and importance of medication, aerobic/resistive exercise and nutrition plan for blood glucose control.;Long Term: Attainment of HbA1C < 7%.    Heart Failure  Yes    Intervention  Provide a combined exercise and nutrition program that is supplemented with education, support and counseling about heart failure. Directed toward relieving symptoms such as shortness of breath, decreased  exercise tolerance, and extremity edema.    Expected Outcomes  Improve functional capacity of life;Short term: Attendance in program 2-3 days a week with increased exercise capacity. Reported lower sodium intake. Reported increased fruit and vegetable intake. Reports medication compliance.;Short term: Daily weights obtained and reported for increase. Utilizing diuretic protocols set by physician.;Long term: Adoption of self-care skills and reduction of barriers for early signs and symptoms recognition and intervention leading to self-care maintenance.    Hypertension  Yes    Intervention  Provide education on lifestyle modifcations including regular physical activity/exercise, weight management, moderate sodium restriction and increased consumption of fresh fruit, vegetables, and low fat dairy, alcohol moderation, and smoking cessation.;Monitor prescription use compliance.    Expected Outcomes  Short Term: Continued assessment and intervention until BP is < 140/924mHG in hypertensive participants. < 130/8073mG in hypertensive participants with diabetes, heart failure or chronic kidney disease.;Long Term: Maintenance of blood pressure at goal levels.    Lipids  Yes    Intervention  Provide education and support for participant on nutrition & aerobic/resistive exercise along with prescribed medications to achieve LDL <79m33mDL >40mg11m Expected Outcomes  Short Term: Participant states understanding of desired cholesterol values and is compliant with medications prescribed. Participant is following exercise prescription and nutrition guidelines.;Long Term: Cholesterol controlled with medications as prescribed, with individualized exercise RX and with personalized nutrition plan. Value goals: LDL < 79mg,74m > 40 mg.       Core Components/Risk Factors/Patient Goals Review:    Core Components/Risk Factors/Patient Goals at Discharge (Final Review):    ITP Comments: ITP Comments    Row Name 09/06/18  1313 09/21/18 0629 10/20/18 0942       ITP Comments  Med review completed. Initial ITP created, diagnosis can be found in [facility]. Continue with ITP unless directed changes per Medical Director review.  Starts on 09/28/2018  30 Day Review. Continue with ITP unless directed changes per Medical Director review.        Comments: 30 day review

## 2018-10-21 ENCOUNTER — Encounter: Payer: Medicare Other | Admitting: *Deleted

## 2018-10-21 DIAGNOSIS — I214 Non-ST elevation (NSTEMI) myocardial infarction: Secondary | ICD-10-CM

## 2018-10-21 DIAGNOSIS — Z955 Presence of coronary angioplasty implant and graft: Secondary | ICD-10-CM

## 2018-10-21 NOTE — Progress Notes (Signed)
Daily Session Note  Patient Details  Name: Reginald Franklin MRN: [id number] Date of Birth: Mar 15, 1941 Referring Provider:     Cardiac Rehab from 09/06/2018 in [facility] Cardiac and Pulmonary Rehab  Referring Provider  End, Harrell Gave [facility] Cardiologist: Dr. Chrissie Noa Fath]      Encounter Date: 10/21/2018  Check In: Session Check In - 10/21/18 0916      Check-In   Supervising physician immediately available to respond to emergencies  See telemetry face sheet for immediately available ER MD    Location  [facility]-Cardiac & Pulmonary Rehab    Staff Present  Jasper Loser BS, Exercise Physiologist;Carroll Enterkin, RN, BSN;Jessica Luan Pulling, MA, RCEP, CCRP, Exercise Physiologist    Medication changes reported      No    Fall or balance concerns reported     No    Warm-up and Cool-down  Performed as group-led instruction    Resistance Training Performed  Yes    VAD Patient?  No    PAD/SET Patient?  No      Pain Assessment   Currently in Pain?  No/denies          Social History   Tobacco Use  Smoking Status Never Smoker  Smokeless Tobacco Never Used    Goals Met:  Independence with exercise equipment Exercise tolerated well No report of cardiac concerns or symptoms Strength training completed today  Goals Unmet:  Not Applicable  Comments: Pt able to follow exercise prescription today without complaint.  Will continue to monitor for progression.    Dr. Emily Filbert is Medical Director for [place] and [facility].

## 2018-10-26 ENCOUNTER — Encounter: Payer: Medicare Other | Attending: Internal Medicine

## 2018-10-26 DIAGNOSIS — I214 Non-ST elevation (NSTEMI) myocardial infarction: Secondary | ICD-10-CM | POA: Diagnosis present

## 2018-10-26 DIAGNOSIS — Z955 Presence of coronary angioplasty implant and graft: Secondary | ICD-10-CM

## 2018-10-26 NOTE — Progress Notes (Signed)
Daily Session Note  Patient Details  Name: Reginald Franklin MRN: [id number] Date of Birth: 05-May-1941 Referring Provider:     Cardiac Rehab from 09/06/2018 in [facility] Cardiac and Pulmonary Rehab  Referring Provider  End, Harrell Gave [facility] Cardiologist: Dr. Chrissie Noa Fath]      Encounter Date: 10/26/2018  Check In: Session Check In - 10/26/18 0928      Check-In   Supervising physician immediately available to respond to emergencies  See telemetry face sheet for immediately available ER MD    Location  [facility]-Cardiac & Pulmonary Rehab    Staff Present  Heath Lark, RN, BSN, CCRP;Sully Dyment BS, Exercise Physiologist;Jessica Benson, MA, RCEP, CCRP, Exercise Physiologist    Medication changes reported      No    Fall or balance concerns reported     No    Tobacco Cessation  No Change    Warm-up and Cool-down  Performed as group-led instruction    Resistance Training Performed  Yes    VAD Patient?  No    PAD/SET Patient?  No      Pain Assessment   Currently in Pain?  No/denies    Multiple Pain Sites  No          Social History   Tobacco Use  Smoking Status Never Smoker  Smokeless Tobacco Never Used    Goals Met:  Independence with exercise equipment Exercise tolerated well No report of cardiac concerns or symptoms Strength training completed today  Goals Unmet:  Not Applicable  Comments: Pt able to follow exercise prescription today without complaint.  Will continue to monitor for progression.    Dr. Emily Filbert is Medical Director for [place] and [facility].

## 2018-10-28 ENCOUNTER — Encounter: Payer: Medicare Other | Admitting: *Deleted

## 2018-10-28 DIAGNOSIS — Z955 Presence of coronary angioplasty implant and graft: Secondary | ICD-10-CM

## 2018-10-28 DIAGNOSIS — I214 Non-ST elevation (NSTEMI) myocardial infarction: Secondary | ICD-10-CM

## 2018-10-28 NOTE — Progress Notes (Signed)
Daily Session Note  Patient Details  Name: Reginald Franklin MRN: [id number] Date of Birth: Jul 14, 1941 Referring Provider:     Cardiac Rehab from 09/06/2018 in [facility] Cardiac and Pulmonary Rehab  Referring Provider  End, Harrell Gave [facility] Cardiologist: Dr. Chrissie Noa Fath]      Encounter Date: 10/28/2018  Check In: Session Check In - 10/28/18 0844      Check-In   Supervising physician immediately available to respond to emergencies  See telemetry face sheet for immediately available ER MD    Location  [facility]-Cardiac & Pulmonary Rehab    Staff Present  Jasper Loser BS, Exercise Physiologist;Carroll Enterkin, RN, BSN;Catalino Plascencia Luan Pulling, MA, RCEP, CCRP, Exercise Physiologist;Joseph Tessie Fass RCP,RRT,BSRT    Medication changes reported      No    Fall or balance concerns reported     No    Warm-up and Cool-down  Performed on first and last piece of equipment    Resistance Training Performed  Yes    VAD Patient?  No    PAD/SET Patient?  No      Pain Assessment   Currently in Pain?  No/denies          Social History   Tobacco Use  Smoking Status Never Smoker  Smokeless Tobacco Never Used    Goals Met:  Independence with exercise equipment Exercise tolerated well Personal goals reviewed No report of cardiac concerns or symptoms Strength training completed today  Goals Unmet:  Not Applicable  Comments: Pt able to follow exercise prescription today without complaint.  Will continue to monitor for progression.    Dr. Emily Filbert is Medical Director for [place] and [facility].

## 2018-11-02 ENCOUNTER — Encounter: Payer: Medicare Other | Admitting: *Deleted

## 2018-11-02 DIAGNOSIS — I214 Non-ST elevation (NSTEMI) myocardial infarction: Secondary | ICD-10-CM | POA: Diagnosis not present

## 2018-11-02 DIAGNOSIS — Z955 Presence of coronary angioplasty implant and graft: Secondary | ICD-10-CM

## 2018-11-02 NOTE — Progress Notes (Signed)
Daily Session Note  Patient Details  Name: Reginald Franklin MRN: [id number] Date of Birth: 1941-04-24 Referring Provider:     Cardiac Rehab from 09/06/2018 in [facility] Cardiac and Pulmonary Rehab  Referring Provider  End, Harrell Gave [facility] Cardiologist: Dr. Chrissie Noa Fath]      Encounter Date: 11/02/2018  Check In: Session Check In - 11/02/18 0846      Check-In   Supervising physician immediately available to respond to emergencies  See telemetry face sheet for immediately available ER MD    Location  [facility]-Cardiac & Pulmonary Rehab    Staff Present  Heath Lark, RN, BSN, CCRP;Jeanna Durrell BS, Exercise Physiologist;Jessica [place], MA, RCEP, CCRP, Exercise Physiologist    Medication changes reported      No    Fall or balance concerns reported     No    Warm-up and Cool-down  Performed as group-led instruction    Resistance Training Performed  Yes    VAD Patient?  No    PAD/SET Patient?  No      Pain Assessment   Currently in Pain?  No/denies          Social History   Tobacco Use  Smoking Status Never Smoker  Smokeless Tobacco Never Used    Goals Met:  Independence with exercise equipment Exercise tolerated well No report of cardiac concerns or symptoms Strength training completed today  Goals Unmet:  Not Applicable  Comments: Pt able to follow exercise prescription today without complaint.  Will continue to monitor for progression.    Dr. Emily Filbert is Medical Director for [place] and [facility].

## 2018-11-04 ENCOUNTER — Encounter: Payer: Medicare Other | Admitting: *Deleted

## 2018-11-04 DIAGNOSIS — Z955 Presence of coronary angioplasty implant and graft: Secondary | ICD-10-CM

## 2018-11-04 DIAGNOSIS — I214 Non-ST elevation (NSTEMI) myocardial infarction: Secondary | ICD-10-CM

## 2018-11-04 NOTE — Progress Notes (Signed)
Daily Session Note  Patient Details  Name: Reginald Franklin MRN: [id number] Date of Birth: April 21, 1941 Referring Provider:     Cardiac Rehab from 09/06/2018 in [facility] Cardiac and Pulmonary Rehab  Referring Provider  End, Harrell Gave [facility] Cardiologist: Dr. Chrissie Noa Fath]      Encounter Date: 11/04/2018  Check In: Session Check In - 11/04/18 0912      Check-In   Supervising physician immediately available to respond to emergencies  See telemetry face sheet for immediately available ER MD    Location  [facility]-Cardiac & Pulmonary Rehab    Staff Present  Jasper Loser BS, Exercise Physiologist;Carroll Enterkin, RN, BSN;Joseph Hood RCP,RRT,BSRT;Axie Hayne [place], MA, RCEP, CCRP, Exercise Physiologist    Medication changes reported      No    Fall or balance concerns reported     No    Warm-up and Cool-down  Performed as group-led instruction    Resistance Training Performed  Yes    VAD Patient?  No    PAD/SET Patient?  No      Pain Assessment   Currently in Pain?  No/denies          Social History   Tobacco Use  Smoking Status Never Smoker  Smokeless Tobacco Never Used    Goals Met:  Independence with exercise equipment Exercise tolerated well No report of cardiac concerns or symptoms Strength training completed today  Goals Unmet:  Not Applicable  Comments: Pt able to follow exercise prescription today without complaint.  Will continue to monitor for progression.    Dr. Emily Filbert is Medical Director for [place] and [facility].

## 2018-11-09 DIAGNOSIS — I214 Non-ST elevation (NSTEMI) myocardial infarction: Secondary | ICD-10-CM

## 2018-11-09 DIAGNOSIS — Z955 Presence of coronary angioplasty implant and graft: Secondary | ICD-10-CM

## 2018-11-09 NOTE — Progress Notes (Signed)
Daily Session Note  Patient Details  Name: Reginald Franklin MRN: [id number] Date of Birth: 1941/04/26 Referring Provider:     Cardiac Rehab from 09/06/2018 in [facility] Cardiac and Pulmonary Rehab  Referring Provider  End, Harrell Gave [facility] Cardiologist: Dr. Chrissie Noa Fath]      Encounter Date: 11/09/2018  Check In: Session Check In - 11/09/18 0935      Check-In   Supervising physician immediately available to respond to emergencies  See telemetry face sheet for immediately available ER MD    Location  [facility]-Cardiac & Pulmonary Rehab    Staff Present  Heath Lark, RN, BSN, CCRP;Manly Nestle BS, Exercise Physiologist;Amanda Oletta Darter, BA, ACSM CEP, Exercise Physiologist    Medication changes reported      No    Fall or balance concerns reported     No    Tobacco Cessation  No Change    Warm-up and Cool-down  Performed as group-led instruction    Resistance Training Performed  Yes    VAD Patient?  No    PAD/SET Patient?  No      Pain Assessment   Currently in Pain?  No/denies    Multiple Pain Sites  No          Social History   Tobacco Use  Smoking Status Never Smoker  Smokeless Tobacco Never Used    Goals Met:  Independence with exercise equipment Exercise tolerated well No report of cardiac concerns or symptoms Strength training completed today  Goals Unmet:  Not Applicable  Comments: Pt able to follow exercise prescription today without complaint.  Will continue to monitor for progression.    Dr. Emily Filbert is Medical Director for [place] and [facility].

## 2018-11-17 ENCOUNTER — Encounter: Payer: Self-pay | Admitting: *Deleted

## 2018-11-17 DIAGNOSIS — Z955 Presence of coronary angioplasty implant and graft: Secondary | ICD-10-CM

## 2018-11-17 DIAGNOSIS — I214 Non-ST elevation (NSTEMI) myocardial infarction: Secondary | ICD-10-CM

## 2018-11-17 NOTE — Progress Notes (Signed)
Cardiac Individual Treatment Plan  Patient Details  Name: Reginald Franklin MRN: [id number] Date of Birth: 1940/12/19 Referring Provider:     Cardiac Rehab from 09/06/2018 in [facility] Cardiac and Pulmonary Rehab  Referring Provider  End, Harrell Gave [facility] Cardiologist: Dr. Chrissie Noa Fath]      Initial Encounter Date:    Cardiac Rehab from 09/06/2018 in [facility] Cardiac and Pulmonary Rehab  Date  09/06/18      Visit Diagnosis: NSTEMI (non-ST elevated myocardial infarction) [facility])  Status post coronary artery stent placement  Patient's Home Medications on Admission:  Current Outpatient Medications:  .  amLODipine (NORVASC) 5 MG tablet, Take 5 mg by mouth daily., Disp: , Rfl: 11 .  aspirin 81 MG chewable tablet, Chew 1 tablet (81 mg total) by mouth daily., Disp: 30 tablet, Rfl: 2 .  atorvastatin (LIPITOR) 80 MG tablet, Take 1 tablet (80 mg total) by mouth daily. (Patient taking differently: Take 20 mg by mouth daily. ), Disp: 30 tablet, Rfl: 1 .  carvedilol (COREG) 6.25 MG tablet, Take 6.25 mg by mouth 2 (two) times daily with a meal., Disp: , Rfl:  .  cetirizine (ZYRTEC) 10 MG tablet, Take 10 mg by mouth at bedtime., Disp: , Rfl:  .  Cholecalciferol 25 MCG (1000 UT) tablet, Take 1,000 Units by mouth daily. , Disp: , Rfl:  .  clopidogrel (PLAVIX) 75 MG tablet, Take 1 tablet (75 mg total) by mouth daily with breakfast., Disp: 30 tablet, Rfl: 11 .  ezetimibe (ZETIA) 10 MG tablet, Take 10 mg by mouth at bedtime., Disp: , Rfl:  .  fluticasone (FLONASE) 50 MCG/ACT nasal spray, Place 2 sprays into both nostrils daily as needed for rhinitis. , Disp: , Rfl:  .  furosemide (LASIX) 20 MG tablet, Take 1 tablet (20 mg total) by mouth daily., Disp: 30 tablet, Rfl: 1 .  isosorbide mononitrate (IMDUR) 30 MG 24 hr tablet, Take 30 mg by mouth daily., Disp: , Rfl: 11 .  losartan (COZAAR) 50 MG tablet, Take 1 tablet (50 mg total) by mouth daily., Disp: 30 tablet, Rfl: 11 .  Lutein 20 MG TABS, Take 1 tablet by  mouth daily. , Disp: , Rfl:  .  metFORMIN (GLUCOPHAGE) 1000 MG tablet, Take 1,000 mg by mouth 2 (two) times daily., Disp: , Rfl: 10 .  Multiple Vitamin (MULTIVITAMIN) tablet, Take 1 tablet by mouth daily., Disp: , Rfl:  .  Multiple Vitamins-Minerals (VISION FORMULA EYE HEALTH PO), Take 1 tablet by mouth daily., Disp: , Rfl:   Past Medical History: Past Medical History:  Diagnosis Date  . Anginal pain ([place])   . BPH (benign prostatic hyperplasia)   . CHF (congestive heart failure) ([place])   . Coronary artery disease   . Diabetes mellitus without complication [facility])    Patient takes Metformin.  . Erosive gastropathy   . Gastritis   . Hemorrhoids   . Hiatal hernia   . Hyperlipidemia   . Hypertension   . Nasal polyp   . Peripheral vascular disease ([place])   . S/P CABG x 3   . Seasonal allergies   . Stroke [facility])     Tobacco Use: Social History   Tobacco Use  Smoking Status Never Smoker  Smokeless Tobacco Never Used    Labs: Recent Review Flowsheet Data    There is no flowsheet data to display.       Exercise Target Goals: Exercise Program Goal: Individual exercise prescription set using results from initial 6 min walk test and  THRR while considering  patient's activity barriers and safety.   Exercise Prescription Goal: Initial exercise prescription builds to 30-45 minutes a day of aerobic activity, 2-3 days per week.  Home exercise guidelines will be given to patient during program as part of exercise prescription that the participant will acknowledge.  Activity Barriers & Risk Stratification: Activity Barriers & Cardiac Risk Stratification - 09/06/18 1331      Activity Barriers & Cardiac Risk Stratification   Activity Barriers  Joint Problems;Deconditioning;Muscular Weakness    Cardiac Risk Stratification  High       6 Minute Walk: 6 Minute Walk    Row Name 09/06/18 1352         6 Minute Walk   Phase  Initial     Distance  1088 feet     Walk Time  6 minutes      # of Rest Breaks  0     MPH  2.06     METS  2.41     RPE  13     VO2 Peak  8.44     Symptoms  Yes (comment)     Comments  shuffles feet while walking     Resting HR  66 bpm     Resting BP  144/76     Resting Oxygen Saturation   98 %     Exercise Oxygen Saturation  during 6 min walk  96 %     Max Ex. HR  105 bpm     Max Ex. BP  134/58     2 Minute Post BP  132/70        Oxygen Initial Assessment:   Oxygen Re-Evaluation:   Oxygen Discharge (Final Oxygen Re-Evaluation):   Initial Exercise Prescription: Initial Exercise Prescription - 09/06/18 1300      Date of Initial Exercise RX and Referring Provider   Date  09/06/18    Referring Provider  End, Harrell Gave [facility]   Primary Cardiologist: Dr. Bartholome Bill     Treadmill   MPH  1.6    Grade  0.5    Minutes  15    METs  2.34      NuStep   Level  2    SPM  80    Minutes  15    METs  2      Biostep-RELP   Level  2    SPM  50    Minutes  15    METs  2      Prescription Details   Frequency (times per week)  3    Duration  Progress to 30 minutes of continuous aerobic without signs/symptoms of physical distress      Intensity   THRR 40-80% of Max Heartrate  97-128    Ratings of Perceived Exertion  11-13    Perceived Dyspnea  0-4      Progression   Progression  Continue to progress workloads to maintain intensity without signs/symptoms of physical distress.      Resistance Training   Training Prescription  Yes    Weight  3 lbs    Reps  10-15       Perform Capillary Blood Glucose checks as needed.  Exercise Prescription Changes: Exercise Prescription Changes    Row Name 09/06/18 1300 09/28/18 1400 10/12/18 1400 10/26/18 1400 11/10/18 0900     Response to Exercise   Blood Pressure (Admit)  144/76  122/70  132/70  124/62  114/64   Blood Pressure (Exercise)  134/58  150/98  124/70  142/60  128/68   Blood Pressure (Exit)  132/70  128/62  112/78  126/64  106/88   Heart Rate (Admit)  66 bpm  90 bpm  74 bpm   88 bpm  85 bpm   Heart Rate (Exercise)  105 bpm  108 bpm  110 bpm  103 bpm  97 bpm   Heart Rate (Exit)  75 bpm  85 bpm  82 bpm  73 bpm  86 bpm   Oxygen Saturation (Admit)  98 %  -  -  -  -   Oxygen Saturation (Exercise)  96 %  -  -  -  -   Rating of Perceived Exertion (Exercise)  _0 Symptoms  shuffles feet when walking  shuffles feet when walking  none  none  none   Comments  -  first full day of exercise  -  -  -   Duration  -  Continue with 30 min of aerobic exercise without signs/symptoms of physical distress.  Continue with 30 min of aerobic exercise without signs/symptoms of physical distress.  Continue with 30 min of aerobic exercise without signs/symptoms of physical distress.  Continue with 30 min of aerobic exercise without signs/symptoms of physical distress.   Intensity  -  THRR unchanged  THRR unchanged  THRR unchanged  THRR unchanged     Progression   Progression  -  Continue to progress workloads to maintain intensity without signs/symptoms of physical distress.  Continue to progress workloads to maintain intensity without signs/symptoms of physical distress.  Continue to progress workloads to maintain intensity without signs/symptoms of physical distress.  Continue to progress workloads to maintain intensity without signs/symptoms of physical distress.   Average METs  -  2.52  2.31  2.34  2.97     Resistance Training   Training Prescription  -  Yes  Yes  Yes  Yes   Weight  -  3 lbs  4 lbs  4 lbs  4 lbs   Reps  -  10-15  10-15  10-15  10-15     Interval Training   Interval Training  -  No  No  No  No     Treadmill   MPH  -  1.6  1.6  1.6  1.7   Grade  -  0.5  0.5  0.5  0.5   Minutes  -  _1 METs  -  2.34  2.34  2.34  2.42     NuStep   Level  -  _2 Minutes  -  _3 METs  -  2.7  2.6  2.7  2.5     Biostep-RELP   Level  -  -  _4 Minutes  -  -  _5 METs  -  -  _6 Home Exercise Plan   Plans  to continue exercise at  -  -  Home (comment) treadmill  Home (comment) treadmill  Home (comment) treadmill   Frequency  -  -  Add 3 additional days to program exercise sessions.  Add 3 additional days to program exercise sessions.  Add 3 additional days to program exercise sessions.  Initial Home Exercises Provided  -  -  10/05/18  10/05/18  10/05/18      Exercise Comments: Exercise Comments    Row Name 09/28/18 [phone]           Exercise Comments  First full day of exercise!  Patient was oriented to gym and equipment including functions, settings, policies, and procedures.  Patient's individual exercise prescription and treatment plan were reviewed.  All starting workloads were established based on the results of the 6 minute walk test done at initial orientation visit.  The plan for exercise progression was also introduced and progression will be customized based on patient's performance and goals.          Exercise Goals and Review: Exercise Goals    Row Name 09/06/18 1359             Exercise Goals   Increase Physical Activity  Yes       Intervention  Provide advice, education, support and counseling about physical activity/exercise needs.;Develop an individualized exercise prescription for aerobic and resistive training based on initial evaluation findings, risk stratification, comorbidities and participant's personal goals.       Expected Outcomes  Short Term: Attend rehab on a regular basis to increase amount of physical activity.;Long Term: Add in home exercise to make exercise part of routine and to increase amount of physical activity.;Long Term: Exercising regularly at least 3-5 days a week.       Increase Strength and Stamina  Yes       Intervention  Provide advice, education, support and counseling about physical activity/exercise needs.;Develop an individualized exercise prescription for aerobic and resistive training based on initial evaluation findings, risk  stratification, comorbidities and participant's personal goals.       Expected Outcomes  Short Term: Increase workloads from initial exercise prescription for resistance, speed, and METs.;Short Term: Perform resistance training exercises routinely during rehab and add in resistance training at home;Long Term: Improve cardiorespiratory fitness, muscular endurance and strength as measured by increased METs and functional capacity (6MWT)       Able to understand and use rate of perceived exertion (RPE) scale  Yes       Intervention  Provide education and explanation on how to use RPE scale       Expected Outcomes  Short Term: Able to use RPE daily in rehab to express subjective intensity level;Long Term:  Able to use RPE to guide intensity level when exercising independently       Knowledge and understanding of Target Heart Rate Range (THRR)  Yes       Intervention  Provide education and explanation of THRR including how the numbers were predicted and where they are located for reference       Expected Outcomes  Short Term: Able to state/look up THRR;Short Term: Able to use daily as guideline for intensity in rehab;Long Term: Able to use THRR to govern intensity when exercising independently       Able to check pulse independently  Yes       Intervention  Provide education and demonstration on how to check pulse in carotid and radial arteries.;Review the importance of being able to check your own pulse for safety during independent exercise       Expected Outcomes  Short Term: Able to explain why pulse checking is important during independent exercise;Long Term: Able to check pulse independently and accurately       Understanding of Exercise Prescription  Yes  Intervention  Provide education, explanation, and written materials on patient's individual exercise prescription       Expected Outcomes  Short Term: Able to explain program exercise prescription;Long Term: Able to explain home exercise  prescription to exercise independently          Exercise Goals Re-Evaluation : Exercise Goals Re-Evaluation    Row Name 09/28/18 0925 10/12/18 1406 10/26/18 1358 10/28/18 0953 11/10/18 0934     Exercise Goal Re-Evaluation   Exercise Goals Review  Increase Physical Activity;Increase Strength and Stamina;Able to understand and use rate of perceived exertion (RPE) scale;Knowledge and understanding of Target Heart Rate Range (THRR);Understanding of Exercise Prescription  Increase Physical Activity;Increase Strength and Stamina;Understanding of Exercise Prescription  Increase Physical Activity;Increase Strength and Stamina;Understanding of Exercise Prescription  Increase Physical Activity;Increase Strength and Stamina;Understanding of Exercise Prescription  Increase Physical Activity;Increase Strength and Stamina;Understanding of Exercise Prescription   Comments  Reviewed RPE scale, THR and program prescription with pt today.  Pt voiced understanding and was given a copy of goals to take home.   Obie Dredge has been doing well in rehab.  He went over home exercise guidelines last week and plans to use his treadmill at home.  He is up to level 3 on the BioStep.  We will continue to monitor his progress.   Obie Dredge continues to do well in rehab.  Today, we noted that he had some pauses in his EKG rhythm that were asymptomatic.  These were printed and sent to cardiologist.   He is now using 4 lbs weights and on level 4 on the NuStep.  We will continue to monitor his progress.   Obie Dredge is doing well in rehab.  He is doing some exercise at home.  He is trying to get out to mailbox twice a day and some calesthenics.  He is not a fan of exercise and would rather just sit down.  We talked about increasing his exercise time of walking to mail box. We also talked about doing chair exercises for cardio.  He is still out of rhythm but has not heard from doctor.  He has noticed that his strength and stamina is starting to recover.  With  his rhythm he is still getting SOB.  Obie Dredge continues to do well in rehab.  He is nearing graduation already and palns to walk at home for exercise.  He has worked his way up to level 4 on the NuStep and we expect him to improve his post 6MWT.  We will continue to monitor his progress.   Expected Outcomes  Short: Use RPE daily to regulate intensity. Long: Follow program prescription in THR.  Short: Increase workloads.  Long: Continue to exercise more at home.   Short: Increase treadmill.  Long: Continue to increase strength and stamina.   Short: Try to get more exercise at home.  Long: Continue to increase strength and stamina.   Short: Improve post 6MWT  Long: Continue to exercise independently      Discharge Exercise Prescription (Final Exercise Prescription Changes): Exercise Prescription Changes - 11/10/18 0900      Response to Exercise   Blood Pressure (Admit)  114/64    Blood Pressure (Exercise)  128/68    Blood Pressure (Exit)  106/88    Heart Rate (Admit)  85 bpm    Heart Rate (Exercise)  97 bpm    Heart Rate (Exit)  86 bpm    Rating of Perceived Exertion (Exercise)  12    Symptoms  none    Duration  Continue with 30 min of aerobic exercise without signs/symptoms of physical distress.    Intensity  THRR unchanged      Progression   Progression  Continue to progress workloads to maintain intensity without signs/symptoms of physical distress.    Average METs  2.97      Resistance Training   Training Prescription  Yes    Weight  4 lbs    Reps  10-15      Interval Training   Interval Training  No      Treadmill   MPH  1.7    Grade  0.5    Minutes  15    METs  2.42      NuStep   Level  4    Minutes  15    METs  2.5      Biostep-RELP   Level  4    Minutes  15    METs  2      Home Exercise Plan   Plans to continue exercise at  Home (comment)   treadmill   Frequency  Add 3 additional days to program exercise sessions.    Initial Home Exercises Provided  10/05/18        Nutrition:  Target Goals: Understanding of nutrition guidelines, daily intake of sodium <1549m, cholesterol <2065m calories 30% from fat and 7% or less from saturated fats, daily to have 5 or more servings of fruits and vegetables.  Biometrics: Pre Biometrics - 09/06/18 1359      Pre Biometrics   Height  5' 8.5" (1.74 m)    Weight  165 lb 6.4 oz (75 kg)    Waist Circumference  36.5 inches    Hip Circumference  36.5 inches    Waist to Hip Ratio  1 %    BMI (Calculated)  24.78    Single Leg Stand  30 seconds        Nutrition Therapy Plan and Nutrition Goals: Nutrition Therapy & Goals - 09/28/18 1018      Nutrition Therapy   RD appointment deferred  Yes   he has been through this program before and has had prior nutrition education      Nutrition Assessments: Nutrition Assessments - 09/06/18 1352      MEDFICTS Scores   Pre Score  29       Nutrition Goals Re-Evaluation: Nutrition Goals Re-Evaluation    [place] 10/28/18 1007             Goals   Nutrition Goal  Heart healthy diet, order healthy, watch sodium intake       Comment  [name] and his wife like to go out to eat a lot.  He and his wife do read labels, but he knows he could do better with reducing his sodium intake.  He does try to order healthy on occasion.  He is trying eat more chicken and less burgers.  He is trying to get fruits and vegetable. He usally gets at least one fruit  a day so we talked about increasing to 6-8 servings a day.  He has his fruit with his oatmeal in the morning. He tries to drink enough but still needs some work.       Expected Outcome  Short: Increase fruits and vegetables each day.  Long: Continue to try to eat better and watch sodium.           Nutrition Goals Discharge (Final Nutrition Goals  Re-Evaluation): Nutrition Goals Re-Evaluation - 10/28/18 1007      Goals   Nutrition Goal  Heart healthy diet, order healthy, watch sodium intake    Comment  [name] and his wife like to  go out to eat a lot.  He and his wife do read labels, but he knows he could do better with reducing his sodium intake.  He does try to order healthy on occasion.  He is trying eat more chicken and less burgers.  He is trying to get fruits and vegetable. He usally gets at least one fruit  a day so we talked about increasing to 6-8 servings a day.  He has his fruit with his oatmeal in the morning. He tries to drink enough but still needs some work.    Expected Outcome  Short: Increase fruits and vegetables each day.  Long: Continue to try to eat better and watch sodium.        Psychosocial: Target Goals: Acknowledge presence or absence of significant depression and/or stress, maximize coping skills, provide positive support system. Participant is able to verbalize types and ability to use techniques and skills needed for reducing stress and depression.   Initial Review & Psychosocial Screening: Initial Psych Review & Screening - 09/06/18 1336      Initial Review   Current issues with  Current Stress Concerns    Source of Stress Concerns  Unable to perform yard/household activities;Unable to participate in former interests or hobbies      [place]?  Yes   Spouse     Barriers   Psychosocial barriers to participate in program  The patient should benefit from training in stress management and relaxation.      Screening Interventions   Interventions  Encouraged to exercise    Expected Outcomes  Long Term Goal: Stressors or current issues are controlled or eliminated.;Short Term goal: Identification and review with participant of any Quality of Life or Depression concerns found by scoring the questionnaire.;Long Term goal: The participant improves quality of Life and PHQ9 Scores as seen by post scores and/or verbalization of changes;Short Term goal: Utilizing psychosocial counselor, staff and physician to assist with identification of specific Stressors or current issues  interfering with healing process. Setting desired goal for each stressor or current issue identified.       Quality of Life Scores:  Quality of Life - 09/06/18 1341      Quality of Life   Select  Quality of Life      Quality of Life Scores   Health/Function Pre  28.71 %    Socioeconomic Pre  29 %    Psych/Spiritual Pre  30 %    Family Pre  28.5 %    GLOBAL Pre  29.03 %      Scores of 19 and below usually indicate a poorer quality of life in these areas.  A difference of  2-3 points is a clinically meaningful difference.  A difference of 2-3 points in the total score of the Quality of Life Index has been associated with significant improvement in overall quality of life, self-image, physical symptoms, and general health in studies assessing change in quality of life.  PHQ-9: Recent Review Flowsheet Data    Depression screen [facility] 2/9 09/06/2018 09/11/2016 06/30/2016   Decreased Interest 0 2 0   Down, Depressed, Hopeless 0 2 0   PHQ - 2 Score 0 4 0   Altered sleeping 1 1 1  Tired, decreased energy _0 Change in appetite 0 1 0   Feeling bad or failure about yourself  0 0 0   Trouble concentrating 0 1 0   Moving slowly or fidgety/restless 0 2 0   Suicidal thoughts 0 0 0   PHQ-9 Score _1 Difficult doing work/chores Not difficult at all Not difficult at all Not difficult at all     Interpretation of Total Score  Total Score Depression Severity:  1-4 = Minimal depression, 5-9 = Mild depression, 10-14 = Moderate depression, 15-19 = Moderately severe depression, 20-27 = Severe depression   Psychosocial Evaluation and Intervention: Psychosocial Evaluation - 09/28/18 1031      Psychosocial Evaluation & Interventions   Interventions  Encouraged to exercise with the program and follow exercise prescription    Comments  Counselor met with Mr. Purdy [facility]) as he returned to this program today due to another stent being inserted several weeks ago.  He is a 78 year old who has a  good support system with a spouse and family who are involved.  He reports other than his heart, he is fairly healthy.  He sleeps okay with some intermittent waking and has a good appetite.  He denies a history of depression or anxiety and states he is typically in a positive mood.  His PHQ-9 scores were lower this time than when he was here previously - as well as his quality of life scores improved.  [name] reports he is "lazy" but does have goals to complete this program and possibly be more consistent in follow up.  Staff will follow with him.    Expected Outcomes  Short:  Obie Dredge will exercise consistently to improve his health and possibly his sleep quality.  Long:  Obie Dredge will complete this program and hopefully develop more consistency in his exercise regimen.     Continue Psychosocial Services   Follow up required by staff       Psychosocial Re-Evaluation: Psychosocial Re-Evaluation    [place] Name 10/28/18 [phone]             Psychosocial Re-Evaluation   Current issues with  Current Stress Concerns;Current Sleep Concerns       Comments  Obie Dredge is doing well in rehab.  He is staying positive overall.  His sleep pattern is still needing some help.  He has some days that he can go right to bed at night and others are a little harder.  He is also napping in his chair as he can't stay awake.  We talked about getting up more to get in his exercise.        Expected Outcomes  Short: Try to get up more frequently.  Long: Continue to stay postive overall.        Interventions  Encouraged to attend Cardiac Rehabilitation for the exercise       Continue Psychosocial Services   Follow up required by staff          Psychosocial Discharge (Final Psychosocial Re-Evaluation): Psychosocial Re-Evaluation - 10/28/18 0959      Psychosocial Re-Evaluation   Current issues with  Current Stress Concerns;Current Sleep Concerns    Comments  Obie Dredge is doing well in rehab.  He is staying positive overall.  His sleep pattern is  still needing some help.  He has some days that he can go right to bed at night and others are a little harder.  He is also napping in  his chair as he can't stay awake.  We talked about getting up more to get in his exercise.     Expected Outcomes  Short: Try to get up more frequently.  Long: Continue to stay postive overall.     Interventions  Encouraged to attend Cardiac Rehabilitation for the exercise    Continue Psychosocial Services   Follow up required by staff       Vocational Rehabilitation: Provide vocational rehab assistance to qualifying candidates.   Vocational Rehab Evaluation & Intervention: Vocational Rehab - 09/06/18 1354      Initial Vocational Rehab Evaluation & Intervention   Assessment shows need for Vocational Rehabilitation  No       Education: Education Goals: Education classes will be provided on a variety of topics geared toward better understanding of heart health and risk factor modification. Participant will state understanding/return demonstration of topics presented as noted by education test scores.  Learning Barriers/Preferences: Learning Barriers/Preferences - 09/06/18 1352      Learning Barriers/Preferences   Learning Barriers  Hearing    Learning Preferences  None       Education Topics:  AED/CPR: - Group verbal and written instruction with the use of models to demonstrate the basic use of the AED with the basic ABC's of resuscitation.   General Nutrition Guidelines/Fats and Fiber: -Group instruction provided by verbal, written material, models and posters to present the general guidelines for heart healthy nutrition. Gives an explanation and review of dietary fats and fiber.   Cardiac Rehab from 11/09/2018 in [facility] Cardiac and Pulmonary Rehab  Date  10/05/18  Educator  LB  Instruction Review Code  1- Verbalizes Understanding      Controlling Sodium/Reading Food Labels: -Group verbal and written material supporting the discussion of  sodium use in heart healthy nutrition. Review and explanation with models, verbal and written materials for utilization of the food label.   Cardiac Rehab from 11/09/2018 in [facility] Cardiac and Pulmonary Rehab  Date  10/07/18  Educator  LB  Instruction Review Code  1- Verbalizes Understanding      Exercise Physiology & General Exercise Guidelines: - Group verbal and written instruction with models to review the exercise physiology of the cardiovascular system and associated critical values. Provides general exercise guidelines with specific guidelines to those with heart or lung disease.    Cardiac Rehab from 11/09/2018 in [facility] Cardiac and Pulmonary Rehab  Date  10/12/18  Educator  [facility]  Instruction Review Code  1- Verbalizes Understanding      Aerobic Exercise & Resistance Training: - Gives group verbal and written instruction on the various components of exercise. Focuses on aerobic and resistive training programs and the benefits of this training and how to safely progress through these programs..   Cardiac Rehab from 11/09/2018 in [facility] Cardiac and Pulmonary Rehab  Date  10/14/18  Educator  [place]  Instruction Review Code  1- Verbalizes Understanding      Flexibility, Balance, Mind/Body Relaxation: Provides group verbal/written instruction on the benefits of flexibility and balance training, including mind/body exercise modes such as yoga, pilates and tai chi.  Demonstration and skill practice provided.   Cardiac Rehab from 11/09/2018 in [facility] Cardiac and Pulmonary Rehab  Date  10/19/18  Educator  AS  Instruction Review Code  1- Verbalizes Understanding      Stress and Anxiety: - Provides group verbal and written instruction about the health risks of elevated stress and causes of high stress.  Discuss the correlation between heart/lung  disease and anxiety and treatment options. Review healthy ways to manage with stress and anxiety.   Cardiac Rehab from 11/09/2018 in [facility] Cardiac and  Pulmonary Rehab  Date  11/09/18  Educator  [facility]  Instruction Review Code  1- Verbalizes Understanding      Depression: - Provides group verbal and written instruction on the correlation between heart/lung disease and depressed mood, treatment options, and the stigmas associated with seeking treatment.   Cardiac Rehab from 11/09/2018 in [facility] Cardiac and Pulmonary Rehab  Date  09/28/18  Educator  [facility]  Instruction Review Code  1- Verbalizes Understanding      Anatomy & Physiology of the Heart: - Group verbal and written instruction and models provide basic cardiac anatomy and physiology, with the coronary electrical and arterial systems. Review of Valvular disease and Heart Failure   Cardiac Rehab from 11/09/2018 in [facility] Cardiac and Pulmonary Rehab  Date  10/28/18  Educator  CE  Instruction Review Code  1- Verbalizes Understanding      Cardiac Procedures: - Group verbal and written instruction to review commonly prescribed medications for heart disease. Reviews the medication, class of the drug, and side effects. Includes the steps to properly store meds and maintain the prescription regimen. (beta blockers and nitrates)   Cardiac Rehab from 09/30/2016 in [facility] Cardiac and Pulmonary Rehab  Date  09/02/16  Educator  SB  Instruction Review Code (retired)  2- meets goals/outcomes      Cardiac Medications I: - Group verbal and written instruction to review commonly prescribed medications for heart disease. Reviews the medication, class of the drug, and side effects. Includes the steps to properly store meds and maintain the prescription regimen.   Cardiac Rehab from 11/09/2018 in [facility] Cardiac and Pulmonary Rehab  Date  11/02/18  Educator  SB  Instruction Review Code  1- Verbalizes Understanding      Cardiac Medications II: -Group verbal and written instruction to review commonly prescribed medications for heart disease. Reviews the medication, class of the drug, and side effects. (all  other drug classes)   Cardiac Rehab from 11/09/2018 in [facility] Cardiac and Pulmonary Rehab  Date  10/21/18  Educator  CE  Instruction Review Code  1- Verbalizes Understanding       Go Sex-Intimacy & Heart Disease, Get SMART - Goal Setting: - Group verbal and written instruction through game format to discuss heart disease and the return to sexual intimacy. Provides group verbal and written material to discuss and apply goal setting through the application of the S.M.A.R.T. Method.   Cardiac Rehab from 09/30/2016 in [facility] Cardiac and Pulmonary Rehab  Date  09/02/16  Educator  SB  Instruction Review Code (retired)  2- meets goals/outcomes      Other Matters of the Heart: - Provides group verbal, written materials and models to describe Stable Angina and Peripheral Artery. Includes description of the disease process and treatment options available to the cardiac patient.   Cardiac Rehab from 11/09/2018 in [facility] Cardiac and Pulmonary Rehab  Date  10/28/18  Educator  CE  Instruction Review Code  1- Verbalizes Understanding      Exercise & Equipment Safety: - Individual verbal instruction and demonstration of equipment use and safety with use of the equipment.   Cardiac Rehab from 11/09/2018 in [facility] Cardiac and Pulmonary Rehab  Date  09/06/18  Educator  M.C  Instruction Review Code  1- Verbalizes Understanding      Infection Prevention: - Provides verbal and written material to individual  with discussion of infection control including proper hand washing and proper equipment cleaning during exercise session.   Cardiac Rehab from 11/09/2018 in [facility] Cardiac and Pulmonary Rehab  Date  09/06/18  Educator  M.C  Instruction Review Code  1- Verbalizes Understanding      Falls Prevention: - Provides verbal and written material to individual with discussion of falls prevention and safety.   Cardiac Rehab from 11/09/2018 in [facility] Cardiac and Pulmonary Rehab  Date  09/06/18  Educator  M.C   Instruction Review Code  1- Verbalizes Understanding      Diabetes: - Individual verbal and written instruction to review signs/symptoms of diabetes, desired ranges of glucose level fasting, after meals and with exercise. Acknowledge that pre and post exercise glucose checks will be done for 3 sessions at entry of program.   Cardiac Rehab from 11/09/2018 in [facility] Cardiac and Pulmonary Rehab  Date  09/06/18  Educator  M.C  Instruction Review Code  1- Verbalizes Understanding      Know Your Numbers and Risk Factors: -Group verbal and written instruction about important numbers in your health.  Discussion of what are risk factors and how they play a role in the disease process.  Review of Cholesterol, Blood Pressure, Diabetes, and BMI and the role they play in your overall health.   Cardiac Rehab from 11/09/2018 in [facility] Cardiac and Pulmonary Rehab  Date  10/21/18  Educator  CE  Instruction Review Code  1- Verbalizes Understanding      Sleep Hygiene: -Provides group verbal and written instruction about how sleep can affect your health.  Define sleep hygiene, discuss sleep cycles and impact of sleep habits. Review good sleep hygiene tips.    Cardiac Rehab from 11/09/2018 in [facility] Cardiac and Pulmonary Rehab  Date  10/26/18  Educator  [facility]  Instruction Review Code  1- Verbalizes Understanding      Other: -Provides group and verbal instruction on various topics (see comments)   Knowledge Questionnaire Score: Knowledge Questionnaire Score - 09/06/18 1353      Knowledge Questionnaire Score   Pre Score  20/26   Correct answers reviewed with [name]. Focus on angina, nutrition, exercise, and MI.       Core Components/Risk Factors/Patient Goals at Admission: Personal Goals and Risk Factors at Admission - 09/06/18 1355      Core Components/Risk Factors/Patient Goals on Admission    Weight Management  Weight Maintenance;Yes    Intervention  Weight Management: Develop a combined nutrition  and exercise program designed to reach desired caloric intake, while maintaining appropriate intake of nutrient and fiber, sodium and fats, and appropriate energy expenditure required for the weight goal.;Weight Management: Provide education and appropriate resources to help participant work on and attain dietary goals.    Admit Weight  165 lb 6.4 oz (75 kg)    Expected Outcomes  Short Term: Continue to assess and modify interventions until short term weight is achieved;Long Term: Adherence to nutrition and physical activity/exercise program aimed toward attainment of established weight goal;Weight Maintenance: Understanding of the daily nutrition guidelines, which includes 25-35% calories from fat, 7% or less cal from saturated fats, less than 22m cholesterol, less than 1.5gm of sodium, & 5 or more servings of fruits and vegetables daily;Understanding recommendations for meals to include 15-35% energy as protein, 25-35% energy from fat, 35-60% energy from carbohydrates, less than 2081mof dietary cholesterol, 20-35 gm of total fiber daily;Understanding of distribution of calorie intake throughout the day with the consumption of 4-5  meals/snacks    Diabetes  Yes    Intervention  Provide education about signs/symptoms and action to take for hypo/hyperglycemia.;Provide education about proper nutrition, including hydration, and aerobic/resistive exercise prescription along with prescribed medications to achieve blood glucose in normal ranges: Fasting glucose 65-99 mg/dL    Expected Outcomes  Short Term: Participant verbalizes understanding of the signs/symptoms and immediate care of hyper/hypoglycemia, proper foot care and importance of medication, aerobic/resistive exercise and nutrition plan for blood glucose control.;Long Term: Attainment of HbA1C < 7%.    Heart Failure  Yes    Intervention  Provide a combined exercise and nutrition program that is supplemented with education, support and counseling about  heart failure. Directed toward relieving symptoms such as shortness of breath, decreased exercise tolerance, and extremity edema.    Expected Outcomes  Improve functional capacity of life;Short term: Attendance in program 2-3 days a week with increased exercise capacity. Reported lower sodium intake. Reported increased fruit and vegetable intake. Reports medication compliance.;Short term: Daily weights obtained and reported for increase. Utilizing diuretic protocols set by physician.;Long term: Adoption of self-care skills and reduction of barriers for early signs and symptoms recognition and intervention leading to self-care maintenance.    Hypertension  Yes    Intervention  Provide education on lifestyle modifcations including regular physical activity/exercise, weight management, moderate sodium restriction and increased consumption of fresh fruit, vegetables, and low fat dairy, alcohol moderation, and smoking cessation.;Monitor prescription use compliance.    Expected Outcomes  Short Term: Continued assessment and intervention until BP is < 140/64m HG in hypertensive participants. < 130/847mHG in hypertensive participants with diabetes, heart failure or chronic kidney disease.;Long Term: Maintenance of blood pressure at goal levels.    Lipids  Yes    Intervention  Provide education and support for participant on nutrition & aerobic/resistive exercise along with prescribed medications to achieve LDL <7039mHDL >82m103m  Expected Outcomes  Short Term: Participant states understanding of desired cholesterol values and is compliant with medications prescribed. Participant is following exercise prescription and nutrition guidelines.;Long Term: Cholesterol controlled with medications as prescribed, with individualized exercise RX and with personalized nutrition plan. Value goals: LDL < 70mg38mL > 40 mg.       Core Components/Risk Factors/Patient Goals Review:  Goals and Risk Factor Review    Row Name  10/28/18 1012             Core Components/Risk Factors/Patient Goals Review   Personal Goals Review  Weight Management/Obesity;Diabetes;Hypertension;Lipids       Review  [name]'s weight continues to move up and down about 1-2lbs each day.  He continues weigh daily.  He has not been checking his blood sugars.  [place] meter [place] [employer] [place]   He did check it yesterday, but that was the first time in a while.  He also has blood pressure cuff but does not check it at home.  We talked about the importance of checking both daily in order to try to catch problems before they get really bad.  He is doing well with medications and feels they are working for him.   He has a follow up appointment later this month and will talk to doctor about all of this.        Expected Outcomes  Short: Start to check blod pressures and blood sugars more frequently.  Long: Continue to work on weight loss.           Core  Components/Risk Factors/Patient Goals at Discharge (Final Review):  Goals and Risk Factor Review - 10/28/18 1012      Core Components/Risk Factors/Patient Goals Review   Personal Goals Review  Weight Management/Obesity;Diabetes;Hypertension;Lipids    Review  [name]'s weight continues to move up and down about 1-2lbs each day.  He continues weigh daily.  He has not been checking his blood sugars.  He got a meter from [place] to check it at home.   He did check it yesterday, but that was the first time in a while.  He also has blood pressure cuff but does not check it at home.  We talked about the importance of checking both daily in order to try to catch problems before they get really bad.  He is doing well with medications and feels they are working for him.   He has a follow up appointment later this month and will talk to doctor about all of this.     Expected Outcomes  Short: Start to check blod pressures and blood sugars more frequently.  Long: Continue to work on weight loss.        ITP  Comments: ITP Comments    Row Name 09/06/18 1313 09/21/18 0629 10/20/18 0942 10/26/18 1359 11/17/18 0605   ITP Comments  Med review completed. Initial ITP created, diagnosis can be found in [facility] 12/3  30 Day Review. Continue with ITP unless directed changes per Medical Director review.  Starts on 09/28/2018  30 Day Review. Continue with ITP unless directed changes per Medical Director review.  Today, we noted that he had some pauses in his EKG rhythm that were asymptomatic.  These were printed and sent to cardiologist.   30 day review. Continue with ITP unless directed changes by Medical Director chart review.      Comments:

## 2018-11-18 VITALS — Ht 68.5 in | Wt 169.0 lb

## 2018-11-18 DIAGNOSIS — I214 Non-ST elevation (NSTEMI) myocardial infarction: Secondary | ICD-10-CM | POA: Diagnosis not present

## 2018-11-18 NOTE — Progress Notes (Signed)
Daily Session Note  Patient Details  Name: OZ GAMMEL MRN: [id number] Date of Birth: [birth year] Referring Provider:     Cardiac Rehab from 09/06/2018 in [facility] Cardiac and Pulmonary Rehab  Referring Provider  End, Harrell Gave [facility] Cardiologist: Dr. Chrissie Noa Fath]      Encounter Date: 11/18/2018  Check In: Session Check In - 11/18/18 0914      Check-In   Supervising physician immediately available to respond to emergencies  See telemetry face sheet for immediately available ER MD    Location  [facility]-Cardiac & Pulmonary Rehab    Staff Present  Justin Mend RCP,RRT,BSRT;Jessica Luan Pulling, MA, RCEP, CCRP, Exercise Physiologist;Jeanna Durrell BS, Exercise Physiologist;Carroll Enterkin, RN, BSN    Medication changes reported      No    Fall or balance concerns reported     No    Warm-up and Cool-down  Performed as group-led instruction    Resistance Training Performed  Yes    VAD Patient?  No      Pain Assessment   Currently in Pain?  No/denies          Social History   Tobacco Use  Smoking Status Never Smoker  Smokeless Tobacco Never Used    Goals Met:  Independence with exercise equipment Exercise tolerated well No report of cardiac concerns or symptoms Strength training completed today  Goals Unmet:  Not Applicable  Comments: Pt able to follow exercise prescription today without complaint.  Will continue to monitor for progression.  [place] Name 09/06/18 1352 11/18/18 1027       6 Minute Walk   Phase  Initial  Discharge    Distance  1088 feet  1250 feet    Distance % Change  -  14.9 %    Distance Feet Change  -  162 ft    Walk Time  6 minutes  6 minutes    # of Rest Breaks  0  0    MPH  2.06  2.37    METS  2.41  2.64    RPE  13  13    VO2 Peak  8.44  9.25    Symptoms  Yes (comment)  Yes (comment)    Comments  shuffles feet while walking  shuffling, leg starting to hurt at end    Resting HR  66 bpm  80 bpm    Resting BP  144/76  122/64     Resting Oxygen Saturation   98 %  98 %    Exercise Oxygen Saturation  during 6 min walk  96 %  97 %    Max Ex. HR  105 bpm  110 bpm    Max Ex. BP  134/58  126/78    2 Minute Post BP  132/70  -       Dr. Emily Filbert is Medical Director for [place] and [facility].

## 2018-11-22 NOTE — Progress Notes (Signed)
Patient ID: Reginald Franklin, male    DOB: [birth year], 78 y.o.   MRN: [id number]  HPI  Reginald Franklin is a 78 y/o male with a history of CAD, DM, hyperlipidemia, HTN, stroke, PVD and chronic heart failure.   Echo report from 08/25/18 reviewed and showed an EF of 30-35% along with trivial AR and moderate Reginald.   Cardiac catheterization done 08/24/18 that showed an EF of 35% along with: severe 3 vessel coronary artery disease with occlusion of all native vessels including left main right coronary artery left anterior descending artery and left circumflex artery Patent LIMA to the LAD Previous occlusion of graft to right coronary artery Patent stent to saphenous vein graft to obtuse marginal 1 but further distal stenosis of saphenous vein graft to obtuse marginal 1 of 95%  Had successful PCI of distal SVG-OM anastomosis using Resolute Onyx 2.0 x 15 mm DES (post-dilated proximally to 3.2 mm) with 0% residual stenosis and TIMI-3 flow.  Admitted 08/24/18 due to NSTEMI and acute on chronic HF. Cardiology consult obtained. Catheterization and PCI done and he was discharged after 2 days.   He presents today for a follow-up visit with a chief complaint of minimal fatigue upon moderate exertion. He says that this has been present for months. He has associated head congestion, leg weakness and slight weight gain along with this. He denies any difficulty sleeping, dizziness, abdominal distention, palpitations, pedal edema, chest pain or shortness of breath. Says that his cardiologist told him that he didn't have heart failure and he didn't know why he needed to keep coming.   Past Medical History:  Diagnosis Date  . Anginal pain (HCC)   . BPH (benign prostatic hyperplasia)   . CHF (congestive heart failure) (HCC)   . Coronary artery disease   . Diabetes mellitus without complication [facility])    Patient takes Metformin.  . Erosive gastropathy   . Gastritis   . Hemorrhoids   . Hiatal hernia   . Hyperlipidemia    . Hypertension   . Nasal polyp   . Peripheral vascular disease (HCC)   . S/P CABG x 3   . Seasonal allergies   . Stroke [facility])    Past Surgical History:  Procedure Laterality Date  . CARDIAC CATHETERIZATION N/A 06/10/2016   Procedure: Coronary/Graft Angiography;  Surgeon: Dalia Heading, MD;  Location: [facility] INVASIVE CV LAB;  Service: Cardiovascular;  Laterality: N/A;  . CARDIAC CATHETERIZATION N/A 06/10/2016   Procedure: Coronary Stent Intervention;  Surgeon: Marcina Millard, MD;  Location: [facility] INVASIVE CV LAB;  Service: Cardiovascular;  Laterality: N/A;  . CAROTID ENDARTERECTOMY    . CORONARY ARTERY BYPASS GRAFT    . CORONARY STENT INTERVENTION N/A 08/24/2018   Procedure: CORONARY STENT INTERVENTION;  Surgeon: Yvonne Kendall, MD;  Location: [facility] INVASIVE CV LAB;  Service: Cardiovascular;  Laterality: N/A;  . HERNIA REPAIR    . LEFT HEART CATH AND CORS/GRAFTS ANGIOGRAPHY N/A 08/24/2018   Procedure: LEFT HEART CATH AND CORS/GRAFTS ANGIOGRAPHY;  Surgeon: Lamar Blinks, MD;  Location: [facility] INVASIVE CV LAB;  Service: Cardiovascular;  Laterality: N/A;  . TONSILLECTOMY     No family history on file. Social History   Tobacco Use  . Smoking status: Never Smoker  . Smokeless tobacco: Never Used  Substance Use Topics  . Alcohol use: No   Allergies  Allergen Reactions  . Codeine Nausea And Vomiting   Prior to Admission medications   Medication Sig Start Date End Date Taking? Authorizing Provider  amLODipine (NORVASC) 5 MG tablet Take 5 mg by mouth daily. 07/31/18  Yes [provider]  aspirin 81 MG chewable tablet Chew 1 tablet (81 mg total) by mouth daily. 08/27/18  Yes Enid Baas, MD  atorvastatin (LIPITOR) 80 MG tablet Take 1 tablet (80 mg total) by mouth daily. Patient taking differently: Take 20 mg by mouth daily.  08/26/18  Yes Enid Baas, MD  Cholecalciferol 25 MCG (1000 UT) tablet Take 1,000 Units by mouth daily.    Yes [provider]   clopidogrel (PLAVIX) 75 MG tablet Take 1 tablet (75 mg total) by mouth daily with breakfast. 06/11/16  Yes Fath, Darlin Priestly, MD  ezetimibe (ZETIA) 10 MG tablet Take 10 mg by mouth at bedtime.   Yes [provider]  fexofenadine (ALLEGRA) 60 MG tablet Take 60 mg by mouth daily.   Yes [provider]  fluticasone (FLONASE) 50 MCG/ACT nasal spray Place 2 sprays into both nostrils daily as needed for rhinitis.    Yes [provider]  furosemide (LASIX) 20 MG tablet Take 1 tablet (20 mg total) by mouth daily. 08/27/18  Yes Enid Baas, MD  isosorbide mononitrate (IMDUR) 30 MG 24 hr tablet Take 30 mg by mouth daily. 08/10/18  Yes [provider]  losartan (COZAAR) 50 MG tablet Take 1 tablet (50 mg total) by mouth daily. 08/26/18 08/26/19 Yes Enid Baas, MD  Lutein 20 MG TABS Take 1 tablet by mouth daily.    Yes [provider]  metFORMIN (GLUCOPHAGE) 1000 MG tablet Take 1,000 mg by mouth 2 (two) times daily. 08/16/18  Yes [provider]  Multiple Vitamin (MULTIVITAMIN) tablet Take 1 tablet by mouth daily.   Yes [provider]  Multiple Vitamins-Minerals (VISION FORMULA EYE HEALTH PO) Take 1 tablet by mouth daily.   Yes [provider]  carvedilol (COREG) 6.25 MG tablet Take 6.25 mg by mouth 2 (two) times daily with a meal.    [provider]  cetirizine (ZYRTEC) 10 MG tablet Take 10 mg by mouth at bedtime.    [provider]    Review of Systems  Constitutional: Positive for fatigue. Negative for appetite change.  HENT: Positive for congestion. Negative for postnasal drip and sore throat.   Eyes: Negative.   Respiratory: Positive for cough (dry). Negative for shortness of breath.   Cardiovascular: Negative for chest pain, palpitations and leg swelling.  Gastrointestinal: Negative for abdominal distention and abdominal pain.  Endocrine: Negative.   Genitourinary: Negative.   Musculoskeletal:  Negative for back pain and neck pain.  Skin: Negative.   Allergic/Immunologic: Negative.   Neurological: Positive for weakness (in legs). Negative for dizziness and light-headedness.  Hematological: Negative for adenopathy. Does not bruise/bleed easily.  Psychiatric/Behavioral: Negative for dysphoric mood and sleep disturbance. The patient is not nervous/anxious.    Vitals:   11/23/18 1215  BP: 135/71  Pulse: 86  Resp: 18  SpO2: 98%  Weight: 170 lb 8 oz (77.3 kg)  Height:  (1.803 m)   Wt Readings from Last 3 Encounters:  11/23/18 170 lb 8 oz (77.3 kg)  11/18/18 169 lb (76.7 kg)  09/06/18 164 lb 8 oz (74.6 kg)   Lab Results  Component Value Date   CREATININE 1.18 08/26/2018   CREATININE 1.27 (H) 08/25/2018   CREATININE 1.36 (H) 08/24/2018    Physical Exam Vitals signs and nursing note reviewed.  Constitutional:      Appearance: Normal appearance.  HENT:     Head: Normocephalic  and atraumatic.  Neck:     Musculoskeletal: Normal range of motion and neck supple.  Cardiovascular:     Rate and Rhythm: Normal rate and regular rhythm.  Pulmonary:     Effort: Pulmonary effort is normal.     Breath sounds: Normal breath sounds. No wheezing or rales.  Abdominal:     General: There is no distension.     Palpations: Abdomen is soft.  Musculoskeletal:        General: No swelling or tenderness.  Skin:    General: Skin is warm and dry.  Neurological:     General: No focal deficit present.     Mental Status: He is alert and oriented to person, place, and time.  Psychiatric:        Mood and Affect: Mood normal.        Behavior: Behavior normal.    Assessment & Plan:  1: Chronic heart failure with reduced ejection fraction- - NYHA class II - euvolemic today - weighing daily and he was reminded to call for an overnight weight gain of >2 pounds or a weekly weight gain of >5 pounds - weight up 6 pounds from last visit 3 months ago - not adding salt and has been reading  food labels. Reviewed the importance of closely following a 2000mg  sodium diet  - saw cardiology (Fath) 09/28/2018 - BNP 08/24/18 was 490.0 - participating in heart track; encouraged him to continue his exercise once heart track finished - PharmD reconciled medications with the patient - patient says that he's received his flu vaccine for this season  2: HTN- - BP looks good today - saw PCP ([place]) 11/16/2018 - BMP from 11/19/2018 reviewed and showed sodium 139, potassium 3.9, creatinine 1.3 and GFR 54   Patient did not bring his medications nor a list. Each medication was verbally reviewed with the patient and he was encouraged to bring the bottles to every visit to confirm accuracy of list.  Patient is not interested in making a return appointment at this time. Advised him that he could call back at anytime to make another appointment.

## 2018-11-23 ENCOUNTER — Encounter: Payer: Medicare Other | Attending: Internal Medicine

## 2018-11-23 ENCOUNTER — Encounter: Payer: Self-pay | Admitting: Pharmacist

## 2018-11-23 ENCOUNTER — Encounter: Payer: Self-pay | Admitting: Family

## 2018-11-23 ENCOUNTER — Ambulatory Visit: Payer: Medicare Other | Attending: Family | Admitting: Family

## 2018-11-23 VITALS — BP 135/71 | HR 86 | Resp 18 | Ht 71.0 in | Wt 170.5 lb

## 2018-11-23 DIAGNOSIS — Z7982 Long term (current) use of aspirin: Secondary | ICD-10-CM | POA: Diagnosis not present

## 2018-11-23 DIAGNOSIS — Z955 Presence of coronary angioplasty implant and graft: Secondary | ICD-10-CM | POA: Insufficient documentation

## 2018-11-23 DIAGNOSIS — Z8673 Personal history of transient ischemic attack (TIA), and cerebral infarction without residual deficits: Secondary | ICD-10-CM | POA: Insufficient documentation

## 2018-11-23 DIAGNOSIS — I110 Hypertensive heart disease with heart failure: Secondary | ICD-10-CM | POA: Diagnosis not present

## 2018-11-23 DIAGNOSIS — Z7984 Long term (current) use of oral hypoglycemic drugs: Secondary | ICD-10-CM | POA: Insufficient documentation

## 2018-11-23 DIAGNOSIS — I252 Old myocardial infarction: Secondary | ICD-10-CM | POA: Diagnosis not present

## 2018-11-23 DIAGNOSIS — E1151 Type 2 diabetes mellitus with diabetic peripheral angiopathy without gangrene: Secondary | ICD-10-CM | POA: Insufficient documentation

## 2018-11-23 DIAGNOSIS — Z951 Presence of aortocoronary bypass graft: Secondary | ICD-10-CM | POA: Insufficient documentation

## 2018-11-23 DIAGNOSIS — I5022 Chronic systolic (congestive) heart failure: Secondary | ICD-10-CM | POA: Diagnosis present

## 2018-11-23 DIAGNOSIS — Z885 Allergy status to narcotic agent status: Secondary | ICD-10-CM | POA: Insufficient documentation

## 2018-11-23 DIAGNOSIS — I214 Non-ST elevation (NSTEMI) myocardial infarction: Secondary | ICD-10-CM | POA: Diagnosis not present

## 2018-11-23 DIAGNOSIS — I2510 Atherosclerotic heart disease of native coronary artery without angina pectoris: Secondary | ICD-10-CM | POA: Diagnosis not present

## 2018-11-23 DIAGNOSIS — E785 Hyperlipidemia, unspecified: Secondary | ICD-10-CM | POA: Diagnosis not present

## 2018-11-23 DIAGNOSIS — N400 Enlarged prostate without lower urinary tract symptoms: Secondary | ICD-10-CM | POA: Diagnosis not present

## 2018-11-23 DIAGNOSIS — Z79899 Other long term (current) drug therapy: Secondary | ICD-10-CM | POA: Diagnosis not present

## 2018-11-23 DIAGNOSIS — I10 Essential (primary) hypertension: Secondary | ICD-10-CM

## 2018-11-23 NOTE — Patient Instructions (Signed)
Continue weighing daily and call for an overnight weight gain of > 2 pounds or a weekly weight gain of >5 pounds. 

## 2018-11-23 NOTE — Progress Notes (Signed)
[facility] - PHARMACIST COUNSELING NOTE  ADHERENCE ASSESSMENT  Adherence strategy: pill box   Do you ever forget to take your medication? [] Yes (1) [x] No (0)  Do you ever skip doses due to side effects? [] Yes (1) [x] No (0)  Do you have trouble affording your medicines? [] Yes (1) [x] No (0)  Are you ever unable to pick up your medication due to transportation difficulties? [] Yes (1) [x] No (0)  Do you ever stop taking your medications because you don't believe they are helping? [] Yes (1) [x] No (0)  Total score _0______    Recommendations given to patient about increasing adherence: Uses a pill box. May occasionally miss doses of metformin or Vitamin D at dinner.  Guideline-Directed Medical Therapy/Evidence Based Medicine    ACE/ARB/ARNI: losartan 50 mg daily   Beta Blocker: none   Aldosterone Antagonist: none Diuretic: furosemide 20 mg daily    SUBJECTIVE   HPI: Here for follow up visit. Says that his Cardiologist told him he does not have HF and is not sure why he comes here.  Past Medical History:  Diagnosis Date  . Anginal pain ([place])   . BPH (benign prostatic hyperplasia)   . CHF (congestive heart failure) ([place])   . Coronary artery disease   . Diabetes mellitus without complication [facility])    Patient takes Metformin.  . Erosive gastropathy   . Gastritis   . Hemorrhoids   . Hiatal hernia   . Hyperlipidemia   . Hypertension   . Nasal polyp   . Peripheral vascular disease ([place])   . S/P CABG x 3   . Seasonal allergies   . Stroke [facility])         OBJECTIVE    Vital signs: HR 86, BP 135/71, weight (pounds) 170.8  ECHO: Date 08/25/18, EF 30-35%  Cath: Date 08/24/18, EF 35%  BMP Latest Ref Rng & Units 08/26/2018 08/25/2018 08/24/2018  Glucose 70 - 99 mg/dL 135(H) 121(H) 217(H)  BUN 8 - 23 mg/dL 21 23 28(H)  Creatinine 0.61 - 1.24 mg/dL 1.18 1.27(H) 1.36(H)  Sodium 135 - 145 mmol/L 141 142 139  Potassium 3.5 - 5.1 mmol/L 3.9  3.2(L) 4.2  Chloride 98 - 111 mmol/L 105 105 102  CO2 22 - 32 mmol/L 26 27 22   Calcium 8.9 - 10.3 mg/dL 8.9 8.7(L) 9.5    ASSESSMENT 78 year old male with HFrEF. Denies increased swelling or shortness of breath. BP today 135/71. Reports carvedilol was discontinued by Cardiologist.   PLAN Patient to follow with his Cardiologist.    Time spent: 10 minutes  [place], [place].D. 11/23/2018 1:07 PM    Current Outpatient Medications:  .  amLODipine (NORVASC) 5 MG tablet, Take 5 mg by mouth daily., Disp: , Rfl: 11 .  aspirin 81 MG chewable tablet, Chew 1 tablet (81 mg total) by mouth daily., Disp: 30 tablet, Rfl: 2 .  atorvastatin (LIPITOR) 80 MG tablet, Take 1 tablet (80 mg total) by mouth daily. (Patient taking differently: Take 20 mg by mouth daily. ), Disp: 30 tablet, Rfl: 1 .  carvedilol (COREG) 6.25 MG tablet, Take 6.25 mg by mouth 2 (two) times daily with a meal., Disp: , Rfl:  .  cetirizine (ZYRTEC) 10 MG tablet, Take 10 mg by mouth at bedtime., Disp: , Rfl:  .  Cholecalciferol 25 MCG (1000 UT) tablet, Take 1,000 Units by mouth daily. , Disp: , Rfl:  .  clopidogrel (PLAVIX) 75 MG tablet, Take 1 tablet (75  mg total) by mouth daily with breakfast., Disp: 30 tablet, Rfl: 11 .  ezetimibe (ZETIA) 10 MG tablet, Take 10 mg by mouth at bedtime., Disp: , Rfl:  .  fexofenadine (ALLEGRA) 60 MG tablet, Take 60 mg by mouth daily., Disp: , Rfl:  .  fluticasone (FLONASE) 50 MCG/ACT nasal spray, Place 2 sprays into both nostrils daily as needed for rhinitis. , Disp: , Rfl:  .  furosemide (LASIX) 20 MG tablet, Take 1 tablet (20 mg total) by mouth daily., Disp: 30 tablet, Rfl: 1 .  isosorbide mononitrate (IMDUR) 30 MG 24 hr tablet, Take 30 mg by mouth daily., Disp: , Rfl: 11 .  losartan (COZAAR) 50 MG tablet, Take 1 tablet (50 mg total) by mouth daily., Disp: 30 tablet, Rfl: 11 .  Lutein 20 MG TABS, Take 1 tablet by mouth daily. , Disp: , Rfl:  .  metFORMIN (GLUCOPHAGE) 1000 MG tablet, Take  1,000 mg by mouth 2 (two) times daily., Disp: , Rfl: 10 .  Multiple Vitamin (MULTIVITAMIN) tablet, Take 1 tablet by mouth daily., Disp: , Rfl:  .  Multiple Vitamins-Minerals (VISION FORMULA EYE HEALTH PO), Take 1 tablet by mouth daily., Disp: , Rfl:    COUNSELING POINTS/CLINICAL PEARLS  Losartan (Goal: 150 mg once daily)  Warn male patient to avoid pregnancy and to report a pregnancy that occurs during therapy.  Side effects may include dizziness, upper respiratory infection, nasal congestion, and back pain.  Warn patient to avoid use of potassium supplements or potassium-containing salt substitutes unless they consult healthcare provider. Furosemide  Drug causes sun-sensitivity. Advise patient to use sunscreen and avoid tanning beds. Patient should avoid activities requiring coordination until drug effects are realized, as drug may cause dizziness, vertigo, or blurred vision. This drug may cause hyperglycemia, hyperuricemia, constipation, diarrhea, loss of appetite, nausea, vomiting, purpuric disorder, cramps, spasticity, asthenia, headache, paresthesia, or scaling eczema. Instruct patient to report unusual bleeding/bruising or signs/symptoms of hypotension, infection, pancreatitis, or ototoxicity (tinnitus, hearing impairment). Advise patient to report signs/symptoms of a severe skin reactions (flu-like symptoms, spreading red rash, or skin/mucous membrane blistering) or erythema multiforme. Instruct patient to eat high-potassium foods during drug therapy, as directed by healthcare professional.  Patient should not drink alcohol while taking this drug.   DRUGS TO AVOID IN HEART FAILURE  Drug or Class Mechanism  Analgesics . NSAIDs . COX-2 inhibitors . Glucocorticoids  Sodium and water retention, increased systemic vascular resistance, decreased response to diuretics   Diabetes Medications . Metformin . Thiazolidinediones o Rosiglitazone (Avandia) o Pioglitazone (Actos) . DPP4  Inhibitors o Saxagliptin (Onglyza) o Sitagliptin (Januvia)   Lactic acidosis Possible calcium channel blockade   Unknown  Antiarrhythmics . Class I  o Flecainide o Disopyramide . Class III o Sotalol . Other o Dronedarone  Negative inotrope, proarrhythmic   Proarrhythmic, beta blockade  Negative inotrope  Antihypertensives . Alpha Blockers o Doxazosin . Calcium Channel Blockers o Diltiazem o Verapamil o Nifedipine . Central Alpha Adrenergics o Moxonidine . Peripheral Vasodilators o Minoxidil  Increases renin and aldosterone  Negative inotrope    Possible sympathetic withdrawal  Unknown  Anti-infective . Itraconazole . Amphotericin B  Negative inotrope Unknown  Hematologic . Anagrelide . Cilostazol   Possible inhibition of PD IV Inhibition of PD III causing arrhythmias  Neurologic/Psychiatric . Stimulants . Anti-Seizure Drugs o Carbamazepine o Pregabalin . Antidepressants o Tricyclics o Citalopram . Parkinsons o Bromocriptine o Pergolide o Pramipexole . Antipsychotics o Clozapine . Antimigraine o Ergotamine o Methysergide . Appetite suppressants .  Bipolar o Lithium  Peripheral alpha and beta agonist activity  Negative inotrope and chronotrope Calcium channel blockade  Negative inotrope, proarrhythmic Dose-dependent QT prolongation  Excessive serotonin activity/valvular damage Excessive serotonin activity/valvular damage Unknown  IgE mediated hypersensitivy, calcium channel blockade  Excessive serotonin activity/valvular damage Excessive serotonin activity/valvular damage Valvular damage  Direct myofibrillar degeneration, adrenergic stimulation  Antimalarials . Chloroquine . Hydroxychloroquine Intracellular inhibition of lysosomal enzymes  Urologic Agents . Alpha Blockers o Doxazosin o Prazosin o Tamsulosin o Terazosin  Increased renin and aldosterone  Adapted from Page RL, et al. "Drugs That May Cause or Exacerbate  Heart Failure: A Scientific Statement from the American Heart  Association." Circulation 2016; 134:e32-e69. DOI: 10.1161/CIR.0000000000000426   MEDICATION ADHERENCES TIPS AND STRATEGIES 1. Taking medication as prescribed improves patient outcomes in heart failure (reduces hospitalizations, improves symptoms, increases survival) 2. Side effects of medications can be managed by decreasing doses, switching agents, stopping drugs, or adding additional therapy. Please let someone in the Heart Failure Clinic know if you have having bothersome side effects so we can modify your regimen. Do not alter your medication regimen without talking to [place].  3. Medication reminders can help patients remember to take drugs on time. If you are missing or forgetting doses you can try linking behaviors, using pill boxes, or an electronic reminder like an alarm on your phone or an app. Some people can also get automated phone calls as medication reminders.

## 2018-11-23 NOTE — Progress Notes (Signed)
Daily Session Note  Patient Details  Name: Reginald Franklin MRN: [id number] Date of Birth: 1941/06/15 Referring Provider:     Cardiac Rehab from 09/06/2018 in [facility] Cardiac and Pulmonary Rehab  Referring Provider  End, Harrell Gave [facility] Cardiologist: Dr. Chrissie Noa Fath]      Encounter Date: 11/23/2018  Check In: Session Check In - 11/23/18 0915      Check-In   Supervising physician immediately available to respond to emergencies  See telemetry face sheet for immediately available ER MD    Location  [facility]-Cardiac & Pulmonary Rehab    Staff Present  Justin Mend RCP,RRT,BSRT;Jessica Luan Pulling, MA, RCEP, CCRP, Exercise Physiologist;Susanne Bice, RN, BSN, CCRP;Jeanna Durrell BS, Exercise Physiologist    Medication changes reported      No    Fall or balance concerns reported     No    Warm-up and Cool-down  Performed as group-led instruction    Resistance Training Performed  Yes    VAD Patient?  No    PAD/SET Patient?  No      Pain Assessment   Currently in Pain?  No/denies          Social History   Tobacco Use  Smoking Status Never Smoker  Smokeless Tobacco Never Used    Goals Met:  Independence with exercise equipment Exercise tolerated well Personal goals reviewed No report of cardiac concerns or symptoms Strength training completed today  Goals Unmet:  Not Applicable  Comments: Pt able to follow exercise prescription today without complaint.  Will continue to monitor for progression.    Dr. Emily Filbert is Medical Director for [place] and [facility].

## 2018-11-25 DIAGNOSIS — I214 Non-ST elevation (NSTEMI) myocardial infarction: Secondary | ICD-10-CM | POA: Diagnosis not present

## 2018-11-25 NOTE — Progress Notes (Signed)
Daily Session Note  Patient Details  Name: Reginald Franklin MRN: [id number] Date of Birth: September 05, 1941 Referring Provider:     Cardiac Rehab from 09/06/2018 in [facility] Cardiac and Pulmonary Rehab  Referring Provider  End, Harrell Gave [facility] Cardiologist: Dr. Chrissie Noa Fath]      Encounter Date: 11/25/2018  Check In: Session Check In - 11/25/18 0904      Check-In   Supervising physician immediately available to respond to emergencies  See telemetry face sheet for immediately available ER MD    Location  [facility]-Cardiac & Pulmonary Rehab    Staff Present  Alberteen Sam, MA, RCEP, CCRP, Exercise Physiologist;Mariadel Mruk RCP,RRT,BSRT;Jeanna Durrell BS, Exercise Physiologist;Carroll [place], Therapist, sports, BSN    Medication changes reported      No    Fall or balance concerns reported     No    Warm-up and Cool-down  Performed as group-led Higher education careers adviser Performed  Yes    VAD Patient?  No    PAD/SET Patient?  No      Pain Assessment   Currently in Pain?  No/denies          Social History   Tobacco Use  Smoking Status Never Smoker  Smokeless Tobacco Never Used    Goals Met:  Independence with exercise equipment Exercise tolerated well No report of cardiac concerns or symptoms Strength training completed today  Goals Unmet:  Not Applicable  Comments: Pt able to follow exercise prescription today without complaint.  Will continue to monitor for progression.    Dr. Emily Filbert is Medical Director for [place] and [facility].

## 2018-11-30 ENCOUNTER — Encounter: Payer: Medicare Other | Admitting: *Deleted

## 2018-11-30 DIAGNOSIS — I214 Non-ST elevation (NSTEMI) myocardial infarction: Secondary | ICD-10-CM

## 2018-11-30 DIAGNOSIS — Z955 Presence of coronary angioplasty implant and graft: Secondary | ICD-10-CM

## 2018-11-30 NOTE — Progress Notes (Signed)
Daily Session Note  Patient Details  Name: Reginald Franklin MRN: [id number] Date of Birth: 1941/03/14 Referring Provider:     Cardiac Rehab from 09/06/2018 in [facility] Cardiac and Pulmonary Rehab  Referring Provider  End, Harrell Gave [facility] Cardiologist: Dr. Chrissie Noa Fath]      Encounter Date: 11/30/2018  Check In: Session Check In - 11/30/18 0913      Check-In   Supervising physician immediately available to respond to emergencies  See telemetry face sheet for immediately available ER MD    Location  [facility]-Cardiac & Pulmonary Rehab    Staff Present  Heath Lark, RN, BSN, CCRP;Jeanna Durrell BS, Exercise Physiologist;Lexine Jaspers [place], MA, RCEP, CCRP, Exercise Physiologist;Joseph [employer], BA, ACSM CEP, Exercise Physiologist    Medication changes reported      No    Fall or balance concerns reported     No    Warm-up and Cool-down  Performed as group-led instruction    Resistance Training Performed  Yes    VAD Patient?  No    PAD/SET Patient?  No      Pain Assessment   Currently in Pain?  No/denies          Social History   Tobacco Use  Smoking Status Never Smoker  Smokeless Tobacco Never Used    Goals Met:  Independence with exercise equipment Exercise tolerated well No report of cardiac concerns or symptoms Strength training completed today  Goals Unmet:  Not Applicable  Comments: Pt able to follow exercise prescription today without complaint.  Will continue to monitor for progression.    Dr. Emily Filbert is Medical Director for [place] and [facility].

## 2018-11-30 NOTE — Patient Instructions (Signed)
Discharge Patient Instructions  Patient Details  Name: Reginald Franklin MRN: [id number] Date of Birth: 1941/07/08 Referring Provider:  Teodoro Spray, MD   Number of Visits: 60  Reason for Discharge:  Patient reached a stable level of exercise. Patient independent in their exercise. Patient has met program and personal goals.  Smoking History:  Social History   Tobacco Use  Smoking Status Never Smoker  Smokeless Tobacco Never Used    Diagnosis:  NSTEMI (non-ST elevated myocardial infarction) ([place])  Status post coronary artery stent placement  Initial Exercise Prescription: Initial Exercise Prescription - 09/06/18 1300      Date of Initial Exercise RX and Referring Provider   Date  09/06/18    Referring Provider  End, Harrell Gave [facility]   Primary Cardiologist: Dr. Bartholome Bill     Treadmill   MPH  1.6    Grade  0.5    Minutes  15    METs  2.34      NuStep   Level  2    SPM  80    Minutes  15    METs  2      Biostep-RELP   Level  2    SPM  50    Minutes  15    METs  2      Prescription Details   Frequency (times per week)  3    Duration  Progress to 30 minutes of continuous aerobic without signs/symptoms of physical distress      Intensity   THRR 40-80% of Max Heartrate  97-128    Ratings of Perceived Exertion  11-13    Perceived Dyspnea  0-4      Progression   Progression  Continue to progress workloads to maintain intensity without signs/symptoms of physical distress.      Resistance Training   Training Prescription  Yes    Weight  3 lbs    Reps  10-15       Discharge Exercise Prescription (Final Exercise Prescription Changes): Exercise Prescription Changes - 11/24/18 1100      Response to Exercise   Blood Pressure (Admit)  124/56    Blood Pressure (Exercise)  134/74    Blood Pressure (Exit)  104/62    Heart Rate (Admit)  80 bpm    Heart Rate (Exercise)  98 bpm    Heart Rate (Exit)  890 bpm    Rating of Perceived Exertion (Exercise)  13     Symptoms  none    Duration  Continue with 30 min of aerobic exercise without signs/symptoms of physical distress.    Intensity  THRR unchanged      Progression   Progression  Continue to progress workloads to maintain intensity without signs/symptoms of physical distress.    Average METs  2.64      Resistance Training   Training Prescription  Yes    Weight  4 lbs    Reps  10-15      Interval Training   Interval Training  No      Treadmill   MPH  1.7    Grade  0.5    Minutes  15    METs  2.42      NuStep   Level  4    Minutes  15    METs  2.5      Biostep-RELP   Level  4    Minutes  15    METs  3      Home  Exercise Plan   Plans to continue exercise at  Home (comment)   treadmill   Frequency  Add 3 additional days to program exercise sessions.    Initial Home Exercises Provided  10/05/18       Functional Capacity: 6 Minute Walk    Row Name 09/06/18 1352 11/18/18 1027       6 Minute Walk   Phase  Initial  Discharge    Distance  1088 feet  1250 feet    Distance % Change  -  14.9 %    Distance Feet Change  -  162 ft    Walk Time  6 minutes  6 minutes    # of Rest Breaks  0  0    MPH  2.06  2.37    METS  2.41  2.64    RPE  13  13    VO2 Peak  8.44  9.25    Symptoms  Yes (comment)  Yes (comment)    Comments  shuffles feet while walking  shuffling, leg starting to hurt at end    Resting HR  66 bpm  80 bpm    Resting BP  144/76  122/64    Resting Oxygen Saturation   98 %  98 %    Exercise Oxygen Saturation  during 6 min walk  96 %  97 %    Max Ex. HR  105 bpm  110 bpm    Max Ex. BP  134/58  126/78    2 Minute Post BP  132/70  -       Quality of Life: Quality of Life - 09/06/18 1341      Quality of Life   Select  Quality of Life      Quality of Life Scores   Health/Function Pre  28.71 %    Socioeconomic Pre  29 %    Psych/Spiritual Pre  30 %    Family Pre  28.5 %    GLOBAL Pre  29.03 %       Personal Goals: Goals established at orientation  with interventions provided to work toward goal. Personal Goals and Risk Factors at Admission - 09/06/18 1355      Core Components/Risk Factors/Patient Goals on Admission    Weight Management  Weight Maintenance;Yes    Intervention  Weight Management: Develop a combined nutrition and exercise program designed to reach desired caloric intake, while maintaining appropriate intake of nutrient and fiber, sodium and fats, and appropriate energy expenditure required for the weight goal.;Weight Management: Provide education and appropriate resources to help participant work on and attain dietary goals.    Admit Weight  165 lb 6.4 oz (75 kg)    Expected Outcomes  Short Term: Continue to assess and modify interventions until short term weight is achieved;Long Term: Adherence to nutrition and physical activity/exercise program aimed toward attainment of established weight goal;Weight Maintenance: Understanding of the daily nutrition guidelines, which includes 25-35% calories from fat, 7% or less cal from saturated fats, less than 228m cholesterol, less than 1.5gm of sodium, & 5 or more servings of fruits and vegetables daily;Understanding recommendations for meals to include 15-35% energy as protein, 25-35% energy from fat, 35-60% energy from carbohydrates, less than 2054mof dietary cholesterol, 20-35 gm of total fiber daily;Understanding of distribution of calorie intake throughout the day with the consumption of 4-5 meals/snacks    Diabetes  Yes    Intervention  Provide education about signs/symptoms and action to take for  hypo/hyperglycemia.;Provide education about proper nutrition, including hydration, and aerobic/resistive exercise prescription along with prescribed medications to achieve blood glucose in normal ranges: Fasting glucose 65-99 mg/dL    Expected Outcomes  Short Term: Participant verbalizes understanding of the signs/symptoms and immediate care of hyper/hypoglycemia, proper foot care and  importance of medication, aerobic/resistive exercise and nutrition plan for blood glucose control.;Long Term: Attainment of HbA1C < 7%.    Heart Failure  Yes    Intervention  Provide a combined exercise and nutrition program that is supplemented with education, support and counseling about heart failure. Directed toward relieving symptoms such as shortness of breath, decreased exercise tolerance, and extremity edema.    Expected Outcomes  Improve functional capacity of life;Short term: Attendance in program 2-3 days a week with increased exercise capacity. Reported lower sodium intake. Reported increased fruit and vegetable intake. Reports medication compliance.;Short term: Daily weights obtained and reported for increase. Utilizing diuretic protocols set by physician.;Long term: Adoption of self-care skills and reduction of barriers for early signs and symptoms recognition and intervention leading to self-care maintenance.    Hypertension  Yes    Intervention  Provide education on lifestyle modifcations including regular physical activity/exercise, weight management, moderate sodium restriction and increased consumption of fresh fruit, vegetables, and low fat dairy, alcohol moderation, and smoking cessation.;Monitor prescription use compliance.    Expected Outcomes  Short Term: Continued assessment and intervention until BP is < 140/84m HG in hypertensive participants. < 130/841mHG in hypertensive participants with diabetes, heart failure or chronic kidney disease.;Long Term: Maintenance of blood pressure at goal levels.    Lipids  Yes    Intervention  Provide education and support for participant on nutrition & aerobic/resistive exercise along with prescribed medications to achieve LDL <7086mHDL >40m68m  Expected Outcomes  Short Term: Participant states understanding of desired cholesterol values and is compliant with medications prescribed. Participant is following exercise prescription and nutrition  guidelines.;Long Term: Cholesterol controlled with medications as prescribed, with individualized exercise RX and with personalized nutrition plan. Value goals: LDL < 70mg3mL > 40 mg.        Personal Goals Discharge: Goals and Risk Factor Review - 11/23/18 1015      Core Components/Risk Factors/Patient Goals Review   Personal Goals Review  Weight Management/Obesity;Diabetes;Hypertension;Lipids    Review  [name] wObie Dredge be graduating next week.  His weight stays fairly steady around 158 lbs at home.   He has been using his new meter up until last week.  He has just been looking at it. We talked about how inusrance may take away his coverage for supplies if he is not using it.  His blood pressures have been good in class., but not checking at home.  Talked about making sure he keeps an eye on it at home after he graduates.  He is doing well with his medications.     Expected Outcomes  Short: Use meter and cuff to check numbers at home.  Long: Continue to montior risk factors.        Exercise Goals and Review: Exercise Goals    Row Name 09/06/18 1359             Exercise Goals   Increase Physical Activity  Yes       Intervention  Provide advice, education, support and counseling about physical activity/exercise needs.;Develop an individualized exercise prescription for aerobic and resistive training based on initial evaluation findings, risk stratification, comorbidities and participant's personal goals.  Expected Outcomes  Short Term: Attend rehab on a regular basis to increase amount of physical activity.;Long Term: Add in home exercise to make exercise part of routine and to increase amount of physical activity.;Long Term: Exercising regularly at least 3-5 days a week.       Increase Strength and Stamina  Yes       Intervention  Provide advice, education, support and counseling about physical activity/exercise needs.;Develop an individualized exercise prescription for aerobic and  resistive training based on initial evaluation findings, risk stratification, comorbidities and participant's personal goals.       Expected Outcomes  Short Term: Increase workloads from initial exercise prescription for resistance, speed, and METs.;Short Term: Perform resistance training exercises routinely during rehab and add in resistance training at home;Long Term: Improve cardiorespiratory fitness, muscular endurance and strength as measured by increased METs and functional capacity (6MWT)       Able to understand and use rate of perceived exertion (RPE) scale  Yes       Intervention  Provide education and explanation on how to use RPE scale       Expected Outcomes  Short Term: Able to use RPE daily in rehab to express subjective intensity level;Long Term:  Able to use RPE to guide intensity level when exercising independently       Knowledge and understanding of Target Heart Rate Range (THRR)  Yes       Intervention  Provide education and explanation of THRR including how the numbers were predicted and where they are located for reference       Expected Outcomes  Short Term: Able to state/look up THRR;Short Term: Able to use daily as guideline for intensity in rehab;Long Term: Able to use THRR to govern intensity when exercising independently       Able to check pulse independently  Yes       Intervention  Provide education and demonstration on how to check pulse in carotid and radial arteries.;Review the importance of being able to check your own pulse for safety during independent exercise       Expected Outcomes  Short Term: Able to explain why pulse checking is important during independent exercise;Long Term: Able to check pulse independently and accurately       Understanding of Exercise Prescription  Yes       Intervention  Provide education, explanation, and written materials on patient's individual exercise prescription       Expected Outcomes  Short Term: Able to explain program exercise  prescription;Long Term: Able to explain home exercise prescription to exercise independently          Exercise Goals Re-Evaluation: Exercise Goals Re-Evaluation    Row Name 09/28/18 0925 10/12/18 1406 10/26/18 1358 10/28/18 0953 11/10/18 0934     Exercise Goal Re-Evaluation   Exercise Goals Review  Increase Physical Activity;Increase Strength and Stamina;Able to understand and use rate of perceived exertion (RPE) scale;Knowledge and understanding of Target Heart Rate Range (THRR);Understanding of Exercise Prescription  Increase Physical Activity;Increase Strength and Stamina;Understanding of Exercise Prescription  Increase Physical Activity;Increase Strength and Stamina;Understanding of Exercise Prescription  Increase Physical Activity;Increase Strength and Stamina;Understanding of Exercise Prescription  Increase Physical Activity;Increase Strength and Stamina;Understanding of Exercise Prescription   Comments  Reviewed RPE scale, THR and program prescription with pt today.  Pt voiced understanding and was given a copy of goals to take home.   Obie Dredge has been doing well in rehab.  He went over home exercise guidelines last week  and plans to use his treadmill at home.  He is up to level 3 on the BioStep.  We will continue to monitor his progress.   Obie Dredge continues to do well in rehab.  Today, we noted that he had some pauses in his EKG rhythm that were asymptomatic.  These were printed and sent to cardiologist.   He is now using 4 lbs weights and on level 4 on the NuStep.  We will continue to monitor his progress.   Obie Dredge is doing well in rehab.  He is doing some exercise at home.  He is trying to get out to mailbox twice a day and some calesthenics.  He is not a fan of exercise and would rather just sit down.  We talked about increasing his exercise time of walking to mail box. We also talked about doing chair exercises for cardio.  He is still out of rhythm but has not heard from doctor.  He has noticed that his  strength and stamina is starting to recover.  With his rhythm he is still getting SOB.  Obie Dredge continues to do well in rehab.  He is nearing graduation already and palns to walk at home for exercise.  He has worked his way up to level 4 on the NuStep and we expect him to improve his post 6MWT.  We will continue to monitor his progress.   Expected Outcomes  Short: Use RPE daily to regulate intensity. Long: Follow program prescription in THR.  Short: Increase workloads.  Long: Continue to exercise more at home.   Short: Increase treadmill.  Long: Continue to increase strength and stamina.   Short: Try to get more exercise at home.  Long: Continue to increase strength and stamina.   Short: Improve post 6MWT  Long: Continue to exercise independently   Row Name 11/23/18 1012             Exercise Goal Re-Evaluation   Exercise Goals Review  Increase Physical Activity;Increase Strength and Stamina;Understanding of Exercise Prescription       Comments  Obie Dredge will be graduating next week.  He improved his 6MWT by 14%.  He is going to walk at home as the weather gets better.  He is mulling over some indoor options as well.        Expected Outcomes  Short: Graduate!  Long: Continue to walk at home.           Nutrition & Weight - Outcomes: Pre Biometrics - 09/06/18 1359      Pre Biometrics   Height  5' 8.5" (1.74 m)    Weight  165 lb 6.4 oz (75 kg)    Waist Circumference  36.5 inches    Hip Circumference  36.5 inches    Waist to Hip Ratio  1 %    BMI (Calculated)  24.78    Single Leg Stand  30 seconds      Post Biometrics - 11/18/18 1028       Post  Biometrics   Height  5' 8.5" (1.74 m)    Weight  169 lb (76.7 kg)    Waist Circumference  37 inches    Hip Circumference  36 inches    Waist to Hip Ratio  1.03 %    BMI (Calculated)  25.32    Single Leg Stand  11.06 seconds       Nutrition: Nutrition Therapy & Goals - 09/28/18 1018      Nutrition Therapy   RD  appointment deferred  Yes   he has  been through this program before and has had prior nutrition education      Nutrition Discharge: Nutrition Assessments - 09/06/18 1352      MEDFICTS Scores   Pre Score  29       Education Questionnaire Score: Knowledge Questionnaire Score - 09/06/18 1353      Knowledge Questionnaire Score   Pre Score  20/26   Correct answers reviewed with [name]. Focus on angina, nutrition, exercise, and MI.       Goals reviewed with patient; copy given to patient.

## 2018-12-02 ENCOUNTER — Other Ambulatory Visit: Payer: Self-pay

## 2018-12-02 DIAGNOSIS — I214 Non-ST elevation (NSTEMI) myocardial infarction: Secondary | ICD-10-CM

## 2018-12-02 DIAGNOSIS — Z955 Presence of coronary angioplasty implant and graft: Secondary | ICD-10-CM

## 2018-12-02 NOTE — Progress Notes (Signed)
Cardiac Individual Treatment Plan  Patient Details  Name: Reginald Franklin MRN: [id number] Date of Birth: [birth year] Referring Provider:     Cardiac Rehab from 09/06/2018 in [facility] Cardiac and Pulmonary Rehab  Referring Provider  End, Harrell Gave [facility] Cardiologist: Dr. Chrissie Noa Fath]      Initial Encounter Date:    Cardiac Rehab from 09/06/2018 in [facility] Cardiac and Pulmonary Rehab  Date  09/06/18      Visit Diagnosis: NSTEMI (non-ST elevated myocardial infarction) [facility])  Status post coronary artery stent placement  Patient's Home Medications on Admission:  Current Outpatient Medications:  .  amLODipine (NORVASC) 5 MG tablet, Take 5 mg by mouth daily., Disp: , Rfl: 11 .  aspirin 81 MG chewable tablet, Chew 1 tablet (81 mg total) by mouth daily., Disp: 30 tablet, Rfl: 2 .  atorvastatin (LIPITOR) 80 MG tablet, Take 1 tablet (80 mg total) by mouth daily. (Patient taking differently: Take 20 mg by mouth daily. ), Disp: 30 tablet, Rfl: 1 .  carvedilol (COREG) 6.25 MG tablet, Take 6.25 mg by mouth 2 (two) times daily with a meal., Disp: , Rfl:  .  cetirizine (ZYRTEC) 10 MG tablet, Take 10 mg by mouth at bedtime., Disp: , Rfl:  .  Cholecalciferol 25 MCG (1000 UT) tablet, Take 1,000 Units by mouth daily. , Disp: , Rfl:  .  clopidogrel (PLAVIX) 75 MG tablet, Take 1 tablet (75 mg total) by mouth daily with breakfast., Disp: 30 tablet, Rfl: 11 .  ezetimibe (ZETIA) 10 MG tablet, Take 10 mg by mouth at bedtime., Disp: , Rfl:  .  fexofenadine (ALLEGRA) 60 MG tablet, Take 60 mg by mouth daily., Disp: , Rfl:  .  fluticasone (FLONASE) 50 MCG/ACT nasal spray, Place 2 sprays into both nostrils daily as needed for rhinitis. , Disp: , Rfl:  .  furosemide (LASIX) 20 MG tablet, Take 1 tablet (20 mg total) by mouth daily., Disp: 30 tablet, Rfl: 1 .  isosorbide mononitrate (IMDUR) 30 MG 24 hr tablet, Take 30 mg by mouth daily., Disp: , Rfl: 11 .  losartan (COZAAR) 50 MG tablet, Take 1 tablet (50 mg total)  by mouth daily., Disp: 30 tablet, Rfl: 11 .  Lutein 20 MG TABS, Take 1 tablet by mouth daily. , Disp: , Rfl:  .  metFORMIN (GLUCOPHAGE) 1000 MG tablet, Take 1,000 mg by mouth 2 (two) times daily., Disp: , Rfl: 10 .  Multiple Vitamin (MULTIVITAMIN) tablet, Take 1 tablet by mouth daily., Disp: , Rfl:  .  Multiple Vitamins-Minerals (VISION FORMULA EYE HEALTH PO), Take 1 tablet by mouth daily., Disp: , Rfl:   Past Medical History: Past Medical History:  Diagnosis Date  . Anginal pain ([place])   . BPH (benign prostatic hyperplasia)   . CHF (congestive heart failure) ([place])   . Coronary artery disease   . Diabetes mellitus without complication [facility])    Patient takes Metformin.  . Erosive gastropathy   . Gastritis   . Hemorrhoids   . Hiatal hernia   . Hyperlipidemia   . Hypertension   . Nasal polyp   . Peripheral vascular disease ([place])   . S/P CABG x 3   . Seasonal allergies   . Stroke [facility])     Tobacco Use: Social History   Tobacco Use  Smoking Status Never Smoker  Smokeless Tobacco Never Used    Labs: Recent Review Flowsheet Data    There is no flowsheet data to display.       Exercise Target  Goals: Exercise Program Goal: Individual exercise prescription set using results from initial 6 min walk test and THRR while considering  patient's activity barriers and safety.   Exercise Prescription Goal: Initial exercise prescription builds to 30-45 minutes a day of aerobic activity, 2-3 days per week.  Home exercise guidelines will be given to patient during program as part of exercise prescription that the participant will acknowledge.  Activity Barriers & Risk Stratification: Activity Barriers & Cardiac Risk Stratification - 09/06/18 1331      Activity Barriers & Cardiac Risk Stratification   Activity Barriers  Joint Problems;Deconditioning;Muscular Weakness    Cardiac Risk Stratification  High       6 Minute Walk: 6 Minute Walk    Row Name 09/06/18 1352 11/18/18 1027        6 Minute Walk   Phase  Initial  Discharge    Distance  1088 feet  1250 feet    Distance % Change  -  14.9 %    Distance Feet Change  -  162 ft    Walk Time  6 minutes  6 minutes    # of Rest Breaks  0  0    MPH  2.06  2.37    METS  2.41  2.64    RPE  13  13    VO2 Peak  8.44  9.25    Symptoms  Yes (comment)  Yes (comment)    Comments  shuffles feet while walking  shuffling, leg starting to hurt at end    Resting HR  66 bpm  80 bpm    Resting BP  144/76  122/64    Resting Oxygen Saturation   98 %  98 %    Exercise Oxygen Saturation  during 6 min walk  96 %  97 %    Max Ex. HR  105 bpm  110 bpm    Max Ex. BP  134/58  126/78    2 Minute Post BP  132/70  -       Oxygen Initial Assessment:   Oxygen Re-Evaluation:   Oxygen Discharge (Final Oxygen Re-Evaluation):   Initial Exercise Prescription: Initial Exercise Prescription - 09/06/18 1300      Date of Initial Exercise RX and Referring Provider   Date  09/06/18    Referring Provider  End, Harrell Gave [facility]   Primary Cardiologist: Dr. Bartholome Bill     Treadmill   MPH  1.6    Grade  0.5    Minutes  15    METs  2.34      NuStep   Level  2    SPM  80    Minutes  15    METs  2      Biostep-RELP   Level  2    SPM  50    Minutes  15    METs  2      Prescription Details   Frequency (times per week)  3    Duration  Progress to 30 minutes of continuous aerobic without signs/symptoms of physical distress      Intensity   THRR 40-80% of Max Heartrate  97-128    Ratings of Perceived Exertion  11-13    Perceived Dyspnea  0-4      Progression   Progression  Continue to progress workloads to maintain intensity without signs/symptoms of physical distress.      Resistance Training   Training Prescription  Yes    Weight  3  lbs    Reps  10-15       Perform Capillary Blood Glucose checks as needed.  Exercise Prescription Changes: Exercise Prescription Changes    Row Name 09/06/18 1300 09/28/18 1400 10/12/18  1400 10/26/18 1400 11/10/18 0900     Response to Exercise   Blood Pressure (Admit)  144/76  122/70  132/70  124/62  114/64   Blood Pressure (Exercise)  134/58  150/98  124/70  142/60  128/68   Blood Pressure (Exit)  132/70  128/62  112/78  126/64  106/88   Heart Rate (Admit)  66 bpm  90 bpm  74 bpm  88 bpm  85 bpm   Heart Rate (Exercise)  105 bpm  108 bpm  110 bpm  103 bpm  97 bpm   Heart Rate (Exit)  75 bpm  85 bpm  82 bpm  73 bpm  86 bpm   Oxygen Saturation (Admit)  98 %  -  -  -  -   Oxygen Saturation (Exercise)  96 %  -  -  -  -   Rating of Perceived Exertion (Exercise)  '13  13  12  13  12   ' Symptoms  shuffles feet when walking  shuffles feet when walking  none  none  none   Comments  -  first full day of exercise  -  -  -   Duration  -  Continue with 30 min of aerobic exercise without signs/symptoms of physical distress.  Continue with 30 min of aerobic exercise without signs/symptoms of physical distress.  Continue with 30 min of aerobic exercise without signs/symptoms of physical distress.  Continue with 30 min of aerobic exercise without signs/symptoms of physical distress.   Intensity  -  THRR unchanged  THRR unchanged  THRR unchanged  THRR unchanged     Progression   Progression  -  Continue to progress workloads to maintain intensity without signs/symptoms of physical distress.  Continue to progress workloads to maintain intensity without signs/symptoms of physical distress.  Continue to progress workloads to maintain intensity without signs/symptoms of physical distress.  Continue to progress workloads to maintain intensity without signs/symptoms of physical distress.   Average METs  -  2.52  2.31  2.34  2.97     Resistance Training   Training Prescription  -  Yes  Yes  Yes  Yes   Weight  -  3 lbs  4 lbs  4 lbs  4 lbs   Reps  -  10-15  10-15  10-15  10-15     Interval Training   Interval Training  -  No  No  No  No     Treadmill   MPH  -  1.6  1.6  1.6  1.7   Grade  -  0.5   0.5  0.5  0.5   Minutes  -  '15  15  15  15   ' METs  -  2.34  2.34  2.34  2.42     NuStep   Level  -  '2  2  4  4   ' Minutes  -  '15  15  15  15   ' METs  -  2.7  2.6  2.7  2.5     Biostep-RELP   Level  -  -  '3  3  4   ' Minutes  -  -  '15  15  15   ' METs  -  -  '2  2  2     ' Home Exercise Plan   Plans to continue exercise at  -  -  Home (comment) treadmill  Home (comment) treadmill  Home (comment) treadmill   Frequency  -  -  Add 3 additional days to program exercise sessions.  Add 3 additional days to program exercise sessions.  Add 3 additional days to program exercise sessions.   Initial Home Exercises Provided  -  -  10/05/18  10/05/18  10/05/18   Row Name 11/24/18 1100             Response to Exercise   Blood Pressure (Admit)  124/56       Blood Pressure (Exercise)  134/74       Blood Pressure (Exit)  104/62       Heart Rate (Admit)  80 bpm       Heart Rate (Exercise)  98 bpm       Heart Rate (Exit)  890 bpm       Rating of Perceived Exertion (Exercise)  13       Symptoms  none       Duration  Continue with 30 min of aerobic exercise without signs/symptoms of physical distress.       Intensity  THRR unchanged         Progression   Progression  Continue to progress workloads to maintain intensity without signs/symptoms of physical distress.       Average METs  2.64         Resistance Training   Training Prescription  Yes       Weight  4 lbs       Reps  10-15         Interval Training   Interval Training  No         Treadmill   MPH  1.7       Grade  0.5       Minutes  15       METs  2.42         NuStep   Level  4       Minutes  15       METs  2.5         Biostep-RELP   Level  4       Minutes  15       METs  3         Home Exercise Plan   Plans to continue exercise at  Home (comment) treadmill       Frequency  Add 3 additional days to program exercise sessions.       Initial Home Exercises Provided  10/05/18          Exercise Comments: Exercise Comments     Row Name 09/28/18 0925 12/02/18 1246         Exercise Comments  First full day of exercise!  Patient was oriented to gym and equipment including functions, settings, policies, and procedures.  Patient's individual exercise prescription and treatment plan were reviewed.  All starting workloads were established based on the results of the 6 minute walk test done at initial orientation visit.  The plan for exercise progression was also introduced and progression will be customized based on patient's performance and goals.  Reginald Franklin graduated today from  rehab with 36 sessions completed.  Details of the patient's exercise prescription and what He needs to do in order to continue the prescription and progress  were discussed with patient.  Patient was given a copy of prescription and goals.  Patient verbalized understanding.  Reginald Franklin plans to continue to exercise by walking at home.         Exercise Goals and Review: Exercise Goals    Row Name 09/06/18 1359             Exercise Goals   Increase Physical Activity  Yes       Intervention  Provide advice, education, support and counseling about physical activity/exercise needs.;Develop an individualized exercise prescription for aerobic and resistive training based on initial evaluation findings, risk stratification, comorbidities and participant's personal goals.       Expected Outcomes  Short Term: Attend rehab on a regular basis to increase amount of physical activity.;Long Term: Add in home exercise to make exercise part of routine and to increase amount of physical activity.;Long Term: Exercising regularly at least 3-5 days a week.       Increase Strength and Stamina  Yes       Intervention  Provide advice, education, support and counseling about physical activity/exercise needs.;Develop an individualized exercise prescription for aerobic and resistive training based on initial evaluation findings, risk stratification, comorbidities and participant's  personal goals.       Expected Outcomes  Short Term: Increase workloads from initial exercise prescription for resistance, speed, and METs.;Short Term: Perform resistance training exercises routinely during rehab and add in resistance training at home;Long Term: Improve cardiorespiratory fitness, muscular endurance and strength as measured by increased METs and functional capacity (6MWT)       Able to understand and use rate of perceived exertion (RPE) scale  Yes       Intervention  Provide education and explanation on how to use RPE scale       Expected Outcomes  Short Term: Able to use RPE daily in rehab to express subjective intensity level;Long Term:  Able to use RPE to guide intensity level when exercising independently       Knowledge and understanding of Target Heart Rate Range (THRR)  Yes       Intervention  Provide education and explanation of THRR including how the numbers were predicted and where they are located for reference       Expected Outcomes  Short Term: Able to state/look up THRR;Short Term: Able to use daily as guideline for intensity in rehab;Long Term: Able to use THRR to govern intensity when exercising independently       Able to check pulse independently  Yes       Intervention  Provide education and demonstration on how to check pulse in carotid and radial arteries.;Review the importance of being able to check your own pulse for safety during independent exercise       Expected Outcomes  Short Term: Able to explain why pulse checking is important during independent exercise;Long Term: Able to check pulse independently and accurately       Understanding of Exercise Prescription  Yes       Intervention  Provide education, explanation, and written materials on patient's individual exercise prescription       Expected Outcomes  Short Term: Able to explain program exercise prescription;Long Term: Able to explain home exercise prescription to exercise independently           Exercise Goals Re-Evaluation : Exercise Goals Re-Evaluation    Row Name 09/28/18 0925 10/12/18 1406 10/26/18 1358 10/28/18 0953 11/10/18 0934     Exercise Goal Re-Evaluation  Exercise Goals Review  Increase Physical Activity;Increase Strength and Stamina;Able to understand and use rate of perceived exertion (RPE) scale;Knowledge and understanding of Target Heart Rate Range (THRR);Understanding of Exercise Prescription  Increase Physical Activity;Increase Strength and Stamina;Understanding of Exercise Prescription  Increase Physical Activity;Increase Strength and Stamina;Understanding of Exercise Prescription  Increase Physical Activity;Increase Strength and Stamina;Understanding of Exercise Prescription  Increase Physical Activity;Increase Strength and Stamina;Understanding of Exercise Prescription   Comments  Reviewed RPE scale, THR and program prescription with pt today.  Pt voiced understanding and was given a copy of goals to take home.   Reginald Franklin has been doing well in rehab.  He went over home exercise guidelines last week and plans to use his treadmill at home.  He is up to level 3 on the BioStep.  We will continue to monitor his progress.   Reginald Franklin continues to do well in rehab.  Today, we noted that he had some pauses in his EKG rhythm that were asymptomatic.  These were printed and sent to cardiologist.   He is now using 4 lbs weights and on level 4 on the NuStep.  We will continue to monitor his progress.   Reginald Franklin is doing well in rehab.  He is doing some exercise at home.  He is trying to get out to mailbox twice a day and some calesthenics.  He is not a fan of exercise and would rather just sit down.  We talked about increasing his exercise time of walking to mail box. We also talked about doing chair exercises for cardio.  He is still out of rhythm but has not heard from doctor.  He has noticed that his strength and stamina is starting to recover.  With his rhythm he is still getting SOB.  Reginald Franklin  continues to do well in rehab.  He is nearing graduation already and palns to walk at home for exercise.  He has worked his way up to level 4 on the NuStep and we expect him to improve his post 6MWT.  We will continue to monitor his progress.   Expected Outcomes  Short: Use RPE daily to regulate intensity. Long: Follow program prescription in THR.  Short: Increase workloads.  Long: Continue to exercise more at home.   Short: Increase treadmill.  Long: Continue to increase strength and stamina.   Short: Try to get more exercise at home.  Long: Continue to increase strength and stamina.   Short: Improve post 6MWT  Long: Continue to exercise independently   Row Name 11/23/18 1012             Exercise Goal Re-Evaluation   Exercise Goals Review  Increase Physical Activity;Increase Strength and Stamina;Understanding of Exercise Prescription       Comments  Reginald Franklin will be graduating next week.  He improved his 6MWT by 14%.  He is going to walk at home as the weather gets better.  He is mulling over some indoor options as well.        Expected Outcomes  Short: Graduate!  Long: Continue to walk at home.           Discharge Exercise Prescription (Final Exercise Prescription Changes): Exercise Prescription Changes - 11/24/18 1100      Response to Exercise   Blood Pressure (Admit)  124/56    Blood Pressure (Exercise)  134/74    Blood Pressure (Exit)  104/62    Heart Rate (Admit)  80 bpm    Heart Rate (Exercise)  98 bpm  Heart Rate (Exit)  890 bpm    Rating of Perceived Exertion (Exercise)  13    Symptoms  none    Duration  Continue with 30 min of aerobic exercise without signs/symptoms of physical distress.    Intensity  THRR unchanged      Progression   Progression  Continue to progress workloads to maintain intensity without signs/symptoms of physical distress.    Average METs  2.64      Resistance Training   Training Prescription  Yes    Weight  4 lbs    Reps  10-15      Interval  Training   Interval Training  No      Treadmill   MPH  1.7    Grade  0.5    Minutes  15    METs  2.42      NuStep   Level  4    Minutes  15    METs  2.5      Biostep-RELP   Level  4    Minutes  15    METs  3      Home Exercise Plan   Plans to continue exercise at  Home (comment)   treadmill   Frequency  Add 3 additional days to program exercise sessions.    Initial Home Exercises Provided  10/05/18       Nutrition:  Target Goals: Understanding of nutrition guidelines, daily intake of sodium <1522m, cholesterol <2048m calories 30% from fat and 7% or less from saturated fats, daily to have 5 or more servings of fruits and vegetables.  Biometrics: Pre Biometrics - 09/06/18 1359      Pre Biometrics   Height  5' 8.5" (1.74 m)    Weight  165 lb 6.4 oz (75 kg)    Waist Circumference  36.5 inches    Hip Circumference  36.5 inches    Waist to Hip Ratio  1 %    BMI (Calculated)  24.78    Single Leg Stand  30 seconds      Post Biometrics - 11/18/18 1028       Post  Biometrics   Height  5' 8.5" (1.74 m)    Weight  169 lb (76.7 kg)    Waist Circumference  37 inches    Hip Circumference  36 inches    Waist to Hip Ratio  1.03 %    BMI (Calculated)  25.32    Single Leg Stand  11.06 seconds       Nutrition Therapy Plan and Nutrition Goals: Nutrition Therapy & Goals - 09/28/18 1018      Nutrition Therapy   RD appointment deferred  Yes   he has been through this program before and has had prior nutrition education      Nutrition Assessments: Nutrition Assessments - 09/06/18 1352      MEDFICTS Scores   Pre Score  29       Nutrition Goals Re-Evaluation: Nutrition Goals Re-Evaluation    Row Name 10/28/18 1007 11/23/18 1013           Goals   Nutrition Goal  Heart healthy diet, order healthy, watch sodium intake  Heart healthy diet, order healthy, watch sodium intake      Comment  Reginald Franklin and his wife like to go out to eat a lot.  He and his wife do read  labels, but he knows he could do better with reducing his sodium intake.  He does try to  order healthy on occasion.  He is trying eat more chicken and less burgers.  He is trying to get fruits and vegetable. He usally gets at least one fruit  a day so we talked about increasing to 6-8 servings a day.  He has his fruit with his oatmeal in the morning. He tries to drink enough but still needs some work.  Reginald Franklin will be graduating next week.  They still eat out for most of their meals and he is trying to make better choices.  He continues to try to increase his fruits and vegetables and fish once a week.       Expected Outcome  Short: Increase fruits and vegetables each day.  Long: Continue to try to eat better and watch sodium.   Short: Increase fruit and vegetables.  Long: Try to limit eating out as much.          Nutrition Goals Discharge (Final Nutrition Goals Re-Evaluation): Nutrition Goals Re-Evaluation - 11/23/18 1013      Goals   Nutrition Goal  Heart healthy diet, order healthy, watch sodium intake    Comment  Reginald Franklin will be graduating next week.  They still eat out for most of their meals and he is trying to make better choices.  He continues to try to increase his fruits and vegetables and fish once a week.     Expected Outcome  Short: Increase fruit and vegetables.  Long: Try to limit eating out as much.        Psychosocial: Target Goals: Acknowledge presence or absence of significant depression and/or stress, maximize coping skills, provide positive support system. Participant is able to verbalize types and ability to use techniques and skills needed for reducing stress and depression.   Initial Review & Psychosocial Screening: Initial Psych Review & Screening - 09/06/18 1336      Initial Review   Current issues with  Current Stress Concerns    Source of Stress Concerns  Unable to perform yard/household activities;Unable to participate in former interests or hobbies      [place]?  Yes   Spouse     Barriers   Psychosocial barriers to participate in program  The patient should benefit from training in stress management and relaxation.      Screening Interventions   Interventions  Encouraged to exercise    Expected Outcomes  Long Term Goal: Stressors or current issues are controlled or eliminated.;Short Term goal: Identification and review with participant of any Quality of Life or Depression concerns found by scoring the questionnaire.;Long Term goal: The participant improves quality of Life and PHQ9 Scores as seen by post scores and/or verbalization of changes;Short Term goal: Utilizing psychosocial counselor, staff and physician to assist with identification of specific Stressors or current issues interfering with healing process. Setting desired goal for each stressor or current issue identified.       Quality of Life Scores:  Quality of Life - 09/06/18 1341      Quality of Life   Select  Quality of Life      Quality of Life Scores   Health/Function Pre  28.71 %    Socioeconomic Pre  29 %    Psych/Spiritual Pre  30 %    Family Pre  28.5 %    GLOBAL Pre  29.03 %      Scores of 19 and below usually indicate a poorer quality of life in these areas.  A difference  of  2-3 points is a clinically meaningful difference.  A difference of 2-3 points in the total score of the Quality of Life Index has been associated with significant improvement in overall quality of life, self-image, physical symptoms, and general health in studies assessing change in quality of life.  PHQ-9: Recent Review Flowsheet Data    Depression screen [facility] 2/9 09/06/2018 09/11/2016 06/30/2016   Decreased Interest 0 2 0   Down, Depressed, Hopeless 0 2 0   PHQ - 2 Score 0 4 0   Altered sleeping '1 1 1   ' Tired, decreased energy '1 1 1   ' Change in appetite 0 1 0   Feeling bad or failure about yourself  0 0 0   Trouble concentrating 0 1 0   Moving slowly or fidgety/restless  0 2 0   Suicidal thoughts 0 0 0   PHQ-9 Score '2 10 2   ' Difficult doing work/chores Not difficult at all Not difficult at all Not difficult at all     Interpretation of Total Score  Total Score Depression Severity:  1-4 = Minimal depression, 5-9 = Mild depression, 10-14 = Moderate depression, 15-19 = Moderately severe depression, 20-27 = Severe depression   Psychosocial Evaluation and Intervention: Psychosocial Evaluation - 11/23/18 1029      Discharge Psychosocial Assessment & Intervention   Comments  Counselor met with Reginald Franklin) today prior to discharge.  He reports progress in that he is walking further and doing more around the house - weather permitting.  He continues to sleep well and has minimal stress in his life and maintaining a positive mood.  Reginald Franklin plans to walk more for exercise both in and around the house/outdoors and at the mall if weather is bad.  He hopes his spouse will join him. He was encouraged to consider Silver Sneakers during inclement weather for the sake of consistency.  Reginald Franklin was commended for his progress made while in this program.       Psychosocial Re-Evaluation: Psychosocial Re-Evaluation    [place] Name 10/28/18 [phone]             Psychosocial Re-Evaluation   Current issues with  Current Stress Concerns;Current Sleep Concerns       Comments  Reginald Franklin is doing well in rehab.  He is staying positive overall.  His sleep pattern is still needing some help.  He has some days that he can go right to bed at night and others are a little harder.  He is also napping in his chair as he can't stay awake.  We talked about getting up more to get in his exercise.        Expected Outcomes  Short: Try to get up more frequently.  Long: Continue to stay postive overall.        Interventions  Encouraged to attend Cardiac Rehabilitation for the exercise       Continue Psychosocial Services   Follow up required by staff          Psychosocial Discharge (Final Psychosocial  Re-Evaluation): Psychosocial Re-Evaluation - 10/28/18 0959      Psychosocial Re-Evaluation   Current issues with  Current Stress Concerns;Current Sleep Concerns    Comments  Reginald Franklin is doing well in rehab.  He is staying positive overall.  His sleep pattern is still needing some help.  He has some days that he can go right to bed at night and others are a little harder.  He is also napping in  his chair as he can't stay awake.  We talked about getting up more to get in his exercise.     Expected Outcomes  Short: Try to get up more frequently.  Long: Continue to stay postive overall.     Interventions  Encouraged to attend Cardiac Rehabilitation for the exercise    Continue Psychosocial Services   Follow up required by staff       Vocational Rehabilitation: Provide vocational rehab assistance to qualifying candidates.   Vocational Rehab Evaluation & Intervention: Vocational Rehab - 09/06/18 1354      Initial Vocational Rehab Evaluation & Intervention   Assessment shows need for Vocational Rehabilitation  No       Education: Education Goals: Education classes will be provided on a variety of topics geared toward better understanding of heart health and risk factor modification. Participant will state understanding/return demonstration of topics presented as noted by education test scores.  Learning Barriers/Preferences: Learning Barriers/Preferences - 09/06/18 1352      Learning Barriers/Preferences   Learning Barriers  Hearing    Learning Preferences  None       Education Topics:  AED/CPR: - Group verbal and written instruction with the use of models to demonstrate the basic use of the AED with the basic ABC's of resuscitation.   General Nutrition Guidelines/Fats and Fiber: -Group instruction provided by verbal, written material, models and posters to present the general guidelines for heart healthy nutrition. Gives an explanation and review of dietary fats and fiber.   Cardiac  Rehab from 12/02/2018 in [facility] Cardiac and Pulmonary Rehab  Date  11/30/18  Educator  [facility]  Instruction Review Code  1- Verbalizes Understanding      Controlling Sodium/Reading Food Labels: -Group verbal and written material supporting the discussion of sodium use in heart healthy nutrition. Review and explanation with models, verbal and written materials for utilization of the food label.   Cardiac Rehab from 12/02/2018 in [facility] Cardiac and Pulmonary Rehab  Date  12/02/18  Educator  [facility]  Instruction Review Code  1- Verbalizes Understanding      Exercise Physiology & General Exercise Guidelines: - Group verbal and written instruction with models to review the exercise physiology of the cardiovascular system and associated critical values. Provides general exercise guidelines with specific guidelines to those with heart or lung disease.    Cardiac Rehab from 12/02/2018 in [facility] Cardiac and Pulmonary Rehab  Date  10/12/18  Educator  [facility]  Instruction Review Code  1- Verbalizes Understanding      Aerobic Exercise & Resistance Training: - Gives group verbal and written instruction on the various components of exercise. Focuses on aerobic and resistive training programs and the benefits of this training and how to safely progress through these programs..   Cardiac Rehab from 12/02/2018 in [facility] Cardiac and Pulmonary Rehab  Date  10/14/18  Educator  [place]  Instruction Review Code  1- Verbalizes Understanding      Flexibility, Balance, Mind/Body Relaxation: Provides group verbal/written instruction on the benefits of flexibility and balance training, including mind/body exercise modes such as yoga, pilates and tai chi.  Demonstration and skill practice provided.   Cardiac Rehab from 12/02/2018 in [facility] Cardiac and Pulmonary Rehab  Date  10/19/18  Educator  AS  Instruction Review Code  1- Verbalizes Understanding      Stress and Anxiety: - Provides group verbal and written instruction about the  health risks of elevated stress and causes of high stress.  Discuss the correlation between  heart/lung disease and anxiety and treatment options. Review healthy ways to manage with stress and anxiety.   Cardiac Rehab from 12/02/2018 in [facility] Cardiac and Pulmonary Rehab  Date  11/09/18  Educator  [facility]  Instruction Review Code  1- Verbalizes Understanding      Depression: - Provides group verbal and written instruction on the correlation between heart/lung disease and depressed mood, treatment options, and the stigmas associated with seeking treatment.   Cardiac Rehab from 12/02/2018 in [facility] Cardiac and Pulmonary Rehab  Date  11/23/18  Educator  [facility]  Instruction Review Code  1- Verbalizes Understanding      Anatomy & Physiology of the Heart: - Group verbal and written instruction and models provide basic cardiac anatomy and physiology, with the coronary electrical and arterial systems. Review of Valvular disease and Heart Failure   Cardiac Rehab from 12/02/2018 in [facility] Cardiac and Pulmonary Rehab  Date  10/28/18  Educator  CE  Instruction Review Code  1- Verbalizes Understanding      Cardiac Procedures: - Group verbal and written instruction to review commonly prescribed medications for heart disease. Reviews the medication, class of the drug, and side effects. Includes the steps to properly store meds and maintain the prescription regimen. (beta blockers and nitrates)   Cardiac Rehab from 09/30/2016 in [facility] Cardiac and Pulmonary Rehab  Date  09/02/16  Educator  SB  Instruction Review Code (retired)  2- meets goals/outcomes      Cardiac Medications I: - Group verbal and written instruction to review commonly prescribed medications for heart disease. Reviews the medication, class of the drug, and side effects. Includes the steps to properly store meds and maintain the prescription regimen.   Cardiac Rehab from 12/02/2018 in [facility] Cardiac and Pulmonary Rehab  Date  11/02/18  Educator  SB   Instruction Review Code  1- Verbalizes Understanding      Cardiac Medications II: -Group verbal and written instruction to review commonly prescribed medications for heart disease. Reviews the medication, class of the drug, and side effects. (all other drug classes)   Cardiac Rehab from 12/02/2018 in [facility] Cardiac and Pulmonary Rehab  Date  11/25/18  Educator  CE  Instruction Review Code  1- Verbalizes Understanding       Go Sex-Intimacy & Heart Disease, Get SMART - Goal Setting: - Group verbal and written instruction through game format to discuss heart disease and the return to sexual intimacy. Provides group verbal and written material to discuss and apply goal setting through the application of the S.M.A.R.T. Method.   Cardiac Rehab from 09/30/2016 in [facility] Cardiac and Pulmonary Rehab  Date  09/02/16  Educator  SB  Instruction Review Code (retired)  2- meets goals/outcomes      Other Matters of the Heart: - Provides group verbal, written materials and models to describe Stable Angina and Peripheral Artery. Includes description of the disease process and treatment options available to the cardiac patient.   Cardiac Rehab from 12/02/2018 in [facility] Cardiac and Pulmonary Rehab  Date  10/28/18  Educator  CE  Instruction Review Code  1- Verbalizes Understanding      Exercise & Equipment Safety: - Individual verbal instruction and demonstration of equipment use and safety with use of the equipment.   Cardiac Rehab from 12/02/2018 in [facility] Cardiac and Pulmonary Rehab  Date  09/06/18  Educator  M.C  Instruction Review Code  1- Verbalizes Understanding      Infection Prevention: - Provides verbal and written material to individual  with discussion of infection control including proper hand washing and proper equipment cleaning during exercise session.   Cardiac Rehab from 12/02/2018 in [facility] Cardiac and Pulmonary Rehab  Date  09/06/18  Educator  M.C  Instruction Review Code  1-  Verbalizes Understanding      Falls Prevention: - Provides verbal and written material to individual with discussion of falls prevention and safety.   Cardiac Rehab from 12/02/2018 in [facility] Cardiac and Pulmonary Rehab  Date  09/06/18  Educator  M.C  Instruction Review Code  1- Verbalizes Understanding      Diabetes: - Individual verbal and written instruction to review signs/symptoms of diabetes, desired ranges of glucose level fasting, after meals and with exercise. Acknowledge that pre and post exercise glucose checks will be done for 3 sessions at entry of program.   Cardiac Rehab from 12/02/2018 in [facility] Cardiac and Pulmonary Rehab  Date  09/06/18  Educator  M.C  Instruction Review Code  1- Verbalizes Understanding      Know Your Numbers and Risk Factors: -Group verbal and written instruction about important numbers in your health.  Discussion of what are risk factors and how they play a role in the disease process.  Review of Cholesterol, Blood Pressure, Diabetes, and BMI and the role they play in your overall health.   Cardiac Rehab from 12/02/2018 in [facility] Cardiac and Pulmonary Rehab  Date  11/25/18  Educator  CE  Instruction Review Code  1- Verbalizes Understanding      Sleep Hygiene: -Provides group verbal and written instruction about how sleep can affect your health.  Define sleep hygiene, discuss sleep cycles and impact of sleep habits. Review good sleep hygiene tips.    Cardiac Rehab from 12/02/2018 in [facility] Cardiac and Pulmonary Rehab  Date  10/26/18  Educator  [facility]  Instruction Review Code  1- Verbalizes Understanding      Other: -Provides group and verbal instruction on various topics (see comments)   Knowledge Questionnaire Score: Knowledge Questionnaire Score - 09/06/18 1353      Knowledge Questionnaire Score   Pre Score  20/26   Correct answers reviewed with Reginald Franklin. Focus on angina, nutrition, exercise, and MI.       Core Components/Risk Factors/Patient  Goals at Admission: Personal Goals and Risk Factors at Admission - 09/06/18 1355      Core Components/Risk Factors/Patient Goals on Admission    Weight Management  Weight Maintenance;Yes    Intervention  Weight Management: Develop a combined nutrition and exercise program designed to reach desired caloric intake, while maintaining appropriate intake of nutrient and fiber, sodium and fats, and appropriate energy expenditure required for the weight goal.;Weight Management: Provide education and appropriate resources to help participant work on and attain dietary goals.    Admit Weight  165 lb 6.4 oz (75 kg)    Expected Outcomes  Short Term: Continue to assess and modify interventions until short term weight is achieved;Long Term: Adherence to nutrition and physical activity/exercise program aimed toward attainment of established weight goal;Weight Maintenance: Understanding of the daily nutrition guidelines, which includes 25-35% calories from fat, 7% or less cal from saturated fats, less than 219m cholesterol, less than 1.5gm of sodium, & 5 or more servings of fruits and vegetables daily;Understanding recommendations for meals to include 15-35% energy as protein, 25-35% energy from fat, 35-60% energy from carbohydrates, less than 2074mof dietary cholesterol, 20-35 gm of total fiber daily;Understanding of distribution of calorie intake throughout the day with the consumption of 4-5  meals/snacks    Diabetes  Yes    Intervention  Provide education about signs/symptoms and action to take for hypo/hyperglycemia.;Provide education about proper nutrition, including hydration, and aerobic/resistive exercise prescription along with prescribed medications to achieve blood glucose in normal ranges: Fasting glucose 65-99 mg/dL    Expected Outcomes  Short Term: Participant verbalizes understanding of the signs/symptoms and immediate care of hyper/hypoglycemia, proper foot care and importance of medication,  aerobic/resistive exercise and nutrition plan for blood glucose control.;Long Term: Attainment of HbA1C < 7%.    Heart Failure  Yes    Intervention  Provide a combined exercise and nutrition program that is supplemented with education, support and counseling about heart failure. Directed toward relieving symptoms such as shortness of breath, decreased exercise tolerance, and extremity edema.    Expected Outcomes  Improve functional capacity of life;Short term: Attendance in program 2-3 days a week with increased exercise capacity. Reported lower sodium intake. Reported increased fruit and vegetable intake. Reports medication compliance.;Short term: Daily weights obtained and reported for increase. Utilizing diuretic protocols set by physician.;Long term: Adoption of self-care skills and reduction of barriers for early signs and symptoms recognition and intervention leading to self-care maintenance.    Hypertension  Yes    Intervention  Provide education on lifestyle modifcations including regular physical activity/exercise, weight management, moderate sodium restriction and increased consumption of fresh fruit, vegetables, and low fat dairy, alcohol moderation, and smoking cessation.;Monitor prescription use compliance.    Expected Outcomes  Short Term: Continued assessment and intervention until BP is < 140/68m HG in hypertensive participants. < 130/846mHG in hypertensive participants with diabetes, heart failure or chronic kidney disease.;Long Term: Maintenance of blood pressure at goal levels.    Lipids  Yes    Intervention  Provide education and support for participant on nutrition & aerobic/resistive exercise along with prescribed medications to achieve LDL <7046mHDL >55m63m  Expected Outcomes  Short Term: Participant states understanding of desired cholesterol values and is compliant with medications prescribed. Participant is following exercise prescription and nutrition guidelines.;Long Term:  Cholesterol controlled with medications as prescribed, with individualized exercise RX and with personalized nutrition plan. Value goals: LDL < 70mg62mL > 40 mg.       Core Components/Risk Factors/Patient Goals Review:  Goals and Risk Factor Review    Row Name 10/28/18 1012 11/23/18 1015           Core Components/Risk Factors/Patient Goals Review   Personal Goals Review  Weight Management/Obesity;Diabetes;Hypertension;Lipids  Weight Management/Obesity;Diabetes;Hypertension;Lipids      Review  Reginald Franklin's weight continues to move up and down about 1-2lbs each day.  He continues weigh daily.  He has not been checking his blood sugars.  He got a meter from [employer] [place].   He did check it yesterday, but that was the first time in a while.  He also has blood pressure cuff but does not check it at home.  We talked about the importance of checking both daily in order to try to catch problems before they get really bad.  He is doing well with medications and feels they are working for him.   He has a follow up appointment later this month and will talk to doctor about all of this.   Reginald Franklin be graduating next week.  His weight stays fairly steady around 158 lbs at home.   He has been using his new meter up until last week.  He has just been looking  at it. We talked about how inusrance may take away his coverage for supplies if he is not using it.  His blood pressures have been good in class., but not checking at home.  Talked about making sure he keeps an eye on it at home after he graduates.  He is doing well with his medications.       Expected Outcomes  Short: Start to check blod pressures and blood sugars more frequently.  Long: Continue to work on weight loss.   Short: Use meter and cuff to check numbers at home.  Long: Continue to montior risk factors.          Core Components/Risk Factors/Patient Goals at Discharge (Final Review):  Goals and Risk Factor Review - 11/23/18 1015      Core  Components/Risk Factors/Patient Goals Review   Personal Goals Review  Weight Management/Obesity;Diabetes;Hypertension;Lipids    Review  Reginald Franklin will be graduating next week.  His weight stays fairly steady around 158 lbs at home.   He has been using his new meter up until last week.  He has just been looking at it. We talked about how inusrance may take away his coverage for supplies if he is not using it.  His blood pressures have been good in class., but not checking at home.  Talked about making sure he keeps an eye on it at home after he graduates.  He is doing well with his medications.     Expected Outcomes  Short: Use meter and cuff to check numbers at home.  Long: Continue to montior risk factors.        ITP Comments: ITP Comments    Row Name 09/06/18 1313 09/21/18 0629 10/20/18 0942 10/26/18 1359 11/17/18 0605   ITP Comments  Med review completed. Initial ITP created, diagnosis can be found in [facility] 12/3  30 Day Review. Continue with ITP unless directed changes per Medical Director review.  Starts on 09/28/2018  30 Day Review. Continue with ITP unless directed changes per Medical Director review.  Today, we noted that he had some pauses in his EKG rhythm that were asymptomatic.  These were printed and sent to cardiologist.   30 day review. Continue with ITP unless directed changes by Medical Director chart review.   [place] Name 12/02/18 1246           ITP Comments  Discharge ITP sent and signed by Dr. Sabra Heck.  Discharge Summary routed to PCP and cardiologist.          Comments: Discharge ITP

## 2018-12-02 NOTE — Progress Notes (Signed)
Discharge Progress Report  Patient Details  Name: Reginald Franklin MRN: [id number] Date of Birth: [birth year] Referring Provider:     Cardiac Rehab from 09/06/2018 in [facility] Cardiac and Pulmonary Rehab  Referring Provider  End, Cristal Deer [facility] Cardiologist: Dr. Iantha Fallen Fath]       Number of Visits: 36  Reason for Discharge:  Patient reached a stable level of exercise. Patient independent in their exercise.  Smoking History:  Social History   Tobacco Use  Smoking Status Never Smoker  Smokeless Tobacco Never Used    Diagnosis:  NSTEMI (non-ST elevated myocardial infarction) (HCC)  Status post coronary artery stent placement  ADL UCSD:   Initial Exercise Prescription: Initial Exercise Prescription - 09/06/18 1300      Date of Initial Exercise RX and Referring Provider   Date  09/06/18    Referring Provider  End, Cristal Deer [facility]   Primary Cardiologist: Dr. Harold Hedge     Treadmill   MPH  1.6    Grade  0.5    Minutes  15    METs  2.34      NuStep   Level  2    SPM  80    Minutes  15    METs  2      Biostep-RELP   Level  2    SPM  50    Minutes  15    METs  2      Prescription Details   Frequency (times per week)  3    Duration  Progress to 30 minutes of continuous aerobic without signs/symptoms of physical distress      Intensity   THRR 40-80% of Max Heartrate  97-128    Ratings of Perceived Exertion  11-13    Perceived Dyspnea  0-4      Progression   Progression  Continue to progress workloads to maintain intensity without signs/symptoms of physical distress.      Resistance Training   Training Prescription  Yes    Weight  3 lbs    Reps  10-15       Discharge Exercise Prescription (Final Exercise Prescription Changes): Exercise Prescription Changes - 11/24/18 1100      Response to Exercise   Blood Pressure (Admit)  124/56    Blood Pressure (Exercise)  134/74    Blood Pressure (Exit)  104/62    Heart Rate (Admit)  80 bpm    Heart  Rate (Exercise)  98 bpm    Heart Rate (Exit)  890 bpm    Rating of Perceived Exertion (Exercise)  13    Symptoms  none    Duration  Continue with 30 min of aerobic exercise without signs/symptoms of physical distress.    Intensity  THRR unchanged      Progression   Progression  Continue to progress workloads to maintain intensity without signs/symptoms of physical distress.    Average METs  2.64      Resistance Training   Training Prescription  Yes    Weight  4 lbs    Reps  10-15      Interval Training   Interval Training  No      Treadmill   MPH  1.7    Grade  0.5    Minutes  15    METs  2.42      NuStep   Level  4    Minutes  15    METs  2.5      Biostep-RELP  Level  4    Minutes  15    METs  3      Home Exercise Plan   Plans to continue exercise at  Home (comment)   treadmill   Frequency  Add 3 additional days to program exercise sessions.    Initial Home Exercises Provided  10/05/18       Functional Capacity: 6 Minute Walk    Row Name 09/06/18 1352 11/18/18 1027       6 Minute Walk   Phase  Initial  Discharge    Distance  1088 feet  1250 feet    Distance % Change  -  14.9 %    Distance Feet Change  -  162 ft    Walk Time  6 minutes  6 minutes    # of Rest Breaks  0  0    MPH  2.06  2.37    METS  2.41  2.64    RPE  13  13    VO2 Peak  8.44  9.25    Symptoms  Yes (comment)  Yes (comment)    Comments  shuffles feet while walking  shuffling, leg starting to hurt at end    Resting HR  66 bpm  80 bpm    Resting BP  144/76  122/64    Resting Oxygen Saturation   98 %  98 %    Exercise Oxygen Saturation  during 6 min walk  96 %  97 %    Max Ex. HR  105 bpm  110 bpm    Max Ex. BP  134/58  126/78    2 Minute Post BP  132/70  -       Psychological, QOL, Others - Outcomes: PHQ 2/9: Depression screen [facility] 2/9 09/06/2018 09/11/2016 06/30/2016  Decreased Interest 0 2 0  Down, Depressed, Hopeless 0 2 0  PHQ - 2 Score 0 4 0  Altered sleeping Tired, decreased energy Change in appetite 0 1 0  Feeling bad or failure about yourself  0 0 0  Trouble concentrating 0 1 0  Moving slowly or fidgety/restless 0 2 0  Suicidal thoughts 0 0 0  PHQ-9 Score Difficult doing work/chores Not difficult at all Not difficult at all Not difficult at all    Quality of Life: Quality of Life - 09/06/18 1341      Quality of Life   Select  Quality of Life      Quality of Life Scores   Health/Function Pre  28.71 %    Socioeconomic Pre  29 %    Psych/Spiritual Pre  30 %    Family Pre  28.5 %    GLOBAL Pre  29.03 %       Personal Goals: Goals established at orientation with interventions provided to work toward goal. Personal Goals and Risk Factors at Admission - 09/06/18 1355      Core Components/Risk Factors/Patient Goals on Admission    Weight Management  Weight Maintenance;Yes    Intervention  Weight Management: Develop a combined nutrition and exercise program designed to reach desired caloric intake, while maintaining appropriate intake of nutrient and fiber, sodium and fats, and appropriate energy expenditure required for the weight goal.;Weight Management: Provide education and appropriate resources to help participant work on and attain dietary goals.    Admit Weight  165 lb 6.4 oz (75 kg)    Expected Outcomes  Short Term: Continue to assess and modify interventions until short term weight is achieved;Long Term: Adherence to nutrition and physical activity/exercise program aimed toward attainment of established weight goal;Weight Maintenance: Understanding of the daily nutrition guidelines, which includes 25-35% calories from fat, 7% or less cal from saturated fats, less than 200mg  cholesterol, less than 1.5gm of sodium, & 5 or more servings of fruits and vegetables daily;Understanding recommendations for meals to include 15-35% energy as protein, 25-35% energy from fat, 35-60% energy from carbohydrates, less than 200mg  of  dietary cholesterol, 20-35 gm of total fiber daily;Understanding of distribution of calorie intake throughout the day with the consumption of 4-5 meals/snacks    Diabetes  Yes    Intervention  Provide education about signs/symptoms and action to take for hypo/hyperglycemia.;Provide education about proper nutrition, including hydration, and aerobic/resistive exercise prescription along with prescribed medications to achieve blood glucose in normal ranges: Fasting glucose 65-99 mg/dL    Expected Outcomes  Short Term: Participant verbalizes understanding of the signs/symptoms and immediate care of hyper/hypoglycemia, proper foot care and importance of medication, aerobic/resistive exercise and nutrition plan for blood glucose control.;Long Term: Attainment of HbA1C < 7%.    Heart Failure  Yes    Intervention  Provide a combined exercise and nutrition program that is supplemented with education, support and counseling about heart failure. Directed toward relieving symptoms such as shortness of breath, decreased exercise tolerance, and extremity edema.    Expected Outcomes  Improve functional capacity of life;Short term: Attendance in program 2-3 days a week with increased exercise capacity. Reported lower sodium intake. Reported increased fruit and vegetable intake. Reports medication compliance.;Short term: Daily weights obtained and reported for increase. Utilizing diuretic protocols set by physician.;Long term: Adoption of self-care skills and reduction of barriers for early signs and symptoms recognition and intervention leading to self-care maintenance.    Hypertension  Yes    Intervention  Provide education on lifestyle modifcations including regular physical activity/exercise, weight management, moderate sodium restriction and increased consumption of fresh fruit, vegetables, and low fat dairy, alcohol moderation, and smoking cessation.;Monitor prescription use compliance.    Expected Outcomes  Short  Term: Continued assessment and intervention until BP is < 140/38mm HG in hypertensive participants. < 130/89mm HG in hypertensive participants with diabetes, heart failure or chronic kidney disease.;Long Term: Maintenance of blood pressure at goal levels.    Lipids  Yes    Intervention  Provide education and support for participant on nutrition & aerobic/resistive exercise along with prescribed medications to achieve LDL 70mg , HDL >40mg .    Expected Outcomes  Short Term: Participant states understanding of desired cholesterol values and is compliant with medications prescribed. Participant is following exercise prescription and nutrition guidelines.;Long Term: Cholesterol controlled with medications as prescribed, with individualized exercise RX and with personalized nutrition plan. Value goals: LDL < 70mg , HDL > 40 mg.        Personal Goals Discharge: Goals and Risk Factor Review    Row Name 10/28/18 1012 11/23/18 1015           Core Components/Risk Factors/Patient Goals Review   Personal Goals Review  Weight Management/Obesity;Diabetes;Hypertension;Lipids  Weight Management/Obesity;Diabetes;Hypertension;Lipids      Review  [name]'s weight continues to move up and down about 1-2lbs each day.  He continues weigh daily.  He has not been checking his blood sugars.  He got a meter from [employer] to check it at home.   He did check it yesterday, but that was the first time in a while.  He also has blood pressure cuff but does not check it at home.  We talked about the importance of checking both daily in order to try to catch problems before they get really bad.  He is doing well with medications and feels they are working for him.   He has a follow up appointment later this month and will talk to doctor about all of this.   Beckie Busing will be graduating next week.  His weight stays fairly steady around 158 lbs at home.   He has been using his new meter up until last week.  He has just been looking at it. We talked  about how inusrance may take away his coverage for supplies if he is not using it.  His blood pressures have been good in class., but not checking at home.  Talked about making sure he keeps an eye on it at home after he graduates.  He is doing well with his medications.       Expected Outcomes  Short: Start to check blod pressures and blood sugars more frequently.  Long: Continue to work on weight loss.   Short: Use meter and cuff to check numbers at home.  Long: Continue to montior risk factors.          Exercise Goals and Review: Exercise Goals    Row Name 09/06/18 1359             Exercise Goals   Increase Physical Activity  Yes       Intervention  Provide advice, education, support and counseling about physical activity/exercise needs.;Develop an individualized exercise prescription for aerobic and resistive training based on initial evaluation findings, risk stratification, comorbidities and participant's personal goals.       Expected Outcomes  Short Term: Attend rehab on a regular basis to increase amount of physical activity.;Long Term: Add in home exercise to make exercise part of routine and to increase amount of physical activity.;Long Term: Exercising regularly at least 3-5 days a week.       Increase Strength and Stamina  Yes       Intervention  Provide advice, education, support and counseling about physical activity/exercise needs.;Develop an individualized exercise prescription for aerobic and resistive training based on initial evaluation findings, risk stratification, comorbidities and participant's personal goals.       Expected Outcomes  Short Term: Increase workloads from initial exercise prescription for resistance, speed, and METs.;Short Term: Perform resistance training exercises routinely during rehab and add in resistance training at home;Long Term: Improve cardiorespiratory fitness, muscular endurance and strength as measured by increased METs and functional capacity  ( )       Able to understand and use rate of perceived exertion (RPE) scale  Yes       Intervention  Provide education and explanation on how to use RPE scale       Expected Outcomes  Short Term: Able to use RPE daily in rehab to express subjective intensity level;Long Term:  Able to use RPE to guide intensity level when exercising independently       Knowledge and understanding of Target Heart Rate Range (THRR)  Yes       Intervention  Provide education and explanation of THRR including how the numbers were predicted and where they are located for reference       Expected Outcomes  Short Term: Able to state/look up THRR;Short Term: Able to use daily as guideline for intensity in rehab;Long Term: Able to use THRR  to govern intensity when exercising independently       Able to check pulse independently  Yes       Intervention  Provide education and demonstration on how to check pulse in carotid and radial arteries.;Review the importance of being able to check your own pulse for safety during independent exercise       Expected Outcomes  Short Term: Able to explain why pulse checking is important during independent exercise;Long Term: Able to check pulse independently and accurately       Understanding of Exercise Prescription  Yes       Intervention  Provide education, explanation, and written materials on patient's individual exercise prescription       Expected Outcomes  Short Term: Able to explain program exercise prescription;Long Term: Able to explain home exercise prescription to exercise independently          Exercise Goals Re-Evaluation: Exercise Goals Re-Evaluation    Row Name 09/28/18 0925 10/12/18 1406 10/26/18 1358 10/28/18 0953 11/10/18 0934     Exercise Goal Re-Evaluation   Exercise Goals Review  Increase Physical Activity;Increase Strength and Stamina;Able to understand and use rate of perceived exertion (RPE) scale;Knowledge and understanding of Target Heart Rate Range  (THRR);Understanding of Exercise Prescription  Increase Physical Activity;Increase Strength and Stamina;Understanding of Exercise Prescription  Increase Physical Activity;Increase Strength and Stamina;Understanding of Exercise Prescription  Increase Physical Activity;Increase Strength and Stamina;Understanding of Exercise Prescription  Increase Physical Activity;Increase Strength and Stamina;Understanding of Exercise Prescription   Comments  Reviewed RPE scale, THR and program prescription with pt today.  Pt voiced understanding and was given a copy of goals to take home.   Beckie Busing has been doing well in rehab.  He went over home exercise guidelines last week and plans to use his treadmill at home.  He is up to level 3 on the BioStep.  We will continue to monitor his progress.   Beckie Busing continues to do well in rehab.  Today, we noted that he had some pauses in his EKG rhythm that were asymptomatic.  These were printed and sent to cardiologist.   He is now using 4 lbs weights and on level 4 on the NuStep.  We will continue to monitor his progress.   Beckie Busing is doing well in rehab.  He is doing some exercise at home.  He is trying to get out to mailbox twice a day and some calesthenics.  He is not a fan of exercise and would rather just sit down.  We talked about increasing his exercise time of walking to mail box. We also talked about doing chair exercises for cardio.  He is still out of rhythm but has not heard from doctor.  He has noticed that his strength and stamina is starting to recover.  With his rhythm he is still getting SOB.  Beckie Busing continues to do well in rehab.  He is nearing graduation already and palns to walk at home for exercise.  He has worked his way up to level 4 on the NuStep and we expect him to improve his post .  We will continue to monitor his progress.   Expected Outcomes  Short: Use RPE daily to regulate intensity. Long: Follow program prescription in THR.  Short: Increase workloads.  Long: Continue  to exercise more at home.   Short: Increase treadmill.  Long: Continue to increase strength and stamina.   Short: Try to get more exercise at home.  Long: Continue to increase strength and stamina.  Short: Improve post  Long: Continue to exercise independently   Row Name 11/23/18 1012             Exercise Goal Re-Evaluation   Exercise Goals Review  Increase Physical Activity;Increase Strength and Stamina;Understanding of Exercise Prescription       Comments  Beckie Busing will be graduating next week.  He improved his by 14%.  He is going to walk at home as the weather gets better.  He is mulling over some indoor options as well.        Expected Outcomes  Short: Graduate!  Long: Continue to walk at home.           Nutrition & Weight - Outcomes: Pre Biometrics - 09/06/18 1359      Pre Biometrics   Height  5' 8.5" (1.74 m)    Weight  165 lb 6.4 oz (75 kg)    Waist Circumference  36.5 inches    Hip Circumference  36.5 inches    Waist to Hip Ratio  1 %    BMI (Calculated)  24.78    Single Leg Stand  30 seconds      Post Biometrics - 11/18/18 1028       Post  Biometrics   Height  5' 8.5" (1.74 m)    Weight  169 lb (76.7 kg)    Waist Circumference  37 inches    Hip Circumference  36 inches    Waist to Hip Ratio  1.03 %    BMI (Calculated)  25.32    Single Leg Stand  11.06 seconds       Nutrition: Nutrition Therapy & Goals - 09/28/18 1018      Nutrition Therapy   RD appointment deferred  Yes   he has been through this program before and has had prior nutrition education      Nutrition Discharge: Nutrition Assessments - 09/06/18 1352      MEDFICTS Scores   Pre Score  29       Education Questionnaire Score: Knowledge Questionnaire Score - 09/06/18 1353      Knowledge Questionnaire Score   Pre Score  20/26   Correct answers reviewed with [name]. Focus on angina, nutrition, exercise, and MI.       Goals reviewed with patient; copy given to patient.

## 2018-12-02 NOTE — Progress Notes (Signed)
Daily Session Note  Patient Details  Name: Reginald Franklin MRN: [id number] Date of Birth: 06-17-1941 Referring Provider:     Cardiac Rehab from 09/06/2018 in [facility] Cardiac and Pulmonary Rehab  Referring Provider  End, Harrell Gave [facility] Cardiologist: Dr. Chrissie Noa Fath]      Encounter Date: 12/02/2018  Check In: Session Check In - 12/02/18 1224      Check-In   Supervising physician immediately available to respond to emergencies  See telemetry face sheet for immediately available ER MD    Location  [facility]-Cardiac & Pulmonary Rehab    Staff Present  Alberteen Sam, MA, RCEP, CCRP, Exercise Physiologist;Joseph [place] [employer];Heath Lark, RN, BSN, CCRP    Medication changes reported      No    Fall or balance concerns reported     No    Warm-up and Cool-down  Performed as group-led instruction    Resistance Training Performed  Yes    VAD Patient?  No    PAD/SET Patient?  No      Pain Assessment   Currently in Pain?  No/denies          Social History   Tobacco Use  Smoking Status Never Smoker  Smokeless Tobacco Never Used    Goals Met:  Independence with exercise equipment Exercise tolerated well No report of cardiac concerns or symptoms  Goals Unmet:  Not Applicable  Comments: Pt able to follow exercise prescription today without complaint.  Will continue to monitor for progression.  [name] graduated today from  rehab with 36 sessions completed.  Details of the patient's exercise prescription and what He needs to do in order to continue the prescription and progress were discussed with patient.  Patient was given a copy of prescription and goals.  Patient verbalized understanding.  [name] plans to continue to exercise by walking at home.   Dr. Emily Filbert is Medical Director for [place] [facility].

## 2019-01-06 ENCOUNTER — Inpatient Hospital Stay: Payer: Medicare Other

## 2019-01-06 ENCOUNTER — Other Ambulatory Visit: Payer: Self-pay

## 2019-01-06 ENCOUNTER — Encounter: Payer: Self-pay | Admitting: Emergency Medicine

## 2019-01-06 ENCOUNTER — Emergency Department: Payer: Medicare Other

## 2019-01-06 DIAGNOSIS — I214 Non-ST elevation (NSTEMI) myocardial infarction: Secondary | ICD-10-CM | POA: Diagnosis present

## 2019-01-06 DIAGNOSIS — Z7902 Long term (current) use of antithrombotics/antiplatelets: Secondary | ICD-10-CM

## 2019-01-06 DIAGNOSIS — D649 Anemia, unspecified: Secondary | ICD-10-CM | POA: Diagnosis present

## 2019-01-06 DIAGNOSIS — Z7189 Other specified counseling: Secondary | ICD-10-CM | POA: Diagnosis not present

## 2019-01-06 DIAGNOSIS — Z79899 Other long term (current) drug therapy: Secondary | ICD-10-CM

## 2019-01-06 DIAGNOSIS — J9600 Acute respiratory failure, unspecified whether with hypoxia or hypercapnia: Secondary | ICD-10-CM

## 2019-01-06 DIAGNOSIS — E785 Hyperlipidemia, unspecified: Secondary | ICD-10-CM | POA: Diagnosis present

## 2019-01-06 DIAGNOSIS — J9602 Acute respiratory failure with hypercapnia: Secondary | ICD-10-CM | POA: Diagnosis present

## 2019-01-06 DIAGNOSIS — Z7982 Long term (current) use of aspirin: Secondary | ICD-10-CM

## 2019-01-06 DIAGNOSIS — I469 Cardiac arrest, cause unspecified: Secondary | ICD-10-CM

## 2019-01-06 DIAGNOSIS — Z7984 Long term (current) use of oral hypoglycemic drugs: Secondary | ICD-10-CM | POA: Diagnosis not present

## 2019-01-06 DIAGNOSIS — I5023 Acute on chronic systolic (congestive) heart failure: Secondary | ICD-10-CM | POA: Diagnosis present

## 2019-01-06 DIAGNOSIS — R778 Other specified abnormalities of plasma proteins: Secondary | ICD-10-CM

## 2019-01-06 DIAGNOSIS — N400 Enlarged prostate without lower urinary tract symptoms: Secondary | ICD-10-CM | POA: Diagnosis present

## 2019-01-06 DIAGNOSIS — I509 Heart failure, unspecified: Secondary | ICD-10-CM

## 2019-01-06 DIAGNOSIS — J9601 Acute respiratory failure with hypoxia: Secondary | ICD-10-CM

## 2019-01-06 DIAGNOSIS — Z8673 Personal history of transient ischemic attack (TIA), and cerebral infarction without residual deficits: Secondary | ICD-10-CM

## 2019-01-06 DIAGNOSIS — Z7951 Long term (current) use of inhaled steroids: Secondary | ICD-10-CM

## 2019-01-06 DIAGNOSIS — N183 Chronic kidney disease, stage 3 (moderate): Secondary | ICD-10-CM | POA: Diagnosis present

## 2019-01-06 DIAGNOSIS — R7989 Other specified abnormal findings of blood chemistry: Secondary | ICD-10-CM

## 2019-01-06 DIAGNOSIS — I1310 Hypertensive heart and chronic kidney disease without heart failure, with stage 1 through stage 4 chronic kidney disease, or unspecified chronic kidney disease: Secondary | ICD-10-CM | POA: Diagnosis present

## 2019-01-06 DIAGNOSIS — Z515 Encounter for palliative care: Secondary | ICD-10-CM | POA: Diagnosis not present

## 2019-01-06 DIAGNOSIS — I2510 Atherosclerotic heart disease of native coronary artery without angina pectoris: Secondary | ICD-10-CM | POA: Diagnosis present

## 2019-01-06 DIAGNOSIS — Z20828 Contact with and (suspected) exposure to other viral communicable diseases: Secondary | ICD-10-CM | POA: Diagnosis present

## 2019-01-06 DIAGNOSIS — I429 Cardiomyopathy, unspecified: Secondary | ICD-10-CM | POA: Diagnosis present

## 2019-01-06 DIAGNOSIS — Z66 Do not resuscitate: Secondary | ICD-10-CM | POA: Diagnosis present

## 2019-01-06 DIAGNOSIS — Z978 Presence of other specified devices: Secondary | ICD-10-CM

## 2019-01-06 DIAGNOSIS — E1151 Type 2 diabetes mellitus with diabetic peripheral angiopathy without gangrene: Secondary | ICD-10-CM | POA: Diagnosis present

## 2019-01-06 DIAGNOSIS — R9431 Abnormal electrocardiogram [ECG] [EKG]: Secondary | ICD-10-CM

## 2019-01-06 DIAGNOSIS — Z955 Presence of coronary angioplasty implant and graft: Secondary | ICD-10-CM

## 2019-01-06 DIAGNOSIS — R079 Chest pain, unspecified: Secondary | ICD-10-CM

## 2019-01-06 DIAGNOSIS — N179 Acute kidney failure, unspecified: Secondary | ICD-10-CM | POA: Diagnosis present

## 2019-01-06 DIAGNOSIS — R570 Cardiogenic shock: Secondary | ICD-10-CM | POA: Diagnosis present

## 2019-01-06 DIAGNOSIS — Z951 Presence of aortocoronary bypass graft: Secondary | ICD-10-CM | POA: Diagnosis not present

## 2019-01-06 LAB — COMPREHENSIVE METABOLIC PANEL
ALT: 20 U/L (ref 0–44)
ALT: 22 U/L (ref 0–44)
AST: 25 U/L (ref 15–41)
AST: 55 U/L — ABNORMAL HIGH (ref 15–41)
Albumin: 4.9 g/dL (ref 3.5–5.0)
Albumin: 3.8 g/dL (ref 3.5–5.0)
Alkaline Phosphatase: 39 U/L (ref 38–126)
Alkaline Phosphatase: 38 U/L (ref 38–126)
Anion gap: 15 (ref 5–15)
Anion gap: 22 — ABNORMAL HIGH (ref 5–15)
BUN: 32 mg/dL — ABNORMAL HIGH (ref 8–23)
BUN: 32 mg/dL — ABNORMAL HIGH (ref 8–23)
CO2: 22 mmol/L (ref 22–32)
CO2: 23 mmol/L (ref 22–32)
Calcium: 9.5 mg/dL (ref 8.9–10.3)
Calcium: 8.3 mg/dL — ABNORMAL LOW (ref 8.9–10.3)
Chloride: 101 mmol/L (ref 98–111)
Chloride: 95 mmol/L — ABNORMAL LOW (ref 98–111)
Creatinine, Ser: 1.66 mg/dL — ABNORMAL HIGH (ref 0.61–1.24)
Creatinine, Ser: 1.56 mg/dL — ABNORMAL HIGH (ref 0.61–1.24)
GFR calc Af Amer: 45 mL/min — ABNORMAL LOW (ref >60)
GFR calc Af Amer: 49 mL/min — ABNORMAL LOW (ref >60)
GFR calc non Af Amer: 39 mL/min — ABNORMAL LOW (ref >60)
GFR calc non Af Amer: 42 mL/min — ABNORMAL LOW (ref >60)
Glucose, Bld: 160 mg/dL — ABNORMAL HIGH (ref 70–99)
Glucose, Bld: 178 mg/dL — ABNORMAL HIGH (ref 70–99)
Potassium: 3.9 mmol/L (ref 3.5–5.1)
Potassium: 2.9 mmol/L — ABNORMAL LOW (ref 3.5–5.1)
Sodium: 138 mmol/L (ref 135–145)
Sodium: 140 mmol/L (ref 135–145)
Total Bilirubin: 2.4 mg/dL — ABNORMAL HIGH (ref 0.3–1.2)
Total Bilirubin: 2.1 mg/dL — ABNORMAL HIGH (ref 0.3–1.2)
Total Protein: 8.1 g/dL (ref 6.5–8.1)
Total Protein: 6.4 g/dL — ABNORMAL LOW (ref 6.5–8.1)

## 2019-01-06 LAB — CBC WITH DIFFERENTIAL/PLATELET
Abs Immature Granulocytes: 0.05 10*3/uL (ref 0.00–0.07)
Abs Immature Granulocytes: 0.16 10*3/uL — ABNORMAL HIGH (ref 0.00–0.07)
Basophils Absolute: 0.1 10*3/uL (ref 0.0–0.1)
Basophils Absolute: 0.1 10*3/uL (ref 0.0–0.1)
Basophils Relative: 1 %
Basophils Relative: 0 %
Eosinophils Absolute: 0.2 10*3/uL (ref 0.0–0.5)
Eosinophils Absolute: 0 10*3/uL (ref 0.0–0.5)
Eosinophils Relative: 1 %
Eosinophils Relative: 0 %
HCT: 38.7 % — ABNORMAL LOW (ref 39.0–52.0)
HCT: 37.3 % — ABNORMAL LOW (ref 39.0–52.0)
Hemoglobin: 12.5 g/dL — ABNORMAL LOW (ref 13.0–17.0)
Hemoglobin: 11.7 g/dL — ABNORMAL LOW (ref 13.0–17.0)
Immature Granulocytes: 0 %
Immature Granulocytes: 1 %
Lymphocytes Relative: 11 %
Lymphocytes Relative: 22 %
Lymphs Abs: 1.5 10*3/uL (ref 0.7–4.0)
Lymphs Abs: 4 10*3/uL (ref 0.7–4.0)
MCH: 30.3 pg (ref 26.0–34.0)
MCH: 30 pg (ref 26.0–34.0)
MCHC: 32.3 g/dL (ref 30.0–36.0)
MCHC: 31.4 g/dL (ref 30.0–36.0)
MCV: 93.7 fL (ref 80.0–100.0)
MCV: 95.6 fL (ref 80.0–100.0)
Monocytes Absolute: 1 10*3/uL (ref 0.1–1.0)
Monocytes Absolute: 1.1 10*3/uL — ABNORMAL HIGH (ref 0.1–1.0)
Monocytes Relative: 7 %
Monocytes Relative: 6 %
Neutro Abs: 10.8 10*3/uL — ABNORMAL HIGH (ref 1.7–7.7)
Neutro Abs: 12.7 10*3/uL — ABNORMAL HIGH (ref 1.7–7.7)
Neutrophils Relative %: 80 %
Neutrophils Relative %: 71 %
Platelets: 275 10*3/uL (ref 150–400)
Platelets: 233 10*3/uL (ref 150–400)
RBC: 4.13 MIL/uL — ABNORMAL LOW (ref 4.22–5.81)
RBC: 3.9 MIL/uL — ABNORMAL LOW (ref 4.22–5.81)
RDW: 14.2 % (ref 11.5–15.5)
RDW: 14.3 % (ref 11.5–15.5)
WBC: 13.6 10*3/uL — ABNORMAL HIGH (ref 4.0–10.5)
WBC: 18 10*3/uL — ABNORMAL HIGH (ref 4.0–10.5)
nRBC: 0 % (ref 0.0–0.2)
nRBC: 0.2 % (ref 0.0–0.2)

## 2019-01-06 LAB — GLUCOSE, CAPILLARY
Glucose-Capillary: 155 mg/dL — ABNORMAL HIGH (ref 70–99)
Glucose-Capillary: 144 mg/dL — ABNORMAL HIGH (ref 70–99)
Glucose-Capillary: 139 mg/dL — ABNORMAL HIGH (ref 70–99)
Glucose-Capillary: 292 mg/dL — ABNORMAL HIGH (ref 70–99)
Glucose-Capillary: 200 mg/dL — ABNORMAL HIGH (ref 70–99)
Glucose-Capillary: 179 mg/dL — ABNORMAL HIGH (ref 70–99)

## 2019-01-06 LAB — BLOOD GAS, ARTERIAL
Acid-base deficit: 6.8 mmol/L — ABNORMAL HIGH (ref 0.0–2.0)
Bicarbonate: 24.5 mmol/L (ref 20.0–28.0)
FIO2: 1
MECHVT: 450 mL
O2 Saturation: 95.4 %
PEEP: 15 cmH2O
Patient temperature: 37
RATE: 15 resp/min
pCO2 arterial: 79 mmHg (ref 32.0–48.0)
pH, Arterial: 7.1 — CL (ref 7.350–7.450)
pO2, Arterial: 103 mmHg (ref 83.0–108.0)

## 2019-01-06 LAB — MAGNESIUM: Magnesium: 1.9 mg/dL (ref 1.7–2.4)

## 2019-01-06 LAB — BRAIN NATRIURETIC PEPTIDE: B Natriuretic Peptide: 374 pg/mL — ABNORMAL HIGH (ref 0.0–100.0)

## 2019-01-06 LAB — PROTIME-INR
INR: 1.3 — ABNORMAL HIGH (ref 0.8–1.2)
Prothrombin Time: 16 seconds — ABNORMAL HIGH (ref 11.4–15.2)

## 2019-01-06 LAB — SARS CORONAVIRUS 2 BY RT PCR (HOSPITAL ORDER, PERFORMED IN ~~LOC~~ HOSPITAL LAB): SARS Coronavirus 2: NEGATIVE

## 2019-01-06 LAB — PHOSPHORUS: Phosphorus: 5.1 mg/dL — ABNORMAL HIGH (ref 2.5–4.6)

## 2019-01-06 LAB — INFLUENZA PANEL BY PCR (TYPE A & B)
Influenza A By PCR: NEGATIVE
Influenza B By PCR: NEGATIVE

## 2019-01-06 LAB — TROPONIN I
Troponin I: 0.37 ng/mL (ref <0.03)
Troponin I: 6.58 ng/mL (ref <0.03)

## 2019-01-06 LAB — TSH: TSH: 6.022 u[IU]/mL — ABNORMAL HIGH (ref 0.350–4.500)

## 2019-01-06 LAB — APTT: aPTT: 36 seconds (ref 24–36)

## 2019-01-06 LAB — HEPARIN LEVEL (UNFRACTIONATED): Heparin Unfractionated: 0.12 IU/mL — ABNORMAL LOW (ref 0.30–0.70)

## 2019-01-06 LAB — MRSA PCR SCREENING: MRSA by PCR: NEGATIVE

## 2019-01-06 MED ORDER — ASPIRIN 81 MG PO CHEW
81.0000 mg | CHEWABLE_TABLET | Freq: Every day | ORAL | Status: DC
  Administered 2019-01-06: 81 mg via ORAL
  Filled 2019-01-06: qty 1

## 2019-01-06 MED ORDER — NITROGLYCERIN 2 % TD OINT
0.5000 [in_us] | TOPICAL_OINTMENT | Freq: Once | TRANSDERMAL | Status: AC
  Administered 2019-01-06: 0.5 [in_us] via TOPICAL
  Filled 2019-01-06: qty 1

## 2019-01-06 MED ORDER — MAGNESIUM SULFATE 2 GM/50ML IV SOLN
2.0000 g | INTRAVENOUS | Status: AC
  Administered 2019-01-06: 2 g via INTRAVENOUS

## 2019-01-06 MED ORDER — ACETAMINOPHEN 650 MG RE SUPP: 650.0000 mg | Freq: Four times a day (QID) | RECTAL | Status: DC | PRN

## 2019-01-06 MED ORDER — NOREPINEPHRINE 4 MG/250ML-% IV SOLN
INTRAVENOUS | Status: AC
  Administered 2019-01-06: 4 mg
  Filled 2019-01-06: qty 250

## 2019-01-06 MED ORDER — FLUTICASONE PROPIONATE 50 MCG/ACT NA SUSP: 2.0000 | Freq: Every day | NASAL | Status: DC | PRN

## 2019-01-06 MED ORDER — ENALAPRILAT 1.25 MG/ML IV SOLN
1.2500 mg | Freq: Once | INTRAVENOUS | Status: AC
  Administered 2019-01-06: 1.25 mg via INTRAVENOUS
  Filled 2019-01-06: qty 2

## 2019-01-06 MED ORDER — MIDAZOLAM HCL 2 MG/2ML IJ SOLN
4.0000 mg | INTRAMUSCULAR | Status: AC
  Administered 2019-01-06: 4 mg via INTRAVENOUS

## 2019-01-06 MED ORDER — CLOPIDOGREL BISULFATE 75 MG PO TABS
75.0000 mg | ORAL_TABLET | Freq: Every day | ORAL | Status: DC
  Administered 2019-01-06: 75 mg via ORAL
  Filled 2019-01-06: qty 1

## 2019-01-06 MED ORDER — MIDAZOLAM HCL 2 MG/2ML IJ SOLN
1.0000 mg | INTRAMUSCULAR | Status: DC | PRN
  Administered 2019-01-07: 1 mg via INTRAVENOUS
  Filled 2019-01-06 (×2): qty 2

## 2019-01-06 MED ORDER — ACETAMINOPHEN 325 MG PO TABS: 650.0000 mg | ORAL_TABLET | Freq: Four times a day (QID) | ORAL | Status: DC | PRN

## 2019-01-06 MED ORDER — INSULIN ASPART 100 UNIT/ML ~~LOC~~ SOLN
0.0000 [IU] | SUBCUTANEOUS | Status: DC
  Administered 2019-01-06: 1 [IU] via SUBCUTANEOUS
  Administered 2019-01-06 (×2): 2 [IU] via SUBCUTANEOUS
  Administered 2019-01-06: 5 [IU] via SUBCUTANEOUS
  Administered 2019-01-07 (×4): 1 [IU] via SUBCUTANEOUS
  Filled 2019-01-06 (×8): qty 1

## 2019-01-06 MED ORDER — MIDAZOLAM HCL 2 MG/2ML IJ SOLN
1.0000 mg | INTRAMUSCULAR | Status: DC | PRN
  Administered 2019-01-06: 1 mg via INTRAVENOUS

## 2019-01-06 MED ORDER — LUTEIN 20 MG PO TABS: 1.0000 | ORAL_TABLET | Freq: Every day | ORAL | Status: DC

## 2019-01-06 MED ORDER — FUROSEMIDE 10 MG/ML IJ SOLN
40.0000 mg | Freq: Once | INTRAMUSCULAR | Status: AC
  Administered 2019-01-06: 40 mg via INTRAVENOUS
  Filled 2019-01-06: qty 4

## 2019-01-06 MED ORDER — FUROSEMIDE 10 MG/ML IJ SOLN
INTRAMUSCULAR | Status: AC
  Administered 2019-01-06: 40 mg via INTRAVENOUS
  Filled 2019-01-06: qty 4

## 2019-01-06 MED ORDER — ATORVASTATIN CALCIUM 20 MG PO TABS
20.0000 mg | ORAL_TABLET | Freq: Every day | ORAL | Status: DC
  Administered 2019-01-06: 20 mg
  Filled 2019-01-06: qty 1

## 2019-01-06 MED ORDER — VECURONIUM BROMIDE 10 MG IV SOLR
10.0000 mg | INTRAVENOUS | Status: AC
  Administered 2019-01-06: 10 mg via INTRAVENOUS

## 2019-01-06 MED ORDER — SODIUM CHLORIDE 0.9 % IV SOLN
Freq: Once | INTRAVENOUS | Status: AC
  Administered 2019-01-06: 10 mL/h via INTRAVENOUS

## 2019-01-06 MED ORDER — HEPARIN (PORCINE) 25000 UT/250ML-% IV SOLN
850.0000 [IU]/h | INTRAVENOUS | Status: DC
  Administered 2019-01-06: 06:00:00 850 [IU]/h via INTRAVENOUS
  Filled 2019-01-06: qty 250

## 2019-01-06 MED ORDER — ACETAMINOPHEN 325 MG PO TABS
650.0000 mg | ORAL_TABLET | Freq: Four times a day (QID) | ORAL | Status: DC | PRN
  Administered 2019-01-07: 650 mg
  Filled 2019-01-06: qty 2

## 2019-01-06 MED ORDER — AMIODARONE HCL IN DEXTROSE 360-4.14 MG/200ML-% IV SOLN
INTRAVENOUS | Status: AC
  Filled 2019-01-06: qty 200

## 2019-01-06 MED ORDER — POTASSIUM CHLORIDE 20 MEQ/15ML (10%) PO SOLN
40.0000 meq | ORAL | Status: AC
  Administered 2019-01-06 (×2): 40 meq
  Filled 2019-01-06 (×2): qty 30

## 2019-01-06 MED ORDER — ORAL CARE MOUTH RINSE
15.0000 mL | Freq: Two times a day (BID) | OROMUCOSAL | Status: DC
  Administered 2019-01-06: 15 mL via OROMUCOSAL

## 2019-01-06 MED ORDER — HEPARIN BOLUS VIA INFUSION
4000.0000 [IU] | Freq: Once | INTRAVENOUS | Status: DC
  Filled 2019-01-06: qty 4000

## 2019-01-06 MED ORDER — CHLORHEXIDINE GLUCONATE 0.12% ORAL RINSE (MEDLINE KIT)
15.0000 mL | Freq: Two times a day (BID) | OROMUCOSAL | Status: DC
  Administered 2019-01-06 – 2019-01-07 (×3): 15 mL via OROMUCOSAL

## 2019-01-06 MED ORDER — ONDANSETRON HCL 4 MG PO TABS: 4.0000 mg | ORAL_TABLET | Freq: Four times a day (QID) | ORAL | Status: DC | PRN

## 2019-01-06 MED ORDER — MIDAZOLAM HCL 2 MG/2ML IJ SOLN
INTRAMUSCULAR | Status: AC
  Administered 2019-01-06: 4 mg via INTRAVENOUS
  Filled 2019-01-06: qty 4

## 2019-01-06 MED ORDER — LOSARTAN POTASSIUM 50 MG PO TABS
50.0000 mg | ORAL_TABLET | Freq: Every day | ORAL | Status: DC
  Administered 2019-01-06: 50 mg via ORAL
  Filled 2019-01-06: qty 1

## 2019-01-06 MED ORDER — ATORVASTATIN CALCIUM 20 MG PO TABS: 20.0000 mg | ORAL_TABLET | Freq: Every day | ORAL | Status: DC

## 2019-01-06 MED ORDER — ASPIRIN 81 MG PO CHEW
CHEWABLE_TABLET | ORAL | Status: AC
  Filled 2019-01-06: qty 4

## 2019-01-06 MED ORDER — ISOSORBIDE MONONITRATE ER 30 MG PO TB24
30.0000 mg | ORAL_TABLET | Freq: Every day | ORAL | Status: DC
  Administered 2019-01-06: 30 mg via ORAL
  Filled 2019-01-06: qty 1

## 2019-01-06 MED ORDER — HEPARIN (PORCINE) 25000 UT/250ML-% IV SOLN
1200.0000 [IU]/h | INTRAVENOUS | Status: DC
  Administered 2019-01-06: 1150 [IU]/h via INTRAVENOUS
  Administered 2019-01-07: 07:00:00 1200 [IU]/h via INTRAVENOUS
  Filled 2019-01-06: qty 250

## 2019-01-06 MED ORDER — EZETIMIBE 10 MG PO TABS
10.0000 mg | ORAL_TABLET | Freq: Every day | ORAL | Status: DC
  Administered 2019-01-06: 22:00:00 10 mg
  Filled 2019-01-06 (×2): qty 1

## 2019-01-06 MED ORDER — FENTANYL CITRATE (PF) 100 MCG/2ML IJ SOLN
100.0000 ug | INTRAMUSCULAR | Status: AC
  Administered 2019-01-06: 100 ug via INTRAVENOUS

## 2019-01-06 MED ORDER — ONDANSETRON HCL 4 MG/2ML IJ SOLN: 4.0000 mg | Freq: Four times a day (QID) | INTRAMUSCULAR | Status: DC | PRN

## 2019-01-06 MED ORDER — FENTANYL 2500MCG IN NS 250ML (10MCG/ML) PREMIX INFUSION
25.0000 ug/h | INTRAVENOUS | Status: DC
  Administered 2019-01-06: 50 ug/h via INTRAVENOUS
  Administered 2019-01-07 (×2): 200 ug/h via INTRAVENOUS
  Filled 2019-01-06 (×3): qty 250

## 2019-01-06 MED ORDER — CARVEDILOL 6.25 MG PO TABS
6.2500 mg | ORAL_TABLET | Freq: Two times a day (BID) | ORAL | Status: DC
  Administered 2019-01-06: 6.25 mg via ORAL
  Filled 2019-01-06: qty 1

## 2019-01-06 MED ORDER — CLOPIDOGREL BISULFATE 75 MG PO TABS
75.0000 mg | ORAL_TABLET | Freq: Every day | ORAL | Status: DC
  Administered 2019-01-07: 75 mg
  Filled 2019-01-06: qty 1

## 2019-01-06 MED ORDER — EZETIMIBE 10 MG PO TABS
10.0000 mg | ORAL_TABLET | Freq: Every day | ORAL | Status: DC
  Filled 2019-01-06: qty 1

## 2019-01-06 MED ORDER — FENTANYL CITRATE (PF) 100 MCG/2ML IJ SOLN
INTRAMUSCULAR | Status: AC
  Administered 2019-01-06: 14:00:00 100 ug via INTRAVENOUS
  Filled 2019-01-06: qty 4

## 2019-01-06 MED ORDER — PANTOPRAZOLE SODIUM 40 MG PO TBEC
40.0000 mg | DELAYED_RELEASE_TABLET | Freq: Every day | ORAL | Status: DC
  Administered 2019-01-06: 40 mg via ORAL
  Filled 2019-01-06: qty 1

## 2019-01-06 MED ORDER — MAGNESIUM SULFATE 2 GM/50ML IV SOLN
INTRAVENOUS | Status: AC
  Administered 2019-01-06: 2 g via INTRAVENOUS
  Filled 2019-01-06: qty 50

## 2019-01-06 MED ORDER — DOCUSATE SODIUM 100 MG PO CAPS
100.0000 mg | ORAL_CAPSULE | Freq: Two times a day (BID) | ORAL | Status: DC
  Administered 2019-01-06: 100 mg via ORAL
  Filled 2019-01-06: qty 1

## 2019-01-06 MED ORDER — ORAL CARE MOUTH RINSE
15.0000 mL | OROMUCOSAL | Status: DC
  Administered 2019-01-06 – 2019-01-07 (×10): 15 mL via OROMUCOSAL

## 2019-01-06 MED ORDER — FUROSEMIDE 20 MG PO TABS
20.0000 mg | ORAL_TABLET | Freq: Every day | ORAL | Status: DC
  Administered 2019-01-06: 20 mg via ORAL
  Filled 2019-01-06: qty 1

## 2019-01-06 MED ORDER — HEPARIN SODIUM (PORCINE) 5000 UNIT/ML IJ SOLN
4000.0000 [IU] | Freq: Once | INTRAMUSCULAR | Status: DC
  Administered 2019-01-06: 4000 [IU] via INTRAVENOUS
  Filled 2019-01-06: qty 1

## 2019-01-06 MED ORDER — AMLODIPINE BESYLATE 5 MG PO TABS: 5.0000 mg | ORAL_TABLET | Freq: Every day | ORAL | Status: DC

## 2019-01-06 MED ORDER — NOREPINEPHRINE 16 MG/250ML-% IV SOLN
0.0000 ug/min | INTRAVENOUS | Status: DC
  Administered 2019-01-06: 21:00:00 8 ug/min via INTRAVENOUS
  Filled 2019-01-06 (×3): qty 250

## 2019-01-06 MED ORDER — FENTANYL BOLUS VIA INFUSION
25.0000 ug | INTRAVENOUS | Status: DC | PRN
  Administered 2019-01-06 – 2019-01-07 (×3): 25 ug via INTRAVENOUS
  Filled 2019-01-06: qty 25

## 2019-01-06 MED ORDER — PANTOPRAZOLE SODIUM 40 MG IV SOLR
40.0000 mg | INTRAVENOUS | Status: DC
  Administered 2019-01-06: 40 mg via INTRAVENOUS
  Filled 2019-01-06: qty 40

## 2019-01-06 MED ORDER — DOCUSATE SODIUM 50 MG/5ML PO LIQD
100.0000 mg | Freq: Two times a day (BID) | ORAL | Status: DC
  Administered 2019-01-06 – 2019-01-07 (×2): 100 mg via ORAL
  Filled 2019-01-06 (×2): qty 10

## 2019-01-06 MED ORDER — ASPIRIN 81 MG PO CHEW
324.0000 mg | CHEWABLE_TABLET | Freq: Once | ORAL | Status: AC
  Administered 2019-01-06: 324 mg via ORAL

## 2019-01-06 MED ORDER — FUROSEMIDE 10 MG/ML IJ SOLN
40.0000 mg | Freq: Once | INTRAMUSCULAR | Status: AC
  Administered 2019-01-06: 40 mg via INTRAVENOUS

## 2019-01-06 MED ORDER — VECURONIUM BROMIDE 10 MG IV SOLR
INTRAVENOUS | Status: AC
  Administered 2019-01-06: 14:00:00 10 mg via INTRAVENOUS
  Filled 2019-01-06: qty 10

## 2019-01-06 MED ORDER — ASPIRIN 81 MG PO CHEW
81.0000 mg | CHEWABLE_TABLET | Freq: Every day | ORAL | Status: DC
  Administered 2019-01-07: 81 mg
  Filled 2019-01-06: qty 1

## 2019-01-06 MED ORDER — POTASSIUM CHLORIDE CRYS ER 20 MEQ PO TBCR
40.0000 meq | EXTENDED_RELEASE_TABLET | Freq: Once | ORAL | Status: AC
  Administered 2019-01-06: 40 meq via ORAL
  Filled 2019-01-06: qty 2

## 2019-01-06 MED ORDER — FENTANYL CITRATE (PF) 100 MCG/2ML IJ SOLN: 25.0000 ug | Freq: Once | INTRAMUSCULAR | Status: DC

## 2019-01-06 MED ORDER — LORATADINE 10 MG PO TABS
10.0000 mg | ORAL_TABLET | Freq: Every day | ORAL | Status: DC
  Administered 2019-01-06: 10 mg via ORAL
  Filled 2019-01-06: qty 1

## 2019-01-06 NOTE — ED Notes (Signed)
Patient denies pain and is resting comfortably.  

## 2019-01-06 NOTE — Procedures (Signed)
Central Venous Catheter Placement:TRIPLE LUMEN Indication: Patient receiving vesicant or irritant drug.; Patient receiving intravenous therapy for longer than 5 days.; Patient has limited or no vascular access.   Consent:emergent    Hand washing performed prior to starting the procedure.   Procedure:   An active timeout was performed and correct patient, name, & ID confirmed.   Patient was positioned correctly for central venous access.  Patient was prepped using strict sterile technique including chlorohexadine preps, sterile drape, sterile gown and sterile gloves.    The area was prepped, draped and anesthetized in the usual sterile manner. Patient comfort was obtained.    A triple lumen catheter was placed in LEFT FEMORAL  Vein There was good blood return, catheter caps were placed on lumens, catheter flushed easily, the line was secured and a sterile dressing and BIO-PATCH applied.   Ultrasound was used to visualize vasculature and guidance of needle.   Number of Attempts: 1 Complications:none Estimated Blood Loss: none Operator: Stanly Si.   Lucie Leather, M.D.  [facility]  Medical Director [facility] Medical Director [facility] Cardio-Pulmonary Department

## 2019-01-06 NOTE — ED Notes (Signed)
Pt is having a hard time breathing, MD notified. Respiratory was called to place the Pt on BIPAP.

## 2019-01-06 NOTE — Consult Note (Addendum)
ANTICOAGULATION CONSULT NOTE - Follow Up Consult  Pharmacy Consult for Heparin Infusion  Indication: chest pain/ACS  Allergies  Allergen Reactions  . Codeine Nausea And Vomiting   Patient Measurements: Height: 5' 11.5" (181.6 cm) Weight: 156 lb 15.5 oz (71.2 kg) IBW/kg (Calculated) : 76.45 Heparin DW 71.2 kg  Vital Signs: Temp: 96.4 F (35.8 C) 01/29/2023 1200) Temp Source: Axillary Jan 29, 2023 1200) BP: 104/66 Jan 29, 2023 1200) Pulse Rate: 114 2023-01-29 1200)  Labs: Recent Labs    29-Jan-2019 0527 29-Jan-2019 1356  HGB 12.5* 11.7*  HCT 38.7* 37.3*  PLT 275 233  APTT 36  --   LABPROT 16.0*  --   INR 1.3*  --   HEPARINUNFRC  --  0.12*  CREATININE 1.66*  --   TROPONINI 0.37*  --     Estimated Creatinine Clearance: 37.5 mL/min (A) (by C-G formula based on SCr of 1.66 mg/dL (H)).  Assessment: Pharmacy consulted for heparin dosing and monitoring for 78yo male admitted with ACS/STEMI. No reported anticoagulants PTA.  2023-01-29 Patient coded this afternoon - delay in heparin therapy adjustment. Patient is now intubated.  Goal of Therapy:  Heparin level 0.3-0.7 units/ml Monitor platelets by anticoagulation protocol: Yes   Plan:  2023-01-29 HL @ 1356 0.12 After discussion with CCM, will not bolus and will increase infusion from 850 units/hr to 1150 units/hr as the level was drawn around the time the code started. Heparin was stopped during code.  Check anti-Xa level in 8 hours (4/17 @ 0030) and daily while on heparin Continue to monitor H&H and platelets.    Mauri Reading, PharmD Pharmacy Resident  01/29/19 3:32 PM

## 2019-01-06 NOTE — Consult Note (Signed)
ANTICOAGULATION CONSULT NOTE - Follow Up Consult  Pharmacy Consult for Heparin Infusion  Indication: chest pain/ACS  Allergies  Allergen Reactions  . Codeine Nausea And Vomiting   Patient Measurements: Height: 5\' 11"  (180.3 cm) Weight: 158 lb (71.7 kg) IBW/kg (Calculated) : 75.3  Vital Signs: Temp: 97.5 F (36.4 C) (04/16 0529) Temp Source: Oral (04/16 0529) BP: 153/88 (04/16 0529) Pulse Rate: 82 (04/16 0529)  Labs: Recent Labs    01/01/2019 0527  HGB 12.5*  HCT 38.7*  PLT 275  CREATININE 1.66*  TROPONINI 0.37*    Estimated Creatinine Clearance: 37.8 mL/min (A) (by C-G formula based on SCr of 1.66 mg/dL (H)).  Assessment: Pharmacy consulted for heparin dosing and monitoring for 78yo male admitted with ACS/STEMI. No reported anticoagulants PTA.   Goal of Therapy:  Heparin level 0.3-0.7 units/ml Monitor platelets by anticoagulation protocol: Yes   Plan:  Baseline labs ordered  4000 units bolus x 1 already received  Start heparin infusion at 850 units/hr Check anti-Xa level in 8 hours and daily while on heparin Continue to monitor H&H and platelets  Gardner Candle, PharmD, BCPS Clinical Pharmacist 01/15/2019 6:09 AM

## 2019-01-06 NOTE — Progress Notes (Signed)
Bipap initiated. Dyspnea & O2 sat improved markedly thereafter. Pt tolerating well.

## 2019-01-06 NOTE — Progress Notes (Signed)
CRITICAL CARE NOTE  CC  Acute cardiac arrest  SUBJECTIVE Patient remains critically ill Prognosis is guarded Patient emergently intubated   S/p cardiac arrest Torsades on monitor then increased WOB then intubated CPR for 12 minutes     SIGNIFICANT EVENTS 4/15 admitted for NSTEMI and CHF exacerbation 4/16 cardiac arrest acute MI 4/16 intubated, CVL placed  REVIEW OF SYSTEMS  PATIENT IS UNABLE TO PROVIDE COMPLETE REVIEW OF SYSTEMS DUE TO SEVERE CRITICAL ILLNESS   PHYSICAL EXAMINATION:  GENERAL:critically ill appearing, +resp distress HEAD: Normocephalic, atraumatic.  EYES: Pupils equal, round, reactive to light.  No scleral icterus.  MOUTH: Moist mucosal membrane. NECK: Supple. No thyromegaly. No nodules. No JVD.  PULMONARY: +rhonchi, +wheezing CARDIOVASCULAR: S1 and S2. Regular rate and rhythm. No murmurs, rubs, or gallops.  GASTROINTESTINAL: Soft, nontender, -distended. No masses. Positive bowel sounds. No hepatosplenomegaly.  MUSCULOSKELETAL: No swelling, clubbing, or edema.  NEUROLOGIC: obtunded, GCS<8 SKIN:intact,warm,dry    CULTURE RESULTS   Recent Results (from the past 240 hour(s))  SARS Coronavirus 2 [facility](Hospital order, Performed in [facility] hospital lab)     Status: None   Collection Time: 12/22/2018  9:02 AM  Result Value Ref Range Status   SARS Coronavirus 2 NEGATIVE NEGATIVE Final    Comment: (NOTE) If result is NEGATIVE SARS-CoV-2 target nucleic acids are NOT DETECTED. The SARS-CoV-2 RNA is generally detectable in upper and lower  respiratory specimens during the acute phase of infection. The lowest  concentration of SARS-CoV-2 viral copies this assay can detect is 250  copies / mL. A negative result does not preclude SARS-CoV-2 infection  and should not be used as the sole basis for treatment or other  patient management decisions.  A negative result may occur with  improper specimen collection / handling, submission of specimen other  than  nasopharyngeal swab, presence of viral mutation(s) within the  areas targeted by this assay, and inadequate number of viral copies  (<250 copies / mL). A negative result must be combined with clinical  observations, patient history, and epidemiological information. If result is POSITIVE SARS-CoV-2 target nucleic acids are DETECTED. The SARS-CoV-2 RNA is generally detectable in upper and lower  respiratory specimens dur ing the acute phase of infection.  Positive  results are indicative of active infection with SARS-CoV-2.  Clinical  correlation with patient history and other diagnostic information is  necessary to determine patient infection status.  Positive results do  not rule out bacterial infection or co-infection with other viruses. If result is PRESUMPTIVE POSTIVE SARS-CoV-2 nucleic acids MAY BE PRESENT.   A presumptive positive result was obtained on the submitted specimen  and confirmed on repeat testing.  While 2019 novel coronavirus  (SARS-CoV-2) nucleic acids may be present in the submitted sample  additional confirmatory testing may be necessary for epidemiological  and / or clinical management purposes  to differentiate between  SARS-CoV-2 and other Sarbecovirus currently known to infect humans.  If clinically indicated additional testing with an alternate test  methodology [id number]) is advised. The SARS-CoV-2 RNA is generally  detectable in upper and lower respiratory sp ecimens during the acute  phase of infection. The expected result is Negative. Fact Sheet for Patients:  BoilerBrush.com.cyhttps://www.fda.gov/media/136312/download Fact Sheet for Healthcare Providers: https://pope.com/https://www.fda.gov/media/136313/download This test is not yet approved or cleared by the [place] FDA and has been authorized for detection and/or diagnosis of SARS-CoV-2 by FDA under an Emergency Use Authorization (EUA).  This EUA will remain in effect (meaning this test can be used) for the  duration of  the COVID-19 declaration under Section 564(b)(1) of the Act, 21 U.S.C. section 360bbb-3(b)(1), unless the authorization is terminated or revoked sooner. Performed at [facility], [address] [place], [place] [address]   MRSA PCR Screening     Status: None   Collection Time: 01-31-19  9:02 AM  Result Value Ref Range Status   MRSA by PCR NEGATIVE NEGATIVE Final    Comment:        The GeneXpert MRSA Assay (FDA approved for NASAL specimens only), is one component of a comprehensive MRSA colonization surveillance program. It is not intended to diagnose MRSA infection nor to guide or monitor treatment for MRSA infections. Performed at [facility], [address], [place], [place] [address]           IMAGING    Dg Chest Portable 1 View  Result Date: Jan 31, 2019 CLINICAL DATA:  Chest pain and shortness of breath. Congestive heart failure. EXAM: PORTABLE CHEST 1 VIEW COMPARISON:  One-view chest x-ray 08/24/2018. FINDINGS: Heart is enlarged. Aortic atherosclerosis is present. There is mild diffuse interstitial prominence. No significant consolidated airspace disease is present. Minimal bibasilar atelectasis is evident. IMPRESSION: 1. Cardiomegaly with diffuse mild edema consistent with congestive heart failure. 2. Minimal bibasilar airspace disease likely reflects atelectasis. Electronically Signed   By: Marin Roberts M.D.   On: Jan 31, 2019 06:14      Indwelling Urinary Catheter continued, requirement due to   Reason to continue Indwelling Urinary Catheter for strict Intake/Output monitoring for hemodynamic instability         Ventilator continued, requirement due to, resp failure    Ventilator Sedation RASS 0 to -2     ASSESSMENT AND PLAN SYNOPSIS   Severe ACUTE Hypoxic and Hypercapnic Respiratory Failure from acute MI and cardiogenic shock -continue Full MV support   CARDIAC FAILURE-acute MI Cardiac arrest -follow up cardiology  recs    NEUROLOGY - intubated and sedated - minimal sedation to achieve a RASS goal: -1   Cardiogenic shock -use vasopressors to keep MAP>65  CARDIAC ICU monitoring   GI/Nutrition GI PROPHYLAXIS as indicated DIET-->TF's on hold Constipation protocol as indicated  ENDO - ICU hypoglycemic\Hyperglycemia protocol -check FSBS per protocol   ELECTROLYTES -follow labs as needed -replace as needed -pharmacy consultation and following   DVT/GI PRX ordered TRANSFUSIONS AS NEEDED MONITOR FSBS ASSESS the need for LABS as needed   Critical Care Time devoted to patient care services described in this note is 45 minutes.   Overall, patient is critically ill, prognosis is guarded.  Patient with Multiorgan failure and at high risk for cardiac arrest and death.  Family has been updated prognosis is very poor      Lucie Leather, M.D.  [facility]  Medical Director [facility] Medical Director [facility] Cardio-Pulmonary Department

## 2019-01-06 NOTE — Plan of Care (Signed)
Pt admitted to ICU 7, oriented to room, A&O x4, verbalizes understanding of POC and diagnostic testing, 12 liters HFNC to wall.  Stands at bedside to use urinal.

## 2019-01-06 NOTE — H&P (Signed)
Reginald Franklin is an 78 y.o. male.   Chief Complaint: Chest pain HPI: The patient with past medical history of CHF, CAD status post CABG, diabetes, hyperlipidemia and stroke presents to the emergency department complaining of chest pain.  He awoke with the pain this morning.  Is associated with shortness of breath.  In the emergency department chest x-ray showed pulmonary edema but the patient reports a dry cough.  Oxygen saturations were initially in the low 80s on 6 L of oxygen via nasal cannula.  The patient eventually required BiPAP ventilation.  He has remained conscious and alert throughout emergency department evaluation.  Lasix 40 mg IV was administered to good effect.  He received 324 mg of chewable aspirin and Nitropaste was applied to his chest.  Troponin was found to be elevated and the patient was started on a heparin drip.  Also patient was stabilized emergency department staff called the hospitalist service for admission.  Past Medical History:  Diagnosis Date  . Anginal pain (HCC)   . BPH (benign prostatic hyperplasia)   . CHF (congestive heart failure) (HCC)   . Coronary artery disease   . Diabetes mellitus without complication [facility](HCC)    Patient takes Metformin.  . Erosive gastropathy   . Gastritis   . Hemorrhoids   . Hiatal hernia   . Hyperlipidemia   . Hypertension   . Nasal polyp   . Peripheral vascular disease (HCC)   . S/P CABG x 3   . Seasonal allergies   . Stroke [facility](HCC)     Past Surgical History:  Procedure Laterality Date  . CARDIAC CATHETERIZATION N/A 06/10/2016   Procedure: Coronary/Graft Angiography;  Surgeon: Dalia HeadingKenneth A Fath, MD;  Location: [facility] INVASIVE CV LAB;  Service: Cardiovascular;  Laterality: N/A;  . CARDIAC CATHETERIZATION N/A 06/10/2016   Procedure: Coronary Stent Intervention;  Surgeon: Marcina MillardAlexander Paraschos, MD;  Location: [facility] INVASIVE CV LAB;  Service: Cardiovascular;  Laterality: N/A;  . CAROTID ENDARTERECTOMY    . CORONARY ARTERY BYPASS GRAFT     . CORONARY STENT INTERVENTION N/A 08/24/2018   Procedure: CORONARY STENT INTERVENTION;  Surgeon: Yvonne KendallEnd, Christopher, MD;  Location: [facility] INVASIVE CV LAB;  Service: Cardiovascular;  Laterality: N/A;  . HERNIA REPAIR    . LEFT HEART CATH AND CORS/GRAFTS ANGIOGRAPHY N/A 08/24/2018   Procedure: LEFT HEART CATH AND CORS/GRAFTS ANGIOGRAPHY;  Surgeon: Lamar BlinksKowalski, Bruce J, MD;  Location: [facility] INVASIVE CV LAB;  Service: Cardiovascular;  Laterality: N/A;  . TONSILLECTOMY      History reviewed. No pertinent family history. Social History:  reports that he has never smoked. He has never used smokeless tobacco. He reports that he does not drink alcohol or use drugs.  Allergies:  Allergies  Allergen Reactions  . Codeine Nausea And Vomiting    (Not in a hospital admission)   Results for orders placed or performed during the hospital encounter of 2018-11-25 (from the past 48 hour(s))  CBC with Differential     Status: Abnormal   Collection Time: 2018-11-25  5:27 AM  Result Value Ref Range   WBC 13.6 (H) 4.0 - 10.5 K/uL   RBC 4.13 (L) 4.22 - 5.81 MIL/uL   Hemoglobin 12.5 (L) 13.0 - 17.0 g/dL   HCT 16.138.7 (L) 09.639.0 - 04.552.0 %   MCV 93.7 80.0 - 100.0 fL   MCH 30.3 26.0 - 34.0 pg   MCHC 32.3 30.0 - 36.0 g/dL   RDW 40.914.2 81.111.5 - 91.415.5 %   Platelets 275 150 - 400 K/uL  nRBC 0.0 0.0 - 0.2 %   Neutrophils Relative % 80 %   Neutro Abs 10.8 (H) 1.7 - 7.7 K/uL   Lymphocytes Relative 11 %   Lymphs Abs 1.5 0.7 - 4.0 K/uL   Monocytes Relative 7 %   Monocytes Absolute 1.0 0.1 - 1.0 K/uL   Eosinophils Relative 1 %   Eosinophils Absolute 0.2 0.0 - 0.5 K/uL   Basophils Relative 1 %   Basophils Absolute 0.1 0.0 - 0.1 K/uL   Immature Granulocytes 0 %   Abs Immature Granulocytes 0.05 0.00 - 0.07 K/uL    Comment: Performed at [facility], [address] [place], [place] [address]  Comprehensive metabolic panel     Status: Abnormal   Collection Time: 12/29/2018  5:27 AM  Result Value Ref Range   Sodium 138 135  - 145 mmol/L   Potassium 3.9 3.5 - 5.1 mmol/L   Chloride 101 98 - 111 mmol/L   CO2 22 22 - 32 mmol/L   Glucose, Bld 160 (H) 70 - 99 mg/dL   BUN 32 (H) 8 - 23 mg/dL   Creatinine, Ser 8.46 (H) 0.61 - 1.24 mg/dL   Calcium 9.5 8.9 - 96.2 mg/dL   Total Protein 8.1 6.5 - 8.1 g/dL   Albumin 4.9 3.5 - 5.0 g/dL   AST 25 15 - 41 U/L   ALT 20 0 - 44 U/L   Alkaline Phosphatase 39 38 - 126 U/L   Total Bilirubin 2.4 (H) 0.3 - 1.2 mg/dL   GFR calc non Af Amer 39 (L) >60 mL/min   GFR calc Af Amer 45 (L) >60 mL/min   Anion gap 15 5 - 15    Comment: Performed at [facility], [address] [place], [place] [address]  Troponin I - ONCE - STAT     Status: Abnormal   Collection Time: 12/27/2018  5:27 AM  Result Value Ref Range   Troponin I 0.37 (HH) <0.03 ng/mL    Comment: CRITICAL RESULT CALLED TO, READ BACK BY AND VERIFIED WITH [place] RIVERIA AT [phone] 01/14/2019 SMA Performed at [facility], [address] [place], [place] [address]   Brain natriuretic peptide     Status: Abnormal   Collection Time: 12/28/2018  5:27 AM  Result Value Ref Range   B Natriuretic Peptide 374.0 (H) 0.0 - 100.0 pg/mL    Comment: Performed at [facility], [address] [place], [place] [address]  APTT     Status: None   Collection Time: 01/05/2019  5:27 AM  Result Value Ref Range   aPTT 36 24 - 36 seconds    Comment: Performed at [facility], [address] [place], [place] [address]  Protime-INR     Status: Abnormal   Collection Time: 01/11/2019  5:27 AM  Result Value Ref Range   Prothrombin Time 16.0 (H) 11.4 - 15.2 seconds   INR 1.3 (H) 0.8 - 1.2    Comment: (NOTE) INR goal varies based on device and disease states. Performed at [facility], [address] [place], [place] [address]    Dg Chest Portable 1 View  Result Date: 01/20/2019 CLINICAL DATA:  Chest pain and shortness of breath. Congestive heart failure. EXAM: PORTABLE CHEST 1 VIEW COMPARISON:   One-view chest x-ray 08/24/2018. FINDINGS: Heart is enlarged. Aortic atherosclerosis is present. There is mild diffuse interstitial prominence. No significant consolidated airspace disease is present. Minimal bibasilar atelectasis is evident. IMPRESSION: 1. Cardiomegaly with diffuse mild  edema consistent with congestive heart failure. 2. Minimal bibasilar airspace disease likely reflects atelectasis. Electronically Signed   By: Marin Roberts M.D.   On: 02-04-2019 06:14    Review of Systems  Constitutional: Negative for chills and fever.  HENT: Negative for sore throat and tinnitus.   Eyes: Negative for blurred vision and redness.  Respiratory: Positive for cough and shortness of breath.   Cardiovascular: Positive for chest pain. Negative for palpitations, orthopnea and PND.  Gastrointestinal: Negative for abdominal pain, diarrhea, nausea and vomiting.  Genitourinary: Negative for dysuria, frequency and urgency.  Musculoskeletal: Negative for joint pain and myalgias.  Skin: Negative for rash.       No lesions  Neurological: Negative for speech change, focal weakness and weakness.  Endo/Heme/Allergies: Does not bruise/bleed easily.       No temperature intolerance  Psychiatric/Behavioral: Negative for depression and suicidal ideas.    Blood pressure 135/66, pulse 68, temperature (!) 97.5 F (36.4 C), temperature source Oral, resp. rate (!) 25, height 5\' 11"  (1.803 m), weight 71.7 kg, SpO2 100 %. Physical Exam  Constitutional: He is oriented to person, place, and time. He appears well-developed and well-nourished. No distress.  HENT:  Head: Normocephalic and atraumatic.  Mouth/Throat: Oropharynx is clear and moist.  Eyes: Pupils are equal, round, and reactive to light. Conjunctivae and EOM are normal. No scleral icterus.  Neck: Normal range of motion. Neck supple. No JVD present. No tracheal deviation present. No thyromegaly present.  Respiratory: He is in respiratory distress. He  has rales.  GI: Soft. Bowel sounds are normal. He exhibits no distension. There is no abdominal tenderness.  Genitourinary:    Genitourinary Comments: Deferred   Musculoskeletal: Normal range of motion.        General: No edema.  Lymphadenopathy:    He has no cervical adenopathy.  Neurological: He is alert and oriented to person, place, and time. No cranial nerve deficit.  Skin: Skin is warm and dry. No rash noted. No erythema.  Psychiatric: He has a normal mood and affect. His behavior is normal. Judgment and thought content normal.     Assessment/Plan This is a 78 year old male admitted for NSTEMI. 1.  NSTEMI: Elevated troponin, deepening ST segments in lateral leads and ongoing chest pain.  Continue heparin drip.  Consult cardiology.  Continue to follow cardiac biomarkers 2.  Sepsis: With acute respiratory failure with hypoxia.  Continue BiPAP ventilation.  The patient meets criteria via intermittent tachycardia, persistent tachypnea and leukocytosis.    The patient may have pneumonia. He is afebrile.  No travel risk factors or known contact with individuals with exposure to novel coronavirus, however rule out influenza and COVID-19 nonetheless.  He is hemodynamically stable. 3.  CAD: Unstable; continue aspirin and Plavix 4.  Hypertension: Uncontrolled; continue enalapril 5.  Diabetes mellitus type 2: Hold oral hypoglycemic agents.  Sliding scale insulin while hospitalized 6.  DVT prophylaxis: Therapeutic anticoagulation as above 7.  GI prophylaxis: None The patient is a full code.  I have personally spent 45 minutes of critical care time with this patient   Arnaldo Natal, MD 04-Feb-2019, 6:55 AM

## 2019-01-06 NOTE — TOC Initial Note (Signed)
Transition of Care [facility]) - Initial/Assessment Note    Patient Details  Name: Reginald Franklin MRN: [id number] Date of Birth: [birth year]  [facility] of [facility]) CM/SW Contact:    Chapman Fitch, RN Phone Number: 01/12/2019, 2:12 PM  Clinical Narrative:                 Patient admitted with NSTEMI and sepsis.  Code was recently called on patient and noted in vital signs that patient was in Advance.   CM to complete full assessment when patient is medically stable.    Per chart review patient is from home with wife.  PCP [place].  Patient is followed by the heart failure clinic.   Patient is currently requiring HFNC.          Patient Goals and CMS Choice        Expected Discharge Plan and Services                                    Prior Living Arrangements/Services                       Activities of Daily Living Home Assistive Devices/Equipment: None ADL Screening (condition at time of admission) Patient's cognitive ability adequate to safely complete daily activities?: Yes Is the patient deaf or have difficulty hearing?: No Does the patient have difficulty seeing, even when wearing glasses/contacts?: No Does the patient have difficulty concentrating, remembering, or making decisions?: No Patient able to express need for assistance with ADLs?: Yes Does the patient have difficulty dressing or bathing?: No Independently performs ADLs?: Yes (appropriate for developmental age) Does the patient have difficulty walking or climbing stairs?: No Weakness of Legs: None Weakness of Arms/Hands: None  Permission Sought/Granted                  Emotional Assessment              Admission diagnosis:  Abnormal EKG [R94.31] Elevated troponin [R79.89] Chest pain, unspecified type [R07.9] Acute congestive heart failure, unspecified heart failure type [facility]) [I50.9] Patient Active Problem List   Diagnosis Date Noted  . NSTEMI (non-ST elevated  myocardial infarction) (HCC) 07-Jan-2019  . Acute on chronic systolic CHF (congestive heart failure) (HCC) 08/24/2018  . Non-ST elevation (NSTEMI) myocardial infarction (HCC)   . CKD (chronic kidney disease) stage 3, GFR 30-59 ml/min (HCC) 08/10/2018  . Congestive heart failure (CHF) (HCC) 06/09/2018  . Benign prostatic hyperplasia 07/03/2016  . Environmental allergies 07/03/2016  . Angina, class I (HCC) 07/03/2016  . History of nasal polyp 07/03/2016  . Hyperlipidemia, unspecified 07/03/2016  . Hypertension 07/03/2016  . PVD (peripheral vascular disease) (HCC) 07/03/2016  . Coronary artery disease 06/10/2016  . CAD (coronary artery disease) 06/10/2016  . Unstable angina (HCC) 06/10/2016  . 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 Anemia 04/25/2014  . Diabetes (HCC) 04/25/2014   PCP:  Marguarite Arbour, MD Pharmacy:   [facility] [place], [place] - [address] [place] [address] Phone: [phone] Fax: [phone]     Social Determinants of Health (SDOH) Interventions    Readmission Risk Interventions Readmission Risk Prevention Plan Jan 07, 2019  Medication Review (RN Care Manager) Complete  Some recent data might be hidden

## 2019-01-06 NOTE — ED Notes (Addendum)
Lab called with a critical troponin of 0.37 Dr. Darnelle Catalan was notified.

## 2019-01-06 NOTE — Consult Note (Signed)
Reason for Consult: Congestive heart failure respiratory failure hypoxemia Referring Physician: Dr. Joycelyn RuaMichael Diamond hospitalist Cardiologist Dr Gerda DissFath  [name] is an 78 y.o. male.  HPI: Presents with progressive dyspnea shortness of breath known history of congestive heart failure cardiomyopathy coronary disease coronary bypass surgery diabetes hyperlipidemia CVA.  Became hypoxic so came to the emergency room and was subsequently admitted to ICU.  Patient has significant improvement with BiPAP and supplemental oxygen patient also received IV Lasix therapy for heart failure and feels much better denies any significant chest pain symptoms continued for several days prior to presenting.  Past Medical History:  Diagnosis Date  . Anginal pain (HCC)   . BPH (benign prostatic hyperplasia)   . CHF (congestive heart failure) (HCC)   . Coronary artery disease   . Diabetes mellitus without complication [facility](HCC)    Patient takes Metformin.  . Erosive gastropathy   . Gastritis   . Hemorrhoids   . Hiatal hernia   . Hyperlipidemia   . Hypertension   . Nasal polyp   . Peripheral vascular disease (HCC)   . S/P CABG x 3   . Seasonal allergies   . Stroke [facility](HCC)     Past Surgical History:  Procedure Laterality Date  . CARDIAC CATHETERIZATION N/A 06/10/2016   Procedure: Coronary/Graft Angiography;  Surgeon: Dalia HeadingKenneth A Fath, MD;  Location: [facility] INVASIVE CV LAB;  Service: Cardiovascular;  Laterality: N/A;  . CARDIAC CATHETERIZATION N/A 06/10/2016   Procedure: Coronary Stent Intervention;  Surgeon: Marcina MillardAlexander Paraschos, MD;  Location: [facility] INVASIVE CV LAB;  Service: Cardiovascular;  Laterality: N/A;  . CAROTID ENDARTERECTOMY    . CORONARY ARTERY BYPASS GRAFT    . CORONARY STENT INTERVENTION N/A 08/24/2018   Procedure: CORONARY STENT INTERVENTION;  Surgeon: Yvonne KendallEnd, Christopher, MD;  Location: [facility] INVASIVE CV LAB;  Service: Cardiovascular;  Laterality: N/A;  . HERNIA REPAIR    . LEFT HEART CATH AND  CORS/GRAFTS ANGIOGRAPHY N/A 08/24/2018   Procedure: LEFT HEART CATH AND CORS/GRAFTS ANGIOGRAPHY;  Surgeon: Lamar BlinksKowalski, Bruce J, MD;  Location: [facility] INVASIVE CV LAB;  Service: Cardiovascular;  Laterality: N/A;  . TONSILLECTOMY      History reviewed. No pertinent family history.  Social History:  reports that he has never smoked. He has never used smokeless tobacco. He reports that he does not drink alcohol or use drugs.  Allergies:  Allergies  Allergen Reactions  . Codeine Nausea And Vomiting    Medications: I have reviewed the patient's current medications.  Results for orders placed or performed during the hospital encounter of 01/09/2019 (from the past 48 hour(s))  CBC with Differential     Status: Abnormal   Collection Time: 12/28/2018  5:27 AM  Result Value Ref Range   WBC 13.6 (H) 4.0 - 10.5 K/uL   RBC 4.13 (L) 4.22 - 5.81 MIL/uL   Hemoglobin 12.5 (L) 13.0 - 17.0 g/dL   HCT 16.138.7 (L) 09.639.0 - 04.552.0 %   MCV 93.7 80.0 - 100.0 fL   MCH 30.3 26.0 - 34.0 pg   MCHC 32.3 30.0 - 36.0 g/dL   RDW 40.914.2 81.111.5 - 91.415.5 %   Platelets 275 150 - 400 K/uL   nRBC 0.0 0.0 - 0.2 %   Neutrophils Relative % 80 %   Neutro Abs 10.8 (H) 1.7 - 7.7 K/uL   Lymphocytes Relative 11 %   Lymphs Abs 1.5 0.7 - 4.0 K/uL   Monocytes Relative 7 %   Monocytes Absolute 1.0 0.1 - 1.0 K/uL   Eosinophils  Relative 1 %   Eosinophils Absolute 0.2 0.0 - 0.5 K/uL   Basophils Relative 1 %   Basophils Absolute 0.1 0.0 - 0.1 K/uL   Immature Granulocytes 0 %   Abs Immature Granulocytes 0.05 0.00 - 0.07 K/uL    Comment: Performed at [facility], [address] [place], [place] [address]  Comprehensive metabolic panel     Status: Abnormal   Collection Time: Jan 10, 2019  5:27 AM  Result Value Ref Range   Sodium 138 135 - 145 mmol/L   Potassium 3.9 3.5 - 5.1 mmol/L   Chloride 101 98 - 111 mmol/L   CO2 22 22 - 32 mmol/L   Glucose, Bld 160 (H) 70 - 99 mg/dL   BUN 32 (H) 8 - 23 mg/dL   Creatinine, Ser 6.96 (H) 0.61 -  1.24 mg/dL   Calcium 9.5 8.9 - 29.5 mg/dL   Total Protein 8.1 6.5 - 8.1 g/dL   Albumin 4.9 3.5 - 5.0 g/dL   AST 25 15 - 41 U/L   ALT 20 0 - 44 U/L   Alkaline Phosphatase 39 38 - 126 U/L   Total Bilirubin 2.4 (H) 0.3 - 1.2 mg/dL   GFR calc non Af Amer 39 (L) >60 mL/min   GFR calc Af Amer 45 (L) >60 mL/min   Anion gap 15 5 - 15    Comment: Performed at [facility], [address] [place], [place] [address]  Troponin I - ONCE - STAT     Status: Abnormal   Collection Time: 2019-01-10  5:27 AM  Result Value Ref Range   Troponin I 0.37 (HH) <0.03 ng/mL    Comment: CRITICAL RESULT CALLED TO, READ BACK BY AND VERIFIED WITH Perry RIVERIA AT 2440 01/10/19 SMA Performed at [facility], [address], [place], [place] [address]   Brain natriuretic peptide     Status: Abnormal   Collection Time: 10-Jan-2019  5:27 AM  Result Value Ref Range   B Natriuretic Peptide 374.0 (H) 0.0 - 100.0 pg/mL    Comment: Performed at [facility], [address] [place], [place] [address]  APTT     Status: None   Collection Time: 10-Jan-2019  5:27 AM  Result Value Ref Range   aPTT 36 24 - 36 seconds    Comment: Performed at [facility], [address] [place], [place] [address]  Protime-INR     Status: Abnormal   Collection Time: 2019/01/10  5:27 AM  Result Value Ref Range   Prothrombin Time 16.0 (H) 11.4 - 15.2 seconds   INR 1.3 (H) 0.8 - 1.2    Comment: (NOTE) INR goal varies based on device and disease states. Performed at [facility], [address] [place], [place] [address]   TSH     Status: Abnormal   Collection Time: 01/10/19  5:27 AM  Result Value Ref Range   TSH 6.022 (H) 0.350 - 4.500 uIU/mL    Comment: Performed by a 3rd Generation assay with a functional sensitivity of <=0.01 uIU/mL. Performed at [facility], [address] [place], [place] [address]   SARS Coronavirus 2 [facility] order, Performed in [facility] hospital lab)      Status: None   Collection Time: 2019-01-10  9:02 AM  Result Value Ref Range   SARS Coronavirus 2 NEGATIVE NEGATIVE    Comment: (NOTE) If result is NEGATIVE SARS-CoV-2 target nucleic acids are NOT DETECTED. The SARS-CoV-2 RNA is generally detectable in upper  and lower  respiratory specimens during the acute phase of infection. The lowest  concentration of SARS-CoV-2 viral copies this assay can detect is 250  copies / mL. A negative result does not preclude SARS-CoV-2 infection  and should not be used as the sole basis for treatment or other  patient management decisions.  A negative result may occur with  improper specimen collection / handling, submission of specimen other  than nasopharyngeal swab, presence of viral mutation(s) within the  areas targeted by this assay, and inadequate number of viral copies  (<250 copies / mL). A negative result must be combined with clinical  observations, patient history, and epidemiological information. If result is POSITIVE SARS-CoV-2 target nucleic acids are DETECTED. The SARS-CoV-2 RNA is generally detectable in upper and lower  respiratory specimens dur ing the acute phase of infection.  Positive  results are indicative of active infection with SARS-CoV-2.  Clinical  correlation with patient history and other diagnostic information is  necessary to determine patient infection status.  Positive results do  not rule out bacterial infection or co-infection with other viruses. If result is PRESUMPTIVE POSTIVE SARS-CoV-2 nucleic acids MAY BE PRESENT.   A presumptive positive result was obtained on the submitted specimen  and confirmed on repeat testing.  While 2019 novel coronavirus  (SARS-CoV-2) nucleic acids may be present in the submitted sample  additional confirmatory testing may be necessary for epidemiological  and / or clinical management purposes  to differentiate between  SARS-CoV-2 and other Sarbecovirus currently known to infect  humans.  If clinically indicated additional testing with an alternate test  methodology [phone]) is advised. The SARS-CoV-2 RNA is generally  detectable in upper and lower respiratory sp ecimens during the acute  phase of infection. The expected result is Negative. Fact Sheet for Patients:  BoilerBrush.com.cy Fact Sheet for Healthcare Providers: https://pope.com/ This test is not yet approved or cleared by the [place] FDA and has been authorized for detection and/or diagnosis of SARS-CoV-2 by FDA under an Emergency Use Authorization (EUA).  This EUA will remain in effect (meaning this test can be used) for the duration of the COVID-19 declaration under Section 564(b)(1) of the Act, 21 U.S.C. section 360bbb-3(b)(1), unless the authorization is terminated or revoked sooner. Performed at [facility], [address] [place], [place] [address]   Influenza panel by PCR (type A & B)     Status: None   Collection Time: Jan 24, 2019  9:02 AM  Result Value Ref Range   Influenza A By PCR NEGATIVE NEGATIVE   Influenza B By PCR NEGATIVE NEGATIVE    Comment: (NOTE) The Xpert Xpress Flu assay is intended as an aid in the diagnosis of  influenza and should not be used as a sole basis for treatment.  This  assay is FDA approved for nasopharyngeal swab specimens only. Nasal  washings and aspirates are unacceptable for Xpert Xpress Flu testing. Performed at [facility], [address] [place], [place] [address]   MRSA PCR Screening     Status: None   Collection Time: 01/24/2019  9:02 AM  Result Value Ref Range   MRSA by PCR NEGATIVE NEGATIVE    Comment:        The GeneXpert MRSA Assay (FDA approved for NASAL specimens only), is one component of a comprehensive MRSA colonization surveillance program. It is not intended to diagnose MRSA infection nor to guide or monitor treatment for MRSA infections. Performed at  [facility], [address] [place], [place] [address]   Glucose, capillary     Status: Abnormal   Collection Time: 01/21/2019  9:05 AM  Result Value Ref Range   Glucose-Capillary 155 (H) 70 - 99 mg/dL  Glucose, capillary     Status: Abnormal   Collection Time: 01/21/19 11:57 AM  Result Value Ref Range   Glucose-Capillary 144 (H) 70 - 99 mg/dL    Dg Chest Portable 1 View  Result Date: 01/21/19 CLINICAL DATA:  Chest pain and shortness of breath. Congestive heart failure. EXAM: PORTABLE CHEST 1 VIEW COMPARISON:  One-view chest x-ray 08/24/2018. FINDINGS: Heart is enlarged. Aortic atherosclerosis is present. There is mild diffuse interstitial prominence. No significant consolidated airspace disease is present. Minimal bibasilar atelectasis is evident. IMPRESSION: 1. Cardiomegaly with diffuse mild edema consistent with congestive heart failure. 2. Minimal bibasilar airspace disease likely reflects atelectasis. Electronically Signed   By: Marin Roberts M.D.   On: Jan 21, 2019 06:14    Review of Systems  Constitutional: Positive for diaphoresis and malaise/fatigue.  HENT: Positive for congestion.   Eyes: Negative.   Respiratory: Positive for cough and shortness of breath.   Cardiovascular: Positive for orthopnea, leg swelling and PND.  Gastrointestinal: Negative.   Genitourinary: Negative.   Musculoskeletal: Positive for myalgias.  Skin: Negative.   Neurological: Negative.   Endo/Heme/Allergies: Negative.   Psychiatric/Behavioral: Negative.    Blood pressure 104/66, pulse (!) 114, temperature (!) 96.4 F (35.8 C), temperature source Axillary, resp. rate 15, height 5' 11.5" (1.816 m), weight 71.2 kg, SpO2 93 %. Physical Exam  Nursing note and vitals reviewed. Constitutional: He is oriented to person, place, and time. He appears well-developed and well-nourished.  HENT:  Head: Atraumatic.  Eyes: Pupils are equal, round, and reactive to light. Conjunctivae and EOM are  normal.  Neck: Normal range of motion. Neck supple.  Cardiovascular: Normal rate, regular rhythm and normal heart sounds.  Respiratory: He has wheezes. He has rales.  GI: Soft. Bowel sounds are normal.  Musculoskeletal: Normal range of motion.  Neurological: He is alert and oriented to person, place, and time. He has normal reflexes.  Skin: Skin is warm.  Psychiatric: He has a normal mood and affect.    Assessment/Plan: Congestive heart failure Cardiomyopathy systolic dysfunction Shortness of breath Coronary disease Diabetes Hyperlipidemia Hypertension Peripheral vascular disease Coronary artery disease coronary bypass surgery CVA . Plan Agree with admission to ICU Supplemental oxygen and respiratory care Diuretic therapy for heart failure Agree with respiratory support with BiPAP as needed Continue anticoagulation with heparin Elevated troponins possible non-STEMI Acute respiratory failure improving Continue hypertension control Continue with diabetes management Recommend invasive strategy at this point  Haniyyah Sakuma D Delayni Streed 01/21/2019, 12:43 PM

## 2019-01-06 NOTE — Progress Notes (Signed)
Family At bedside, clinical status relayed to family  Updated and notified of patients medical condition-  Progressive multiorgan failure with very low chance of meaningful recovery.  Patient is in dying  Process.  Family understands the situation.  They have consented and agreed to DNR.  Will re-assess clinical status in next 24 hrs   Family are satisfied with Plan of action and management. All questions answered  Lucie Leather, M.D.  [facility]  Medical Director [facility] Medical Director [facility] Cardio-Pulmonary Department

## 2019-01-06 NOTE — ED Notes (Signed)
Pt denies chest pain at this time.

## 2019-01-06 NOTE — Progress Notes (Signed)
PHARMACIST - PHYSICIAN ORDER COMMUNICATION  CONCERNING: P&T Medication Policy on Herbal Medications  DESCRIPTION:  This patient's order for:  Lutein  has been noted.  This product(s) is classified as an "herbal" or natural product. Due to a lack of definitive safety studies or FDA approval, nonstandard manufacturing practices, plus the potential risk of unknown drug-drug interactions while on inpatient medications, the Pharmacy and Therapeutics Committee does not permit the use of "herbal" or natural products of this type within [place].   ACTION TAKEN: The pharmacy department is unable to verify this order at this time. Please reevaluate patient's clinical condition at discharge and address if the herbal or natural product(s) should be resumed at that time.  

## 2019-01-06 NOTE — ED Notes (Signed)
X-ray is in the room.  

## 2019-01-06 NOTE — Consult Note (Signed)
Name: Reginald Franklin MRN: [id number] DOB: 19-Nov-1940    ADMISSION DATE:  01/10/2019 CONSULTATION DATE: 01/16/2019  REFERRING MD : Dr. Sheryle Hailiamond   CHIEF COMPLAINT: Shortness of Breath   BRIEF PATIENT DESCRIPTION:  78 yo male admitted with acute on chronic hypoxic respiratory failure secondary to pulmonary edema requiring Bipap  SIGNIFICANT EVENTS/STUDIES:  04/16-Pt admitted to the stepdown unit   HISTORY OF PRESENT ILLNESS:   This is a 78 yo male with a PMH as listed below who [facility] via EMS on 04/16 with c/o shortness of breath and dry cough.  Per ER notes EMS reported pts O2 sats were in the low 80's on RA requiring 6L O2 via nasal canula with O2 sats increasing to the mid 90's.  He denied fever or contact with known positive COVID-19 pts.  In the ER CXR concerning for pulmonary edema.  He received 40 mg IV lasix, 1/2 inch of nitropaste, aspirin, and 1.25 iv enalapril.  EKG revealed atrial flutter with increasing ST segment depression in leads V5 and V6.  Due to worsening respiratory failure pt placed on Bipap. Lab results revealed BUN 32, creatinine 1.66, BNP 374, troponin 0.37, wbc 13.6, and hgb 12.5.  He is COVID-19 negative.  He was subsequently admitted to the stepdown unit by hospitalist team for additional workup and treatment.    PAST MEDICAL HISTORY :   has a past medical history of Anginal pain (HCC), BPH (benign prostatic hyperplasia), CHF (congestive heart failure) (HCC), Coronary artery disease, Diabetes mellitus without complication (HCC), Erosive gastropathy, Gastritis, Hemorrhoids, Hiatal hernia, Hyperlipidemia, Hypertension, Nasal polyp, Peripheral vascular disease (HCC), S/P CABG x 3, Seasonal allergies, and Stroke (HCC).  has a past surgical history that includes Coronary artery bypass graft; Carotid endarterectomy; Hernia repair; Tonsillectomy; Cardiac catheterization (N/A, 06/10/2016); Cardiac catheterization (N/A, 06/10/2016); LEFT HEART CATH AND CORS/GRAFTS  ANGIOGRAPHY (N/A, 08/24/2018); and CORONARY STENT INTERVENTION (N/A, 08/24/2018). Prior to Admission medications   Medication Sig Start Date End Date Taking? Authorizing Provider  amLODipine (NORVASC) 5 MG tablet Take 5 mg by mouth daily. 07/31/18  Yes [provider]  aspirin 81 MG chewable tablet Chew 1 tablet (81 mg total) by mouth daily. 08/27/18  Yes Enid BaasKalisetti, Radhika, MD  atorvastatin (LIPITOR) 20 MG tablet Take 20 mg by mouth daily at 6 PM.   Yes [provider]  Cholecalciferol 25 MCG (1000 UT) tablet Take 1,000 Units by mouth daily.    Yes [provider]  clopidogrel (PLAVIX) 75 MG tablet Take 1 tablet (75 mg total) by mouth daily with breakfast. 06/11/16  Yes Fath, Darlin PriestlyKenneth A, MD  ezetimibe (ZETIA) 10 MG tablet Take 10 mg by mouth at bedtime.   Yes [provider]  fexofenadine (ALLEGRA) 60 MG tablet Take 60 mg by mouth daily.   Yes [provider]  fluticasone (FLONASE) 50 MCG/ACT nasal spray Place 2 sprays into both nostrils daily as needed for rhinitis.    Yes [provider]  furosemide (LASIX) 20 MG tablet Take 1 tablet (20 mg total) by mouth daily. 08/27/18  Yes Enid BaasKalisetti, Radhika, MD  isosorbide mononitrate (IMDUR) 30 MG 24 hr tablet Take 30 mg by mouth daily. 08/10/18  Yes [provider]  losartan (COZAAR) 50 MG tablet Take 1 tablet (50 mg total) by mouth daily. 08/26/18 08/26/19 Yes Enid BaasKalisetti, Radhika, MD  Lutein 20 MG TABS Take 1 tablet by mouth daily.    Yes [provider]  metFORMIN (GLUCOPHAGE) 1000 MG tablet Take 1,000 mg  by mouth 2 (two) times daily. 08/16/18  Yes [provider]  Multiple Vitamin (MULTIVITAMIN) tablet Take 1 tablet by mouth daily.   Yes [provider]  Multiple Vitamins-Minerals (VISION FORMULA EYE HEALTH PO) Take 1 tablet by mouth daily.   Yes [provider]  pantoprazole (PROTONIX) 40 MG tablet Take 40 mg by mouth daily.   Yes [provider]   carvedilol (COREG) 6.25 MG tablet Take 6.25 mg by mouth 2 (two) times daily with a meal.    [provider]  cetirizine (ZYRTEC) 10 MG tablet Take 10 mg by mouth at bedtime.    [provider]   Allergies  Allergen Reactions  . Codeine Nausea And Vomiting    FAMILY HISTORY:  family history is not on file. SOCIAL HISTORY:  reports that he has never smoked. He has never used smokeless tobacco. He reports that he does not drink alcohol or use drugs.  REVIEW OF SYSTEMS: Positives in BOLD  Constitutional: Negative for fever, chills, weight loss, malaise/fatigue and diaphoresis.  HENT: Negative for hearing loss, ear pain, nosebleeds, congestion, sore throat, neck pain, tinnitus and ear discharge.   Eyes: Negative for blurred vision, double vision, photophobia, pain, discharge and redness.  Respiratory: cough, hemoptysis, sputum production, shortness of breath, wheezing and stridor.   Cardiovascular: Negative for chest pain, palpitations, orthopnea, claudication, leg swelling and PND.  Gastrointestinal: Negative for heartburn, nausea, vomiting, abdominal pain, diarrhea, constipation, blood in stool and melena.  Genitourinary: Negative for dysuria, urgency, frequency, hematuria and flank pain.  Musculoskeletal: Negative for myalgias, back pain, joint pain and falls.  Skin: Negative for itching and rash.  Neurological: Negative for dizziness, tingling, tremors, sensory change, speech change, focal weakness, seizures, loss of consciousness, weakness and headaches.  Endo/Heme/Allergies: Negative for environmental allergies and polydipsia. Does not bruise/bleed easily.  SUBJECTIVE:  Pt c/o mild shortness of breath   VITAL SIGNS: Temp:  [97.5 F (36.4 C)] 97.5 F (36.4 C) (04/16 0529) Pulse Rate:  [39-82] 66 (04/16 0800) Resp:  [20-38] 22 (04/16 0800) BP: (112-153)/(66-89) 127/71 (04/16 0800) SpO2:  [87 %-100 %] 100 % (04/16 0800) Weight:  [71.7 kg] 71.7 kg (04/16 0533)   PHYSICAL EXAMINATION: General: well developed, well nourished male, NAD  Neuro: alert and oriented, follows commands HEENT: supple, no JVD  Cardiovascular: nsr, rrr, no R/G Lungs: crackles throughout, even, non labored  Abdomen: +BS x4, soft, obese, non tender, non distended Musculoskeletal: normal bulk and tone, no edema Skin: intact no rashes or lesions   Recent Labs  Lab 01/05/2019 0527  NA 138  K 3.9  CL 101  CO2 22  BUN 32*  CREATININE 1.66*  GLUCOSE 160*   Recent Labs  Lab 01/13/2019 0527  HGB 12.5*  HCT 38.7*  WBC 13.6*  PLT 275   Dg Chest Portable 1 View  Result Date: 01/12/2019 CLINICAL DATA:  Chest pain and shortness of breath. Congestive heart failure. EXAM: PORTABLE CHEST 1 VIEW COMPARISON:  One-view chest x-ray 08/24/2018. FINDINGS: Heart is enlarged. Aortic atherosclerosis is present. There is mild diffuse interstitial prominence. No significant consolidated airspace disease is present. Minimal bibasilar atelectasis is evident. IMPRESSION: 1. Cardiomegaly with diffuse mild edema consistent with congestive heart failure. 2. Minimal bibasilar airspace disease likely reflects atelectasis. Electronically Signed   By: Marin Roberts M.D.   On: 01/10/2019 06:14    ASSESSMENT / PLAN:  Acute on chronic hypoxic respiratory failure secondary  Supplemental O2 for dyspnea and/or hypoxia Prn bronchodilator therapy  Acute CHF exacerbation  Elevated troponin's secondary to NSTEMI vs. demand ischemia  Hx: CABG x3, CAD, and Stroke  Continuous telemetry monitoring  Trend troponin's Continue outpatient cardiac medications Continue iv lasix  Cardiology consulted appreciate input  Continue heparin gtt  Acute renal failure  Trend BMP  Replace electrolytes as indicated  Monitor UOP Avoid nephrotoxic medications   Anemia without obvious acute blood loss Trend CBC  Monitor for s/sx of bleeding Transfuse for hgb <7 and/or active signs of bleeding   Type II  Diabetes Mellitus  SSI  Sonda Rumble, AGNP  Pulmonary/Critical Care Pager [phone] (please enter 7 digits) PCCM Consult Pager [phone] (please enter 7 digits)

## 2019-01-06 NOTE — ED Provider Notes (Addendum)
[facility] Emergency Department Provider Note   ____________________________________________   First MD Initiated Contact with Patient Jan 14, 2019 0533     (approximate)  I have reviewed the triage vital signs and the nursing notes.   HISTORY  Chief Complaint Chest Pain; Shortness of Breath; and Cough    HPI Reginald Franklin is a 78 y.o. male patient with a history of CHF and chest pain or tightness 2 or 3 out of 10.  He also has shortness of breath and a dry cough.  He has a history of CHF and had a stent placement about 3 months ago.  EMS reported O2 sats on room air were in the low 80s and improved to the mid 90s with 6 L nasal cannula.  Patient has not had a fever.  He has been out of his house lately but has not been around any known coronavirus patients.         Past Medical History:  Diagnosis Date  . Anginal pain (HCC)   . BPH (benign prostatic hyperplasia)   . CHF (congestive heart failure) (HCC)   . Coronary artery disease   . Diabetes mellitus without complication [facility])    Patient takes Metformin.  . Erosive gastropathy   . Gastritis   . Hemorrhoids   . Hiatal hernia   . Hyperlipidemia   . Hypertension   . Nasal polyp   . Peripheral vascular disease (HCC)   . S/P CABG x 3   . Seasonal allergies   . Stroke [facility])     Patient Active Problem List   Diagnosis Date Noted  . Acute on chronic systolic CHF (congestive heart failure) (HCC) 08/24/2018  . Non-ST elevation (NSTEMI) myocardial infarction (HCC)   . CKD (chronic kidney disease) stage 3, GFR 30-59 ml/min (HCC) 08/10/2018  . Congestive heart failure (CHF) (HCC) 06/09/2018  . Benign prostatic hyperplasia 07/03/2016  . Environmental allergies 07/03/2016  . Angina, class I (HCC) 07/03/2016  . History of nasal polyp 07/03/2016  . Hyperlipidemia, unspecified 07/03/2016  . Hypertension 07/03/2016  . PVD (peripheral vascular disease) (HCC) 07/03/2016  . Coronary artery disease  06/10/2016  . CAD (coronary artery disease) 06/10/2016  . Unstable angina (HCC) 06/10/2016  . 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 Anemia 04/25/2014  . Diabetes (HCC) 04/25/2014    Past Surgical History:  Procedure Laterality Date  . CARDIAC CATHETERIZATION N/A 06/10/2016   Procedure: Coronary/Graft Angiography;  Surgeon: Dalia Heading, MD;  Location: [facility] INVASIVE CV LAB;  Service: Cardiovascular;  Laterality: N/A;  . CARDIAC CATHETERIZATION N/A 06/10/2016   Procedure: Coronary Stent Intervention;  Surgeon: Marcina Millard, MD;  Location: [facility] INVASIVE CV LAB;  Service: Cardiovascular;  Laterality: N/A;  . CAROTID ENDARTERECTOMY    . CORONARY ARTERY BYPASS GRAFT    . CORONARY STENT INTERVENTION N/A 08/24/2018   Procedure: CORONARY STENT INTERVENTION;  Surgeon: Yvonne Kendall, MD;  Location: [facility] INVASIVE CV LAB;  Service: Cardiovascular;  Laterality: N/A;  . HERNIA REPAIR    . LEFT HEART CATH AND CORS/GRAFTS ANGIOGRAPHY N/A 08/24/2018   Procedure: LEFT HEART CATH AND CORS/GRAFTS ANGIOGRAPHY;  Surgeon: Lamar Blinks, MD;  Location: [facility] INVASIVE CV LAB;  Service: Cardiovascular;  Laterality: N/A;  . TONSILLECTOMY      Prior to Admission medications   Medication Sig Start Date End Date Taking? Authorizing Provider  amLODipine (NORVASC) 5 MG tablet Take 5 mg by mouth daily. 07/31/18   [provider]  aspirin 81 MG chewable  tablet Chew 1 tablet (81 mg total) by mouth daily. 08/27/18   Enid Baas, MD  atorvastatin (LIPITOR) 80 MG tablet Take 1 tablet (80 mg total) by mouth daily. Patient taking differently: Take 20 mg by mouth daily.  08/26/18   Enid Baas, MD  carvedilol (COREG) 6.25 MG tablet Take 6.25 mg by mouth 2 (two) times daily with a meal.    [provider]  cetirizine (ZYRTEC) 10 MG tablet Take 10 mg by mouth at bedtime.    [provider]  Cholecalciferol 25 MCG (1000 UT) tablet Take 1,000 Units by mouth daily.      [provider]  clopidogrel (PLAVIX) 75 MG tablet Take 1 tablet (75 mg total) by mouth daily with breakfast. 06/11/16   Dalia Heading, MD  ezetimibe (ZETIA) 10 MG tablet Take 10 mg by mouth at bedtime.    [provider]  fexofenadine (ALLEGRA) 60 MG tablet Take 60 mg by mouth daily.    [provider]  fluticasone (FLONASE) 50 MCG/ACT nasal spray Place 2 sprays into both nostrils daily as needed for rhinitis.     [provider]  furosemide (LASIX) 20 MG tablet Take 1 tablet (20 mg total) by mouth daily. 08/27/18   Enid Baas, MD  isosorbide mononitrate (IMDUR) 30 MG 24 hr tablet Take 30 mg by mouth daily. 08/10/18   [provider]  losartan (COZAAR) 50 MG tablet Take 1 tablet (50 mg total) by mouth daily. 08/26/18 08/26/19  Enid Baas, MD  Lutein 20 MG TABS Take 1 tablet by mouth daily.     [provider]  metFORMIN (GLUCOPHAGE) 1000 MG tablet Take 1,000 mg by mouth 2 (two) times daily. 08/16/18   [provider]  Multiple Vitamin (MULTIVITAMIN) tablet Take 1 tablet by mouth daily.    [provider]  Multiple Vitamins-Minerals (VISION FORMULA EYE HEALTH PO) Take 1 tablet by mouth daily.    [provider]    Allergies Codeine  History reviewed. No pertinent family history.  Social History Social History   Tobacco Use  . Smoking status: Never Smoker  . Smokeless tobacco: Never Used  Substance Use Topics  . Alcohol use: No  . Drug use: No    Review of Systems  Constitutional: No fever/chills Eyes: No visual changes. ENT: No sore throat. Cardiovascular: Mild chest pain. Respiratory:  shortness of breath. Gastrointestinal: No abdominal pain.  No nausea, no vomiting.  No diarrhea.  No constipation. Genitourinary: Negative for dysuria. Musculoskeletal: Negative for back pain. Skin: Negative for rash. Neurological: Negative for headaches, focal weakness    ____________________________________________   PHYSICAL EXAM:  VITAL SIGNS: ED Triage Vitals  Enc Vitals Group     BP 01/13/2019 0529 (!) 153/88     Pulse Rate 2019/01/13 0529 82     Resp Jan 13, 2019 0529 (!) 30     Temp 01-13-2019 0529 (!) 97.5 F (36.4 C)     Temp Source 01/13/2019 0529 Oral     SpO2 01-13-19 0529 96 %     Weight 01/13/19 0533 158 lb (71.7 kg)     Height 13-Jan-2019 0533  (1.803 m)     Head Circumference --      Peak Flow --      Pain Score 2019/01/13 0530 3     Pain Loc --      Pain Edu? --      Excl. in GC? --     Constitutional: Alert and oriented.  Short  of breath looking Eyes: Conjunctivae are normal.  Head: Atraumatic. Nose: No congestion/rhinnorhea. Mouth/Throat: Mucous membranes are moist.  Oropharynx non-erythematous. Neck: No stridor. Cardiovascular: Normal rate, regular rhythm. Grossly normal heart sounds.  Good peripheral circulation. Respiratory: Normal respiratory effort.  No retractions. Lungs diffuse crackles Gastrointestinal: Soft and nontender. No distention. No abdominal bruits.  Musculoskeletal: No lower extremity tenderness nor edema. Neurologic:  Normal speech and language. No gross focal neurologic deficits are appreciated.  Skin:  Skin is warm, dry and intact. No rash noted.   ____________________________________________   LABS (all labs ordered are listed, but only abnormal results are displayed)  Labs Reviewed  CBC WITH DIFFERENTIAL/PLATELET - Abnormal; Notable for the following components:      Result Value   WBC 13.6 (*)    RBC 4.13 (*)    Hemoglobin 12.5 (*)    HCT 38.7 (*)    Neutro Abs 10.8 (*)    All other components within normal limits  COMPREHENSIVE METABOLIC PANEL - Abnormal; Notable for the following components:   Glucose, Bld 160 (*)    BUN 32 (*)    Creatinine, Ser 1.66 (*)    Total Bilirubin 2.4 (*)    GFR calc non Af Amer 39 (*)    GFR calc Af Amer 45 (*)    All other components within normal limits   TROPONIN I - Abnormal; Notable for the following components:   Troponin I 0.37 (*)    All other components within normal limits  BRAIN NATRIURETIC PEPTIDE  APTT  PROTIME-INR  HEPARIN LEVEL (UNFRACTIONATED)   ____________________________________________  EKG  EKG read interpreted by me shows normal sinus rhythm rate of 82 normal axis there are flipped leads and 1 and aVL and upright inferiorly into 3 and F this is the reverse of what was present earlier.  He also has ST segment depression in V5 and V6 which is slightly more than previously. __EKG #2 shows normal sinus rhythm rate of 82 left axis similar to #1  EKG #3 shows what appears to be a flutter at a rate of 80 worsening ST segment depression in V5 and V6 should added immediately previous to this the monitor was showing a flutter at a rate of 113.   EKG #4 shows what looks like a flutter with block at a rate of 78 left axis still with increased ST segment depression V5 and V6 compared to the initial EKG __________________________________________  RADIOLOGY  ED MD interpretation: Chest x-ray read by me is CHF  Official radiology report(s): No results found.  ____________________________________________   PROCEDURES  Procedure(s) performed (including Critical Care): Critical care time 45 minutes.  This is spent either at the bedside or at the computer putting in orders were talking to the nurse.  I also then discussed the patient with the hospitalist.  Procedures bedside ultrasound revealed lots of comet tails consistent with congestive heart failure.   ____________________________________________   INITIAL IMPRESSION / ASSESSMENT AND PLAN / ED COURSE  Patient coming on bedside ultrasound looks like congestive failure.  Patient given 40 Lasix IV and half inch of Nitropaste.  Patient's blood pressure actually goes up.  Chest x-ray was done then continues to look like CHF.  We will give him some enalapril and possibly some  more Nitropaste.  We will call respiratory and have BiPAP started.  Patient is not at risk for coronavirus. Patient given 40 of Lasix IV and 1/2 inch of Nitropaste and enalapril 1.25 IV patient's pressure comes down  slightly the patient is getting increasingly short of breath and now sweaty has to sit up on the side of the bed.  Aspirin is given repeat EKG shows what appears to be a flutter with increasing ST segment depression in V5 and 6.  BiPAP comes and is placed on the patient and patient's heart rate goes down blood pressure comes up sweatiness goes away he feels better although not completely well by any means troponin has returned at 0.3 we will start a heparin bolus and drip get him in the hospital.              ____________________________________________   FINAL CLINICAL IMPRESSION(S) / ED DIAGNOSES  Final diagnoses:  Acute congestive heart failure, unspecified heart failure type (HCC)  Chest pain, unspecified type  Abnormal EKG  Elevated troponin     ED Discharge Orders    None       Note:  This document was prepared using Dragon voice recognition software and may include unintentional dictation errors.    Arnaldo NatalMalinda, Artin Mceuen F, MD 01/14/2019 [id number]    Arnaldo NatalMalinda, Penelopi Mikrut F, MD 01/02/2019 [id number]

## 2019-01-06 NOTE — Progress Notes (Signed)
Pastoral Care Visit   01/05/2019 1330  Clinical Encounter Type  Visited With Patient;Family;Health care provider  Visit Type Initial;Psychological support;Code;Patient actively dying  Referral From Physician;Nurse  Consult/Referral To Chaplain  Spiritual Encounters  Spiritual Needs Grief support  Stress Factors  Patient Stress Factors Health changes  Family Stress Factors Health changes   Chap responded to Code Blue.  Melven Sartorius present while med team worked on pt administering CPR and then intubating. Pt was with MD when pt spouse called.  Met spouse and stayed with her and addtl fam members.  Assisted with fam meeting w med team regarding plan of care of pt.  Pt spouse does not want to keep pt alive artificially, doesn't want him to suffer, willing to let him go.  Darcey Nora, Chaplain

## 2019-01-06 NOTE — ED Notes (Signed)
ED TO INPATIENT HANDOFF REPORT  ED Nurse Name and Phone #: Aicha Clingenpeel [phone]  S Name/Age/Gender Reginald Franklin 78 y.o. male Room/Bed: ED03A/ED03A  Code Status   Code Status: Prior  Home/SNF/Other Home A&Ox4   Triage Complete: Triage complete  Chief Complaint [facility] EMS Diff Breathing  Triage Note Pt arrived to the ED via EMS from home for complaints of chest pain, shortness of breath and dry cough. Pt reports that he has CHF with stent placement 3 months ago. Pt's O2 sats on room air are in the low 80's resolving with [place] at 6lpm. Pt is AOx4 in no apparent distress.    Allergies Allergies  Allergen Reactions  . Codeine Nausea And Vomiting    Level of Care/Admitting Diagnosis ED Disposition    ED Disposition Condition Comment   Admit  [facility] [[id number]]  Level of Care: Stepdown [14]  Diagnosis: NSTEMI (non-ST elevated myocardial infarction) [facility](HCC) [[id number]]) [[id number]]  Admitting Physician: Arnaldo NatalIAMOND, MICHAEL S [[id number]]  Attending Physician: Arnaldo NatalDIAMOND, MICHAEL S [[id number]]  Estimated length of stay: past midnight tomorrow  Certification:: I certify this patient will need inpatient services for at least 2 midnights  Possible Covid Disease Patient Isolation: High Risk (Aerosolizing procedure, nebulizer, intubated/ventilation, CPAP/BiPAP)  PT Class (Do Not Modify): Inpatient [101]  PT Acc Code (Do Not Modify): Private [1]       B Medical/Surgery History Past Medical History:  Diagnosis Date  . Anginal pain (HCC)   . BPH (benign prostatic hyperplasia)   . CHF (congestive heart failure) (HCC)   . Coronary artery disease   . Diabetes mellitus without complication [facility](HCC)    Patient takes Metformin.  . Erosive gastropathy   . Gastritis   . Hemorrhoids   . Hiatal hernia   . Hyperlipidemia   . Hypertension   . Nasal polyp   . Peripheral vascular disease (HCC)   . S/P CABG x 3   . Seasonal allergies   . Stroke [facility](HCC)    Past Surgical History:   Procedure Laterality Date  . CARDIAC CATHETERIZATION N/A 06/10/2016   Procedure: Coronary/Graft Angiography;  Surgeon: Dalia HeadingKenneth A Fath, MD;  Location: [facility] INVASIVE CV LAB;  Service: Cardiovascular;  Laterality: N/A;  . CARDIAC CATHETERIZATION N/A 06/10/2016   Procedure: Coronary Stent Intervention;  Surgeon: Marcina MillardAlexander Paraschos, MD;  Location: [facility] INVASIVE CV LAB;  Service: Cardiovascular;  Laterality: N/A;  . CAROTID ENDARTERECTOMY    . CORONARY ARTERY BYPASS GRAFT    . CORONARY STENT INTERVENTION N/A 08/24/2018   Procedure: CORONARY STENT INTERVENTION;  Surgeon: Yvonne KendallEnd, Christopher, MD;  Location: [facility] INVASIVE CV LAB;  Service: Cardiovascular;  Laterality: N/A;  . HERNIA REPAIR    . LEFT HEART CATH AND CORS/GRAFTS ANGIOGRAPHY N/A 08/24/2018   Procedure: LEFT HEART CATH AND CORS/GRAFTS ANGIOGRAPHY;  Surgeon: Lamar BlinksKowalski, Bruce J, MD;  Location: [facility] INVASIVE CV LAB;  Service: Cardiovascular;  Laterality: N/A;  . TONSILLECTOMY       A IV Location/Drains/Wounds Patient Lines/Drains/Airways Status   Active Line/Drains/Airways    Name:   Placement date:   Placement time:   Site:   Days:   Peripheral IV Jan 25, 2019 Left Antecubital   Jan 25, 2019    0518    Antecubital   less than 1          Intake/Output Last 24 hours No intake or output data in the 24 hours ending Jan 25, 2019 [id number]  Labs/Imaging Results for orders placed or performed during the hospital encounter of Jan 25, 2019 (from the past 48 hour(s))  CBC with Differential     Status: Abnormal   Collection Time: 01/12/19  5:27 AM  Result Value Ref Range   WBC 13.6 (H) 4.0 - 10.5 K/uL   RBC 4.13 (L) 4.22 - 5.81 MIL/uL   Hemoglobin 12.5 (L) 13.0 - 17.0 g/dL   HCT 35.3 (L) 29.9 - 24.2 %   MCV 93.7 80.0 - 100.0 fL   MCH 30.3 26.0 - 34.0 pg   MCHC 32.3 30.0 - 36.0 g/dL   RDW 68.3 41.9 - 62.2 %   Platelets 275 150 - 400 K/uL   nRBC 0.0 0.0 - 0.2 %   Neutrophils Relative % 80 %   Neutro Abs 10.8 (H) 1.7 - 7.7 K/uL   Lymphocytes Relative 11 %    Lymphs Abs 1.5 0.7 - 4.0 K/uL   Monocytes Relative 7 %   Monocytes Absolute 1.0 0.1 - 1.0 K/uL   Eosinophils Relative 1 %   Eosinophils Absolute 0.2 0.0 - 0.5 K/uL   Basophils Relative 1 %   Basophils Absolute 0.1 0.0 - 0.1 K/uL   Immature Granulocytes 0 %   Abs Immature Granulocytes 0.05 0.00 - 0.07 K/uL    Comment: Performed at [facility], [address] [place], [place] [address]  Comprehensive metabolic panel     Status: Abnormal   Collection Time: 01/12/19  5:27 AM  Result Value Ref Range   Sodium 138 135 - 145 mmol/L   Potassium 3.9 3.5 - 5.1 mmol/L   Chloride 101 98 - 111 mmol/L   CO2 22 22 - 32 mmol/L   Glucose, Bld 160 (H) 70 - 99 mg/dL   BUN 32 (H) 8 - 23 mg/dL   Creatinine, Ser 9.21 (H) 0.61 - 1.24 mg/dL   Calcium 9.5 8.9 - 19.4 mg/dL   Total Protein 8.1 6.5 - 8.1 g/dL   Albumin 4.9 3.5 - 5.0 g/dL   AST 25 15 - 41 U/L   ALT 20 0 - 44 U/L   Alkaline Phosphatase 39 38 - 126 U/L   Total Bilirubin 2.4 (H) 0.3 - 1.2 mg/dL   GFR calc non Af Amer 39 (L) >60 mL/min   GFR calc Af Amer 45 (L) >60 mL/min   Anion gap 15 5 - 15    Comment: Performed at [facility], [address] [place], [place] [address]  Troponin I - ONCE - STAT     Status: Abnormal   Collection Time: Jan 12, 2019  5:27 AM  Result Value Ref Range   Troponin I 0.37 (HH) <0.03 ng/mL    Comment: CRITICAL RESULT CALLED TO, READ BACK BY AND VERIFIED WITH [place] RIVERIA AT [phone] 2019-01-12 SMA Performed at [facility], [address] [place], [place] [address]   Brain natriuretic peptide     Status: Abnormal   Collection Time: Jan 12, 2019  5:27 AM  Result Value Ref Range   B Natriuretic Peptide 374.0 (H) 0.0 - 100.0 pg/mL    Comment: Performed at [facility], [address] [place], [place] [address]  APTT     Status: None   Collection Time: 12-Jan-2019  5:27 AM  Result Value Ref Range   aPTT 36 24 - 36 seconds    Comment: Performed at [facility], [address] [place], [place] [address]  Protime-INR     Status: Abnormal   Collection Time: 01-12-19  5:27 AM  Result Value Ref Range   Prothrombin Time 16.0 (H) 11.4 - 15.2 seconds  INR 1.3 (H) 0.8 - 1.2    Comment: (NOTE) INR goal varies based on device and disease states. Performed at [facility], [address], [place], [place] [address]    Dg Chest Portable 1 View  Result Date: 01/21/2019 CLINICAL DATA:  Chest pain and shortness of breath. Congestive heart failure. EXAM: PORTABLE CHEST 1 VIEW COMPARISON:  One-view chest x-ray 08/24/2018. FINDINGS: Heart is enlarged. Aortic atherosclerosis is present. There is mild diffuse interstitial prominence. No significant consolidated airspace disease is present. Minimal bibasilar atelectasis is evident. IMPRESSION: 1. Cardiomegaly with diffuse mild edema consistent with congestive heart failure. 2. Minimal bibasilar airspace disease likely reflects atelectasis. Electronically Signed   By: Marin Roberts M.D.   On: 01-21-19 06:14    Pending Labs Unresulted Labs (From admission, onward)    Start     Ordered   01/21/19 1430  Heparin level (unfractionated)  Once-Timed,   STAT     2019/01/21 0609   01-21-19 0650  Influenza panel by PCR (type A & B)  Add-on,   AD     01/21/19 0651   01-21-19 0649  SARS Coronavirus 2 [facility] order, Performed in [facility] hospital lab)  (Novel Coronavirus, [facility] Order))  Once,   STAT     Jan 21, 2019 0651   01/21/19 0649  ABO/Rh  Once,   STAT     2019-01-21 0651   Signed and Held  TSH  Add-on,   R     Signed and Held          Vitals/Pain Today's Vitals   01/21/19 0600 2019/01/21 0615 2019-01-21 0630 01/21/19 0700  BP: 112/89  135/66 139/68  Pulse: (!) 39  68 64  Resp: (!) 38  (!) 25 20  Temp:      TempSrc:      SpO2: (!) 87% 100% 100% 100%  Weight:      Height:      PainSc:        Isolation Precautions Airborne and Contact precautions  Medications Medications  heparin ADULT  infusion 100 units/mL (25000 units/268mL sodium chloride 0.45%) (850 Units/hr Intravenous New Bag/Given Jan 21, 2019 0621)  furosemide (LASIX) injection 40 mg (40 mg Intravenous Given Jan 21, 2019 0538)  nitroGLYCERIN (NITROGLYN) 2 % ointment 0.5 inch (0.5 inches Topical Given 01/21/19 0539)  0.9 %  sodium chloride infusion (10 mL/hr Intravenous New Bag/Given 21-Jan-2019 0623)  enalaprilat (VASOTEC) injection 1.25 mg (1.25 mg Intravenous Given 01-21-2019 0551)  aspirin chewable tablet 324 mg (324 mg Oral Given 01-21-2019 0557)    Mobility walks with device     R Recommendations: See Admitting Provider Note  Report given to:   Additional Notes:

## 2019-01-06 NOTE — Procedures (Signed)
Endotracheal Intubation: Patient required placement of an artificial airway secondary to Respiratory Failure  Consent: Emergent.   Hand washing performed prior to starting the procedure.   Medications administered for sedation prior to procedure:  Midazolam 4 mg IV,  Vecuronium 10 mg IV, Fentanyl 100 mcg IV.    A time out procedure was called and correct patient, name, & ID confirmed. Needed supplies and equipment were assembled and checked to include ETT, 10 ml syringe, Glidescope, Mac and Miller blades, suction, oxygen and bag mask valve, end tidal CO2 monitor.   Patient was positioned to align the mouth and pharynx to facilitate visualization of the glottis.   Heart rate, SpO2 and blood pressure was continuously monitored during the procedure. Pre-oxygenation was conducted prior to intubation and endotracheal tube was placed through the vocal cords into the trachea.     The artificial airway was placed under direct visualization via glidescope route using a 8.0 ETT on the first attempt.  ETT was secured at 23 cm mark.  Placement was confirmed by auscuitation of lungs with good breath sounds bilaterally and no stomach sounds.  Condensation was noted on endotracheal tube.   Pulse ox 98%.  CO2 detector in place with appropriate color change.   Complications: None .   Operator: Anakin Varkey.   Chest radiograph ordered and pending.   Comments: OGT placed via glidescope.  Ottis Sarnowski David Derhonda Eastlick, M.D.  [facility]  Medical Director ICU-[facility] [place] Medical Director [facility] Cardio-Pulmonary Department       

## 2019-01-06 NOTE — ED Triage Notes (Signed)
Pt arrived to the ED via EMS from home for complaints of chest pain, shortness of breath and dry cough. Pt reports that he has CHF with stent placement 3 months ago. Pt's O2 sats on room air are in the low 80's resolving with [place] at 6lpm. Pt is AOx4 in no apparent distress.

## 2019-01-07 ENCOUNTER — Inpatient Hospital Stay: Payer: Medicare Other

## 2019-01-07 DIAGNOSIS — Z66 Do not resuscitate: Secondary | ICD-10-CM

## 2019-01-07 DIAGNOSIS — Z7189 Other specified counseling: Secondary | ICD-10-CM

## 2019-01-07 DIAGNOSIS — Z515 Encounter for palliative care: Secondary | ICD-10-CM

## 2019-01-07 DIAGNOSIS — I509 Heart failure, unspecified: Secondary | ICD-10-CM

## 2019-01-07 DIAGNOSIS — I214 Non-ST elevation (NSTEMI) myocardial infarction: Principal | ICD-10-CM

## 2019-01-07 LAB — CBC WITH DIFFERENTIAL/PLATELET
Abs Immature Granulocytes: 0.19 10*3/uL — ABNORMAL HIGH (ref 0.00–0.07)
Basophils Absolute: 0 10*3/uL (ref 0.0–0.1)
Basophils Relative: 0 %
Eosinophils Absolute: 0 10*3/uL (ref 0.0–0.5)
Eosinophils Relative: 0 %
HCT: 37.7 % — ABNORMAL LOW (ref 39.0–52.0)
Hemoglobin: 12 g/dL — ABNORMAL LOW (ref 13.0–17.0)
Immature Granulocytes: 1 %
Lymphocytes Relative: 3 %
Lymphs Abs: 0.7 10*3/uL (ref 0.7–4.0)
MCH: 30.1 pg (ref 26.0–34.0)
MCHC: 31.8 g/dL (ref 30.0–36.0)
MCV: 94.5 fL (ref 80.0–100.0)
Monocytes Absolute: 1.2 10*3/uL — ABNORMAL HIGH (ref 0.1–1.0)
Monocytes Relative: 6 %
Neutro Abs: 17.2 10*3/uL — ABNORMAL HIGH (ref 1.7–7.7)
Neutrophils Relative %: 90 %
Platelets: 264 10*3/uL (ref 150–400)
RBC: 3.99 MIL/uL — ABNORMAL LOW (ref 4.22–5.81)
RDW: 14.2 % (ref 11.5–15.5)
WBC: 19.3 10*3/uL — ABNORMAL HIGH (ref 4.0–10.5)
nRBC: 0 % (ref 0.0–0.2)

## 2019-01-07 LAB — GLUCOSE, CAPILLARY
Glucose-Capillary: 134 mg/dL — ABNORMAL HIGH (ref 70–99)
Glucose-Capillary: 146 mg/dL — ABNORMAL HIGH (ref 70–99)
Glucose-Capillary: 144 mg/dL — ABNORMAL HIGH (ref 70–99)
Glucose-Capillary: 121 mg/dL — ABNORMAL HIGH (ref 70–99)
Glucose-Capillary: 112 mg/dL — ABNORMAL HIGH (ref 70–99)

## 2019-01-07 LAB — BASIC METABOLIC PANEL
Anion gap: 14 (ref 5–15)
BUN: 43 mg/dL — ABNORMAL HIGH (ref 8–23)
CO2: 25 mmol/L (ref 22–32)
Calcium: 8.7 mg/dL — ABNORMAL LOW (ref 8.9–10.3)
Chloride: 103 mmol/L (ref 98–111)
Creatinine, Ser: 2.83 mg/dL — ABNORMAL HIGH (ref 0.61–1.24)
GFR calc Af Amer: 24 mL/min — ABNORMAL LOW (ref >60)
GFR calc non Af Amer: 21 mL/min — ABNORMAL LOW (ref >60)
Glucose, Bld: 185 mg/dL — ABNORMAL HIGH (ref 70–99)
Potassium: 5 mmol/L (ref 3.5–5.1)
Sodium: 142 mmol/L (ref 135–145)

## 2019-01-07 LAB — HEPARIN LEVEL (UNFRACTIONATED)
Heparin Unfractionated: 0.3 IU/mL (ref 0.30–0.70)
Heparin Unfractionated: 0.53 IU/mL (ref 0.30–0.70)
Heparin Unfractionated: 0.42 IU/mL (ref 0.30–0.70)

## 2019-01-07 LAB — BLOOD GAS, ARTERIAL
Acid-Base Excess: 1.2 mmol/L (ref 0.0–2.0)
Bicarbonate: 26 mmol/L (ref 20.0–28.0)
FIO2: 0.5
MECHVT: 450 mL
O2 Saturation: 98.3 %
PEEP: 10 cmH2O
Patient temperature: 37
RATE: 22 resp/min
pCO2 arterial: 41 mmHg (ref 32.0–48.0)
pH, Arterial: 7.41 (ref 7.350–7.450)
pO2, Arterial: 109 mmHg — ABNORMAL HIGH (ref 83.0–108.0)

## 2019-01-07 MED ORDER — PANTOPRAZOLE SODIUM 40 MG IV SOLR
40.0000 mg | Freq: Every day | INTRAVENOUS | Status: DC
  Administered 2019-01-07: 15:00:00 40 mg via INTRAVENOUS

## 2019-01-07 MED ORDER — ATORVASTATIN CALCIUM 20 MG PO TABS: 20.0000 mg | ORAL_TABLET | Freq: Every day | ORAL | Status: DC

## 2019-01-08 NOTE — Discharge Summary (Signed)
DEATH SUMMARY   Patient Details  Name: Reginald Franklin MRN: [id number] DOB: 1941-06-13  Admission/Discharge Information   Admit Date:  01/22/2019  Date of Death: Date of Death: 01/23/19  Time of Death: Time of Death: Feb 04, 2229  Length of Stay: 2  Referring Physician: Marguarite Arbour, MD   Reason(s) for Hospitalization  NSTEMI and CHF exacerbation with subsequent cardiac arrest  Diagnoses  Preliminary cause of death: Cardiac arrest Secondary Diagnoses (including complications and co-morbidities):  Active Problems:   NSTEMI (non-ST elevated myocardial infarction) [facility])   Acute respiratory failure [facility])   Brief Hospital Course (including significant findings, care, treatment, and services provided and events leading to death)  Reginald Franklin is a 78 y.o. year old male who was admitted withNSTEMI and CHF exacerbation.  On 01-22-2019, he suffered a cardiac arrest secondary to acute MI, was intubated and a central venous catheter was placed.  He was aggressively resuscitated however his overall health status remained tenuous.  After consultation with the family given his extensive cardiac history patient was made a DNR.  At about 2300, patient had a rhythm change on the monitor and later bradycardia down into asystole.  He was pronounced at 02/04/29.  Family notified.  Pertinent Labs and Studies  Significant Diagnostic Studies Dg Abd 1 View  Result Date: Jan 22, 2019 CLINICAL DATA:  NG tube placement. EXAM: ABDOMEN - 1 VIEW COMPARISON:  None FINDINGS: An enteric tube terminates in the proximal stomach with side hole also below the level of the GE junction. Gas and stool are partially visualized and colon. No dilated loops of bowel are evident in the included upper portion of the abdomen. Bilateral lung opacities were more fully evaluated on today's earlier chest radiograph. IMPRESSION: Enteric tube in the proximal stomach. Electronically Signed   By: Sebastian Ache M.D.   On: Jan 22, 2019 15:06   Dg  Chest Port 1 View  Result Date: 01/23/2019 CLINICAL DATA:  Acute respiratory failure EXAM: PORTABLE CHEST 1 VIEW COMPARISON:  01/22/2019 FINDINGS: Cardiac shadow is stable. Postsurgical changes are again seen. Endotracheal tube and gastric catheter are noted. Aortic calcifications are again seen. The lungs are well aerated bilaterally. Diffuse central vascular congestion with pulmonary edema is noted stable from the prior exam. IMPRESSION: Stable changes of pulmonary edema. Electronically Signed   By: Alcide Clever M.D.   On: 01-23-2019 03:07   Dg Chest Port 1 View  Result Date: 01/22/19 CLINICAL DATA:  Acute respiratory failure. Endotracheal tube placement. EXAM: PORTABLE CHEST 1 VIEW COMPARISON:  Earlier same day FINDINGS: Endotracheal tube tip is 6 cm above the carina. Nasogastric or orogastric tube enters the abdomen. Previous median sternotomy and CABG. Cardiomegaly. Aortic atherosclerosis. Diffuse edema pattern is slightly improved. IMPRESSION: Lines and tubes well positioned. Slight improvement of diffuse edema pattern. Electronically Signed   By: Paulina Fusi M.D.   On: 2019/01/22 15:59   Dg Chest Portable 1 View  Result Date: Jan 22, 2019 CLINICAL DATA:  Chest pain and shortness of breath. Congestive heart failure. EXAM: PORTABLE CHEST 1 VIEW COMPARISON:  One-view chest x-ray 08/24/2018. FINDINGS: Heart is enlarged. Aortic atherosclerosis is present. There is mild diffuse interstitial prominence. No significant consolidated airspace disease is present. Minimal bibasilar atelectasis is evident. IMPRESSION: 1. Cardiomegaly with diffuse mild edema consistent with congestive heart failure. 2. Minimal bibasilar airspace disease likely reflects atelectasis. Electronically Signed   By: Marin Roberts M.D.   On: Jan 22, 2019 06:14    Microbiology Recent Results (from the past 240 hour(s))  SARS Coronavirus  2 [facility](Hospital order, Performed in [facility] lab)     Status: None   Collection  Time: Sep 10, 2019  9:02 AM  Result Value Ref Range Status   SARS Coronavirus 2 NEGATIVE NEGATIVE Final    Comment: (NOTE) If result is NEGATIVE SARS-CoV-2 target nucleic acids are NOT DETECTED. The SARS-CoV-2 RNA is generally detectable in upper and lower  respiratory specimens during the acute phase of infection. The lowest  concentration of SARS-CoV-2 viral copies this assay can detect is 250  copies / mL. A negative result does not preclude SARS-CoV-2 infection  and should not be used as the sole basis for treatment or other  patient management decisions.  A negative result may occur with  improper specimen collection / handling, submission of specimen other  than nasopharyngeal swab, presence of viral mutation(s) within the  areas targeted by this assay, and inadequate number of viral copies  (<250 copies / mL). A negative result must be combined with clinical  observations, patient history, and epidemiological information. If result is POSITIVE SARS-CoV-2 target nucleic acids are DETECTED. The SARS-CoV-2 RNA is generally detectable in upper and lower  respiratory specimens dur ing the acute phase of infection.  Positive  results are indicative of active infection with SARS-CoV-2.  Clinical  correlation with patient history and other diagnostic information is  necessary to determine patient infection status.  Positive results do  not rule out bacterial infection or co-infection with other viruses. If result is PRESUMPTIVE POSTIVE SARS-CoV-2 nucleic acids MAY BE PRESENT.   A presumptive positive result was obtained on the submitted specimen  and confirmed on repeat testing.  While 2019 novel coronavirus  (SARS-CoV-2) nucleic acids may be present in the submitted sample  additional confirmatory testing may be necessary for epidemiological  and / or clinical management purposes  to differentiate between  SARS-CoV-2 and other Sarbecovirus currently known to infect humans.  If  clinically indicated additional testing with an alternate test  methodology [phone]([id number]) is advised. The SARS-CoV-2 RNA is generally  detectable in upper and lower respiratory sp ecimens during the acute  phase of infection. The expected result is Negative. Fact Sheet for Patients:  BoilerBrush.com.cyhttps://www.fda.gov/media/136312/download Fact Sheet for Healthcare Providers: https://pope.com/https://www.fda.gov/media/136313/download This test is not yet approved or cleared by the [place] FDA and has been authorized for detection and/or diagnosis of SARS-CoV-2 by FDA under an Emergency Use Authorization (EUA).  This EUA will remain in effect (meaning this test can be used) for the duration of the COVID-19 declaration under Section 564(b)(1) of the Act, 21 U.S.C. section 360bbb-3(b)(1), unless the authorization is terminated or revoked sooner. Performed at [facility], [address] [place], [place] [address]   MRSA PCR Screening     Status: None   Collection Time: Sep 10, 2019  9:02 AM  Result Value Ref Range Status   MRSA by PCR NEGATIVE NEGATIVE Final    Comment:        The GeneXpert MRSA Assay (FDA approved for NASAL specimens only), is one component of a comprehensive MRSA colonization surveillance program. It is not intended to diagnose MRSA infection nor to guide or monitor treatment for MRSA infections. Performed at [facility], [address] [place], [place] [address]     Lab Basic Metabolic Panel: Recent Labs  Lab Sep 10, 2019 0527 Sep 10, 2019 1356 01/11/2019 0034  NA 138 140 142  K 3.9 2.9* 5.0  CL 101 95* 103  CO2 22 23 25   GLUCOSE 160* 178* 185*  BUN 32*  32* 43*  CREATININE 1.66* 1.56* 2.83*  CALCIUM 9.5 8.3* 8.7*  MG  --  1.9  --   PHOS  --  5.1*  --    Liver Function Tests: Recent Labs  Lab 01/23/19 0527 01/23/19 1356  AST 25 55*  ALT 20 22  ALKPHOS 39 38  BILITOT 2.4* 2.1*  PROT 8.1 6.4*  ALBUMIN 4.9 3.8   No results for input(s): LIPASE, AMYLASE in  the last 168 hours. No results for input(s): AMMONIA in the last 168 hours. CBC: Recent Labs  Lab 2019/01/23 0527 01-23-19 1356 12/26/2018 0034  WBC 13.6* 18.0* 19.3*  NEUTROABS 10.8* 12.7* 17.2*  HGB 12.5* 11.7* 12.0*  HCT 38.7* 37.3* 37.7*  MCV 93.7 95.6 94.5  PLT 275 233 264   Cardiac Enzymes: Recent Labs  Lab 01-23-2019 0527 2019-01-23 1356  TROPONINI 0.37* 6.58*   Sepsis Labs: Recent Labs  Lab 01/23/2019 0527 01/23/2019 1356 12/27/2018 0034  WBC 13.6* 18.0* 19.3*    Procedures/Operations  Endotracheal intubation Central venous catheter placement   Magddalene S Tukov-Yual 01/08/2019, 5:00 AM

## 2019-01-08 NOTE — Progress Notes (Signed)
Body left floor to morgue with orderly and NT.

## 2019-01-21 ENCOUNTER — Telehealth: Payer: Self-pay

## 2019-01-21 NOTE — Progress Notes (Signed)
Wife left taking personal belongings. Will give funeral information to [facility] funeral home tomorrow.

## 2019-01-21 NOTE — Consult Note (Signed)
ANTICOAGULATION CONSULT NOTE  Pharmacy Consult for Heparin Infusion  Indication: chest pain/ACS  Patient Measurements: Height: 5' 11.5" (181.6 cm) Weight: 167 lb 12.3 oz (76.1 kg) IBW/kg (Calculated) : 76.45 Heparin DW 71.2 kg  Vital Signs: Temp: 98.8 F (37.1 C) (04/17 1200) Temp Source: Axillary (04/17 1200) BP: 93/63 (04/17 1200) Pulse Rate: 84 (04/17 1200)  Labs: Recent Labs    18-Jan-2019 0527 01/18/2019 1356 12/23/2018 0034 01/17/2019 0956  HGB 12.5* 11.7* 12.0*  --   HCT 38.7* 37.3* 37.7*  --   PLT 275 233 264  --   APTT 36  --   --   --   LABPROT 16.0*  --   --   --   INR 1.3*  --   --   --   HEPARINUNFRC  --  0.12* 0.30 0.53  CREATININE 1.66* 1.56* 2.83*  --   TROPONINI 0.37* 6.58*  --   --     Estimated Creatinine Clearance: 23.5 mL/min (A) (by C-G formula based on SCr of 2.83 mg/dL (H)).  Assessment: Pharmacy consulted for heparin dosing and monitoring for 77yo male admitted with ACS/STEMI. No reported anticoagulants PTA.  01/18/23 Patient coded this afternoon - delay in heparin therapy adjustment. Patient is now intubated. H/H, PLT stable  Goal of Therapy:  Heparin level 0.3-0.7 units/ml Monitor platelets by anticoagulation protocol: Yes   Heparin Course: 01-18-2023 AM initiation: 4000 unit bolus then 850 units/hr 2023/01/18 1356 HL 0.12: rate increased to 1150 units/hr 4/17 0034 HL 0.30: rate increased to 1200 units/hr 4/17 0956 HL 0.53 4/17 1757 HL 0.42  Plan:  --Level is therapeutic x 2: continue infusion of 1200 unit/hr and follow-up next level with tomorrow AM labs.   --Will continue to check daily heparin level and CBCs while on heparin per protocol.  Continue to monitor H&H and platelets.   Lowella Bandy, PharmD Clinical Pharmacist 01/06/2019 2:38 PM

## 2019-01-21 NOTE — Progress Notes (Signed)
[facility] [place] [facility]   PATIENT NAME: Reginald Franklin    MR#:  [id number]  DATE OF BIRTH:  1940-10-24  SUBJECTIVE:  CHIEF COMPLAINT:   Chief Complaint  Patient presents with  . Chest Pain  . Shortness of Breath  . Cough   Patient came with shortness of breath and chest pain noted to have elevated troponin started on BiPAP on admission and heparin drip. He had cardiac arrest and was intubated 2019-06-18-remains on vent. REVIEW OF SYSTEMS:  Patient on ventilatory support and cannot give review of system. ROS  DRUG ALLERGIES:   Allergies  Allergen Reactions  . Codeine Nausea And Vomiting    VITALS:  Blood pressure 90/60, pulse 84, temperature 98 F (36.7 C), temperature source Axillary, resp. rate 13, height 5' 11.5" (1.816 m), weight 76.1 kg, SpO2 98 %.  PHYSICAL EXAMINATION:  GENERAL:  78 y.o.-year-old patient lying in the bed, critical appearance.  EYES: Pupils equal, round, reactive to light . No scleral icterus. Extraocular muscles intact.  HEENT: Head atraumatic, normocephalic. Oropharynx and nasopharynx clear.  NECK:  Supple, no jugular venous distention. No thyroid enlargement, no tenderness.  LUNGS: Normal breath sounds bilaterally, no wheezing, rales,rhonchi or crepitation. No use of accessory muscles of respiration.  ETT in place with ventilatory support. CARDIOVASCULAR: S1, S2 normal. No murmurs, rubs, or gallops.  ABDOMEN: Soft, nontender, nondistended. Bowel sounds present. No organomegaly or mass.  EXTREMITIES: No pedal edema, cyanosis, or clubbing.  NEUROLOGIC: Patient is sedated on ventilatory support. PSYCHIATRIC: The patient is sedated on ventilator.  SKIN: No obvious rash, lesion, or ulcer.   Physical Exam LABORATORY PANEL:   CBC Recent Labs  Lab 12/22/2018 0034  WBC 19.3*  HGB 12.0*  HCT 37.7*  PLT 264    ------------------------------------------------------------------------------------------------------------------  Chemistries  Recent Labs  Lab 02020-09-26 1356 01/10/2019 0034  NA 140 142  K 2.9* 5.0  CL 95* 103  CO2 23 25  GLUCOSE 178* 185*  BUN 32* 43*  CREATININE 1.56* 2.83*  CALCIUM 8.3* 8.7*  MG 1.9  --   AST 55*  --   ALT 22  --   ALKPHOS 38  --   BILITOT 2.1*  --    ------------------------------------------------------------------------------------------------------------------  Cardiac Enzymes Recent Labs  Lab 02020-09-26 0527 02020-09-26 1356  TROPONINI 0.37* 6.58*   ------------------------------------------------------------------------------------------------------------------  RADIOLOGY:  Dg Abd 1 View  Result Date: 2020-05-2619 CLINICAL DATA:  NG tube placement. EXAM: ABDOMEN - 1 VIEW COMPARISON:  None FINDINGS: An enteric tube terminates in the proximal stomach with side hole also below the level of the GE junction. Gas and stool are partially visualized and colon. No dilated loops of bowel are evident in the included upper portion of the abdomen. Bilateral lung opacities were more fully evaluated on today's earlier chest radiograph. IMPRESSION: Enteric tube in the proximal stomach. Electronically Signed   By: Sebastian AcheAllen  Grady M.D.   On: 02020-05-2619 15:06   Dg Chest Port 1 View  Result Date: 01/19/2019 CLINICAL DATA:  Acute respiratory failure EXAM: PORTABLE CHEST 1 VIEW COMPARISON:  02020-05-2619 FINDINGS: Cardiac shadow is stable. Postsurgical changes are again seen. Endotracheal tube and gastric catheter are noted. Aortic calcifications are again seen. The lungs are well aerated bilaterally. Diffuse central vascular congestion with pulmonary edema is noted stable from the prior exam. IMPRESSION: Stable changes of pulmonary edema. Electronically Signed   By: Alcide CleverMark  Lukens M.D.   On: 01/01/2019 03:07   Dg Chest Port 1 View  Result Date: 2020-05-2619  CLINICAL DATA:  Acute  respiratory failure. Endotracheal tube placement. EXAM: PORTABLE CHEST 1 VIEW COMPARISON:  Earlier same day FINDINGS: Endotracheal tube tip is 6 cm above the carina. Nasogastric or orogastric tube enters the abdomen. Previous median sternotomy and CABG. Cardiomegaly. Aortic atherosclerosis. Diffuse edema pattern is slightly improved. IMPRESSION: Lines and tubes well positioned. Slight improvement of diffuse edema pattern. Electronically Signed   By: Paulina Fusi M.D.   On: 01/12/19 15:59   Dg Chest Portable 1 View  Result Date: 01/12/2019 CLINICAL DATA:  Chest pain and shortness of breath. Congestive heart failure. EXAM: PORTABLE CHEST 1 VIEW COMPARISON:  One-view chest x-ray 08/24/2018. FINDINGS: Heart is enlarged. Aortic atherosclerosis is present. There is mild diffuse interstitial prominence. No significant consolidated airspace disease is present. Minimal bibasilar atelectasis is evident. IMPRESSION: 1. Cardiomegaly with diffuse mild edema consistent with congestive heart failure. 2. Minimal bibasilar airspace disease likely reflects atelectasis. Electronically Signed   By: Marin Roberts M.D.   On: Jan 12, 2019 06:14    ASSESSMENT AND PLAN:   Active Problems:   NSTEMI (non-ST elevated myocardial infarction) (HCC)   Acute respiratory failure (HCC)  1.  NSTEMI: Elevated troponin,  Cardiology saw the patient and agreed on heparin drip and treatment of CHF. Status post cardiac arrest now.  Palliative care under consideration. 2.  Sepsis: Suspected on admission but ruled out and no need for antibiotics. 3.  CAD: Unstable; continue aspirin and Plavix-Heparin drip. 4.  Hypertension: Uncontrolled; continue enalapril 5.  Diabetes mellitus type 2:  Sliding scale insulin while hospitalized 6.  DVT prophylaxis: Therapeutic anticoagulation as above 7.  GI prophylaxis: None 8.  Acute respiratory failure with cardiac arrest-as mentioned above palliative care consideration underway.    Wife want  to consider complete comfort care in a day or 2 if there is no improvement.  If there is any worsening then she would not like to go aggressive measures like CPR.   All the records are reviewed and case discussed with Care Management/Social Workerr. Management plans discussed with the patient, family and they are in agreement.  CODE STATUS: DNR  TOTAL TIME TAKING CARE OF THIS PATIENT: 35 minutes.   POSSIBLE D/C IN 1-2 DAYS, DEPENDING ON CLINICAL CONDITION.   Altamese Dilling M.D on 01/09/2019   Between 7am to 6pm - Pager - [phone]  After 6pm go to [email or web address] - password [email or web address]  [facility]  Office  [phone]  CC: Primary care physician; Marguarite Arbour, MD  Note: This dictation was prepared with Dragon dictation along with smaller phrase technology. Any transcriptional errors that result from this process are unintentional.

## 2019-01-21 NOTE — Telephone Encounter (Signed)
Death certificate received from [place] and [place]. Placed in MD box for signing.

## 2019-01-21 NOTE — Consult Note (Addendum)
ANTICOAGULATION CONSULT NOTE - Follow Up Consult  Pharmacy Consult for Heparin Infusion  Indication: chest pain/ACS  Allergies  Allergen Reactions  . Codeine Nausea And Vomiting   Patient Measurements: Height: 5' 11.5" (181.6 cm) Weight: 167 lb 12.3 oz (76.1 kg) IBW/kg (Calculated) : 76.45 Heparin DW 71.2 kg  Vital Signs: Temp: 98.8 F (37.1 C) (04/17 0800) Temp Source: Oral (04/17 0800) BP: 80/59 (04/17 0900) Pulse Rate: 82 (04/17 0900)  Labs: Recent Labs    2019/01/11 0527 Jan 11, 2019 1356 01/05/2019 0034 01/02/2019 0956  HGB 12.5* 11.7* 12.0*  --   HCT 38.7* 37.3* 37.7*  --   PLT 275 233 264  --   APTT 36  --   --   --   LABPROT 16.0*  --   --   --   INR 1.3*  --   --   --   HEPARINUNFRC  --  0.12* 0.30 0.53  CREATININE 1.66* 1.56* 2.83*  --   TROPONINI 0.37* 6.58*  --   --     Estimated Creatinine Clearance: 23.5 mL/min (A) (by C-G formula based on SCr of 2.83 mg/dL (H)).  Assessment: Pharmacy consulted for heparin dosing and monitoring for 77yo male admitted with ACS/STEMI. No reported anticoagulants PTA.  January 11, 2023 Patient coded this afternoon - delay in heparin therapy adjustment. Patient is now intubated.  Goal of Therapy:  Heparin level 0.3-0.7 units/ml Monitor platelets by anticoagulation protocol: Yes   Plan:  01/11/2023 HL @ 1356 0.12 (prior to code) - no bolus per CCM (increased rate from 850 units/hr  to 1150 units/hr) 4/17 HL @ 0034 0.30 increased rate to 1200 units/hr 4/17 HL @ 0956 0.53  4/17 @ 0956 HL 0.53. Level is therapeutic x 1. Will continue infusion of 1200 unit/hr and recheck confirmatory level in 8 hours.   Will continue to check daily heparin level and CBCs while on heparin per protocol.  Continue to monitor H&H and platelets.    Mauri Reading, PharmD Pharmacy Resident  12/30/2018 10:40 AM

## 2019-01-21 NOTE — Consult Note (Signed)
ANTICOAGULATION CONSULT NOTE - Follow Up Consult  Pharmacy Consult for Heparin Infusion  Indication: chest pain/ACS  Allergies  Allergen Reactions  . Codeine Nausea And Vomiting   Patient Measurements: Height: 5' 11.5" (181.6 cm) Weight: 156 lb 15.5 oz (71.2 kg) IBW/kg (Calculated) : 76.45 Heparin DW 71.2 kg  Vital Signs: Temp: 98 F (36.7 C) (04/17 0000) Temp Source: Axillary (04/17 0000) BP: 98/65 (04/17 0115) Pulse Rate: 80 (04/17 0115)  Labs: Recent Labs    2019/01/12 0527 12-Jan-2019 1356 Jan 13, 2019 0034  HGB 12.5* 11.7* 12.0*  HCT 38.7* 37.3* 37.7*  PLT 275 233 264  APTT 36  --   --   LABPROT 16.0*  --   --   INR 1.3*  --   --   HEPARINUNFRC  --  0.12* 0.30  CREATININE 1.66* 1.56* 2.83*  TROPONINI 0.37* 6.58*  --     Estimated Creatinine Clearance: 22 mL/min (A) (by C-G formula based on SCr of 2.83 mg/dL (H)).  Assessment: Pharmacy consulted for heparin dosing and monitoring for 77yo male admitted with ACS/STEMI. No reported anticoagulants PTA.  January 12, 2023 Patient coded this afternoon - delay in heparin therapy adjustment. Patient is now intubated.  Goal of Therapy:  Heparin level 0.3-0.7 units/ml Monitor platelets by anticoagulation protocol: Yes   Plan:  4/17 @ 0034 HL 0.30. Level is just therapeutic. Will increase infusion to 1200 unit/hr and recheck confirmatory level in 8 hours.   Will continue to check daily heparin level and CBCs while on heparin per protocol.  Continue to monitor H&H and platelets.   Gardner Candle, PharmD, BCPS Clinical Pharmacist 01/13/19 1:33 AM

## 2019-01-21 NOTE — Consult Note (Signed)
Consultation Note Date: 01-20-19   Patient Name: Reginald Franklin  DOB: 16-Jul-1941  MRN: [id number]  Age / Sex: 78 y.o., male   PCP: Idelle Crouch, MD Referring Physician: Vaughan Basta, *   REASON FOR CONSULTATION:Establishing goals of care  Palliative Care consult requested for this 78 y.o. male with multiple medical problems including congestive heart failure, CAD s/p CABG, type 2 diabetes, hyperlipidemia, BPH, and CVA. He was admitted after complaints of chest pain. Chest x-ray showed pulmonary edema, elevated troponin, and hypoxia (80s). He required BiPAP. Patient suffered a cardiac arrest on 4/16 with acute MI. He was intubated for airway protection. Palliative Medicine team consulted for goals of care.   Clinical Assessment and Goals of Care: I have reviewed medical records including lab results, imaging, Epic notes, and MAR, received report from the bedside RN, and assessed the patient. I met at the bedside with patient's wife, Reginald Franklin and nephew to discuss diagnosis prognosis, [place], EOL wishes, disposition and options. Patient remains intubated.   I introduced Palliative Medicine as specialized medical care for people living with serious illness. It focuses on providing relief from the symptoms and stress of a serious illness. The goal is to improve quality of life for both the patient and the family.  We discussed a brief life review of the patient, along with his overall functional and nutritional status. 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  He also served 8 years and the [employer].  He and his wife have been married for over 67 years and does not have any children.  Prior to his battle with his illness he enjoyed riding his motorcycle and working on cars.   Prior to admission family states patient was independent of ADLs.  He was ambulatory without assistive devices and was still driving.  Wife does report significant cardiac history with most  recent decline over the past month.  She states he has been unable to walk long distances without becoming extremely fatigued and short of breath.  She reports he has a good appetite.  We discussed His current illness and what it means in the larger context of His on-going co-morbidities. With specific discussions regarding his significant cardiac history, worsening renal function, and overall critical state. Natural disease trajectory and expectations at EOL were discussed.  He was tearful during conversation as well as other family members.  She states she is hopeful that he will pull-through however she is not nave and knowing he had a significant cardiac history prior to this heart attack. Family expressed they were prepared for patient to pass away during the night, but thankful that he did not.   I attempted to elicit values and goals of care important to the patient.    Wife shares, [name] would not want to remain in his current state or be on prolonged mechanical ventilation. She is remains hopeful but is also prepared for the worst. She is tearful in stating "I think this one will take him out and he will not recover. He wouldn't want to live if his quality of life is not at minimum to were he could be outside or do some things for himself!" Support given.   We discussed in details limitations knowing she felt he would not wish to remain intubated. She expressed if patient is not showing signs of improvement or is unable to wean from vent by Saturday or Sunday (with physician guidance) she and her family are prepared  to extubate and transition care to a more comfort and end-of-life approach. Support given. Other family verbalizing their agreement and support.   The difference between aggressive medical intervention and comfort care was considered in light of the patient's goals of care. I educated family on what comfort care measures would look like. I discussed with her by choosing to  transition care to a more comfort approach, patient would no longer receive aggressive medical interventions such as continuous vital signs, lab work, radiology testing, or medications not focused on comfort. All care would focus on how the patient is looking and feeling. This would include management of any symptoms that may cause discomfort, pain, shortness of breath, cough, nausea, agitation, anxiety, and/or secretions etc. Symptoms would be managed with medications and other non-pharmacological interventions such as spiritual support if requested, repositioning, music therapy, or therapeutic listening. Family verbalized understanding and appreciation.   Wife confirms DNR/DNI status. She again expresses in the event patient continues to decline while intubated she would not want further aggressive measures such as trach or peg. If patient is not showing signs of improvement or unable to wean she would like to proceed either Saturday or Sunday with one-way extubation and transition to comfort. She verbalized she would not want him to lay there on support for more than 2-3 days. If patient shows signs of improvement, she would be appreciative, but knows his prognosis regardless will not be positive. Family does not want any forms of artificial feedings or dialysis.  Also confirms if patient is able to successfully extubate and show signs of some improvement but later declines she would not want patient to be reintubated and would continue with transitioning to comfort.  Questions and concerns were addressed. The family was encouraged to call with questions or concerns.  PMT will continue to support holistically.   SOCIAL HISTORY:     reports that he has never smoked. He has never used smokeless tobacco. He reports that he does not drink alcohol or use drugs.  CODE STATUS: DNR/DNI   ADVANCE DIRECTIVES: Primary Decision Maker: Wife: Reginald Franklin     SYMPTOM MANAGEMENT: Per attending  Palliative  Prophylaxis:   Aspiration, Eye Care, Frequent Pain Assessment and Oral Care  PSYCHO-SOCIAL/SPIRITUAL:  Support System: Wife   Desire for further Chaplaincy support:NO   Additional Recommendations (Limitations, Scope, Preferences):  No Artificial Feeding and No Tracheostomy, continue to treat, with no escalation of care   PAST MEDICAL HISTORY: Past Medical History:  Diagnosis Date  . Anginal pain ([place])   . BPH (benign prostatic hyperplasia)   . CHF (congestive heart failure) ([place])   . Coronary artery disease   . Diabetes mellitus without complication [facility])    Patient takes Metformin.  . Erosive gastropathy   . Gastritis   . Hemorrhoids   . Hiatal hernia   . Hyperlipidemia   . Hypertension   . Nasal polyp   . Peripheral vascular disease ([place])   . S/P CABG x 3   . Seasonal allergies   . Stroke [facility])     PAST SURGICAL HISTORY:  Past Surgical History:  Procedure Laterality Date  . CARDIAC CATHETERIZATION N/A 06/10/2016   Procedure: Coronary/Graft Angiography;  Surgeon: Teodoro Spray, MD;  Location: [place] [facility];  Service: Cardiovascular;  Laterality: N/A;  . CARDIAC CATHETERIZATION N/A 06/10/2016   Procedure: Coronary Stent Intervention;  Surgeon: Isaias Cowman, MD;  Location: [place] [facility];  Service: Cardiovascular;  Laterality: N/A;  . CAROTID ENDARTERECTOMY    .  CORONARY ARTERY BYPASS GRAFT    . CORONARY STENT INTERVENTION N/A 08/24/2018   Procedure: CORONARY STENT INTERVENTION;  Surgeon: Nelva Bush, MD;  Location: [place] [facility];  Service: Cardiovascular;  Laterality: N/A;  . HERNIA REPAIR    . LEFT HEART CATH AND CORS/GRAFTS ANGIOGRAPHY N/A 08/24/2018   Procedure: LEFT HEART CATH AND CORS/GRAFTS ANGIOGRAPHY;  Surgeon: Corey Skains, MD;  Location: [place] [facility];  Service: Cardiovascular;  Laterality: N/A;  . TONSILLECTOMY      ALLERGIES:  is allergic to codeine.   MEDICATIONS:  Current Facility-Administered Medications   Medication Dose Route Frequency Provider Last Rate Last Dose  . acetaminophen (TYLENOL) tablet 650 mg  650 mg Per Tube Q6H PRN Awilda Bill, NP       Or  . acetaminophen (TYLENOL) suppository 650 mg  650 mg Rectal Q6H PRN Awilda Bill, NP      . aspirin chewable tablet 81 mg  81 mg Per Tube Daily Awilda Bill, NP   81 mg at 01-26-2019 0741  . atorvastatin (LIPITOR) tablet 20 mg  20 mg Per Tube q1800 Awilda Bill, NP   20 mg at 12/25/2018 1606  . chlorhexidine gluconate (MEDLINE KIT) (PERIDEX) 0.12 % solution 15 mL  15 mL Mouth Rinse BID Flora Lipps, MD   15 mL at 2019/01/26 0740  . clopidogrel (PLAVIX) tablet 75 mg  75 mg Per Tube Q breakfast Awilda Bill, NP   75 mg at Jan 26, 2019 0741  . docusate (COLACE) 50 MG/5ML liquid 100 mg  100 mg Oral BID Awilda Bill, NP   100 mg at 2019-01-26 0740  . ezetimibe (ZETIA) tablet 10 mg  10 mg Per Tube QHS Awilda Bill, NP   10 mg at 01/04/2019 2148  . fentaNYL (SUBLIMAZE) bolus via infusion 25 mcg  25 mcg Intravenous Q15 min PRN Awilda Bill, NP   25 mcg at Jan 26, 2019 6759  . fentaNYL (SUBLIMAZE) injection 25 mcg  25 mcg Intravenous Once Awilda Bill, NP      . fentaNYL 2561mg in NS 2587m(1048mml) infusion-PREMIX  25-200 mcg/hr Intravenous Continuous BlaAwilda BillP 20 mL/hr at 04/May 06, 202000 200 mcg/hr at 04/May 06, 202000  . heparin ADULT infusion 100 units/mL (25000 units/250m69mdium chloride 0.45%)  1,200 Units/hr Intravenous Continuous Hallaji, Sheema M, RPH 12 mL/hr at 04/1May 06, 20200 1,200 Units/hr at 04/105/06/20200  . insulin aspart (novoLOG) injection 0-9 Units  0-9 Units Subcutaneous Q4H BlakAwilda Bill   1 Units at 04/1May 06, 20201  . MEDLINE mouth rinse  15 mL Mouth Rinse 10 times per day KasaFlora Lipps   15 mL at 04/106-May-20203  . midazolam (VERSED) injection 1 mg  1 mg Intravenous Q15 min PRN BlakAwilda Bill   1 mg at 12/22/2018 2203  . midazolam (VERSED) injection 1 mg  1 mg Intravenous Q2H PRN BlakAwilda Bill    1 mg at 04/12020/05/063  . norepinephrine (LEVOPHED) 16 mg in 250mL10mmix infusion  0-40 mcg/min Intravenous Titrated BlakeAwilda Bill7.5 mL/hr at 01/19/2019 2119 8 mcg/min at 12/26/2018 2119  . ondansetron (ZOFRAN) tablet 4 mg  4 mg Oral Q6H PRN DiamoHarrie Foreman      Or  . ondansetron (ZOFRBon Secours Memorial Regional Medical Centerection 4 mg  4 mg Intravenous Q6H PRN DiamoHarrie Foreman     . pantoprazole (PROTONIX) injection 40 mg  40 mg Intravenous Q24H BlakeAwilda Bill  NP   40 mg at 12/22/2018 1606    VITAL SIGNS: BP (!) 120/57   Pulse 84   Temp 98.8 F (37.1 C) (Oral)   Resp (!) 22   Ht 5' 11.5" (1.816 m)   Wt 76.1 kg   SpO2 95%   BMI 23.07 kg/m  Filed Weights   01/01/2019 0533 01/20/2019 0900 01/28/2019 0426  Weight: 71.7 kg 71.2 kg 76.1 kg    Estimated body mass index is 23.07 kg/m as calculated from the following:   Height as of this encounter: 5' 11.5" (1.816 m).   Weight as of this encounter: 76.1 kg.  LABS: CBC:    Component Value Date/Time   WBC 19.3 (H) Jan 28, 2019 0034   HGB 12.0 (L) Jan 28, 2019 0034   HCT 37.7 (L) 01-28-19 0034   PLT 264 01/28/2019 0034   Comprehensive Metabolic Panel:    Component Value Date/Time   NA 142 Jan 28, 2019 0034   K 5.0 01-28-19 0034   CO2 25 01/28/19 0034   BUN 43 (H) Jan 28, 2019 0034   CREATININE 2.83 (H) Jan 28, 2019 0034   ALBUMIN 3.8 01/01/2019 1356     Review of Systems  Unable to perform ROS: Intubated    Physical Exam Vitals signs and nursing note reviewed.  Constitutional:      Appearance: He is ill-appearing.     Interventions: He is intubated.  Cardiovascular:     Rate and Rhythm: Normal rate and regular rhythm.     Pulses: Normal pulses.     Heart sounds: Normal heart sounds.  Pulmonary:     Effort: He is intubated.     Breath sounds: Decreased breath sounds and rhonchi present.  Abdominal:     General: Bowel sounds are normal.     Palpations: Abdomen is soft.  Skin:    General: Skin is warm and dry.     Findings:  Bruising present.  Neurological:     Mental Status: He is unresponsive.  Psychiatric:        Cognition and Memory: Cognition is impaired. Memory is impaired.        Judgment: Judgment is inappropriate.   Prognosis: Guarded to Poor s/p cardiac arrest, intubated, NSTEMI, CAD s/p CABG, CVA, neurogenic shock, severe hypoxia respiratory failure, CHF, hypertension, and diabetes.  Discharge Planning:  Anticipated Hospital Death  Recommendations:  DNR/no reintubation once extubated  Continue to treat with watchful waiting.  Wife prepared to transition to comfort with one-way extubation if no signs of improvement or ability to wean over the next 24 hours.  No aggressive medical interventions, no artificial feedings, no hemodialysis   Palliative medicine team will continue to support and follow.  No weekend coverage   Palliative Performance Scale: INTUBATED               Wife expressed understanding and was in agreement with this plan.   Thank you for allowing the Palliative Medicine Team to assist in the care of this patient.  Time In: 1330 Time Out: 1445 Time Total: 75 min   Visit consisted of counseling and education dealing with the complex and emotionally intense issues of symptom management and palliative care in the setting of serious and potentially life-threatening illness.Greater than 50%  of this time was spent counseling and coordinating care related to the above assessment and plan.  Signed by:  Alda Lea, AGPCNP-BC Palliative Medicine Team  Phone: [phone] Fax: [phone] Pager: [phone] Amion: Bjorn Pippin

## 2019-01-21 NOTE — Progress Notes (Addendum)
CRITICAL CARE NOTE  CC  Acute cardiac arrest  SUBJECTIVE Critically ill on the ventilator.  Poorly responsive.   SIGNIFICANT EVENTS 4/15 admitted for NSTEMI and CHF exacerbation 4/16 cardiac arrest acute MI,CPR for 12 minutes 4/16 intubated, CVL placed  REVIEW OF SYSTEMS:  PATIENT IS UNABLE TO PROVIDE COMPLETE REVIEW OF SYSTEMS DUE TO SEVERE CRITICAL ILLNESS   PHYSICAL EXAMINATION: Vitals reviewed.   GENERAL:critically ill appearing, intubated, mechanically ventilated, synchronous with the ventilator.  Poorly responsive. HEAD: Normocephalic, atraumatic.  EYES: Pupils equal, round, reactive to light.  No scleral icterus.  MOUTH: Moist mucosal membranes.  Orotracheally intubated NECK: Supple.  Trachea midline. No JVD.  PULMONARY: Coarse breath sounds throughout, no wheezes.  Occasional crackles at the bases.   CARDIOVASCULAR: S1 and S2. Regular rate and rhythm. No murmurs, rubs, or gallops.  GASTROINTESTINAL: Soft, non--distended. No masses. Positive bowel sounds. No hepatosplenomegaly.  MUSCULOSKELETAL: No swelling, clubbing, or edema.  NEUROLOGIC: Sedated on the ventilator, when sedation lightened does attempt to track.  Not following commands. SKIN:intact,warm,dry    CULTURE RESULTS   Recent Results (from the past 240 hour(s))  SARS Coronavirus 2 [facility](Hospital order, Performed [facility])     Status: None   Collection Time: 06-25-2019  9:02 AM  Result Value Ref Range Status   SARS Coronavirus 2 NEGATIVE NEGATIVE Final    Comment: (NOTE) If result is NEGATIVE SARS-CoV-2 target nucleic acids are NOT DETECTED. The SARS-CoV-2 RNA is generally detectable in upper and lower  respiratory specimens during the acute phase of infection. The lowest  concentration of SARS-CoV-2 viral copies this assay can detect is 250  copies / mL. A negative result does not preclude SARS-CoV-2 infection  and should not be used as the sole basis for treatment or other  patient  management decisions.  A negative result may occur with  improper specimen collection / handling, submission of specimen other  than nasopharyngeal swab, presence of viral mutation(s) within the  areas targeted by this assay, and inadequate number of viral copies  (<250 copies / mL). A negative result must be combined with clinical  observations, patient history, and epidemiological information. If result is POSITIVE SARS-CoV-2 target nucleic acids are DETECTED. The SARS-CoV-2 RNA is generally detectable in upper and lower  respiratory specimens dur ing the acute phase of infection.  Positive  results are indicative of active infection with SARS-CoV-2.  Clinical  correlation with patient history and other diagnostic information is  necessary to determine patient infection status.  Positive results do  not rule out bacterial infection or co-infection with other viruses. If result is PRESUMPTIVE POSTIVE SARS-CoV-2 nucleic acids MAY BE PRESENT.   A presumptive positive result was obtained on the submitted specimen  and confirmed on repeat testing.  While 2019 novel coronavirus  (SARS-CoV-2) nucleic acids may be present in the submitted sample  additional confirmatory testing may be necessary for epidemiological  and / or clinical management purposes  to differentiate between  SARS-CoV-2 and other Sarbecovirus currently known to infect humans.  If clinically indicated additional testing with an alternate test  methodology [phone] is advised. The SARS-CoV-2 RNA is generally  detectable in upper and lower respiratory sp ecimens during the acute  phase of infection. The expected result is Negative. Fact Sheet for Patients:  BoilerBrush.com.cyhttps://www.fda.gov/media/136312/download Fact Sheet for Healthcare Providers: https://pope.com/https://www.fda.gov/media/136313/download This test is not yet approved or cleared by the [place] FDA and has been authorized for detection and/or diagnosis of SARS-CoV-2 by FDA under  an Emergency Use Authorization (EUA).  This EUA will remain in effect (meaning this test can be used) for the duration of the COVID-19 declaration under Section 564(b)(1) of the Act, 21 U.S.C. section 360bbb-3(b)(1), unless the authorization is terminated or revoked sooner. Performed at [facility], [address] [place], [place] [address]   MRSA PCR Screening     Status: None   Collection Time: 12/31/2018  9:02 AM  Result Value Ref Range Status   MRSA by PCR NEGATIVE NEGATIVE Final    Comment:        The GeneXpert MRSA Assay (FDA approved for NASAL specimens only), is one component of a comprehensive MRSA colonization surveillance program. It is not intended to diagnose MRSA infection nor to guide or monitor treatment for MRSA infections. Performed at [facility], [address] [place], [place] [address]           IMAGING    Dg Chest Port 1 View  Result Date: Jan 19, 2019 CLINICAL DATA:  Acute respiratory failure EXAM: PORTABLE CHEST 1 VIEW COMPARISON:  01/16/2019 FINDINGS: Cardiac shadow is stable. Postsurgical changes are again seen. Endotracheal tube and gastric catheter are noted. Aortic calcifications are again seen. The lungs are well aerated bilaterally. Diffuse central vascular congestion with pulmonary edema is noted stable from the prior exam. IMPRESSION: Stable changes of pulmonary edema. Electronically Signed   By: Alcide Clever M.D.   On: 01-19-2019 03:07      Indwelling Urinary Catheter continued, requirement due to   Reason to continue Indwelling Urinary Catheter for strict Intake/Output monitoring for hemodynamic instability         Ventilator continued, requirement due to, resp failure    Ventilator Sedation RASS 0 to -2     ASSESSMENT AND PLAN SYNOPSIS   Severe ACUTE Hypoxic and Hypercapnic Respiratory Failure from acute MI and cardiogenic shock -continue Full MV support -Reassess for potential weaning in the  morning.   CARDIAC FAILURE-acute MI Cardiac arrest -follow up cardiology recs    NEUROLOGY - intubated and sedated - minimal sedation to achieve a RASS goal: -1 -Appears to have some encephalopathy post arrest reassess daily.   Cardiogenic shock -use vasopressors to keep MAP>65  CARDIAC ICU monitoring   GI/Nutrition GI PROPHYLAXIS as indicated DIET-->TF's on hold Constipation protocol as indicated  ENDO - ICU hypoglycemic\Hyperglycemia protocol -check FSBS per protocol   ELECTROLYTES -follow labs as needed -replace as needed -pharmacy consultation and following   DVT/GI PRX ordered TRANSFUSIONS AS NEEDED MONITOR FSBS ASSESS the need for LABS as needed   Critical Care Time devoted to patient care services described in this note is .   Overall, prognosis is guarded.  Patient's wife has been  updated at the bedside and understands that prognosis is very poor.  Disciplinary rounds were performed with ICU team.      C. Danice Goltz, M.D.  [facility] PCCM

## 2019-01-21 NOTE — Progress Notes (Signed)
Patient heart rate slowed and irregular then asystole. All vitals ceased. Nurse Practitioner Maude Leriche pronounced at Jan 22, 2229. Wife was called to come to the hospital.

## 2019-01-21 NOTE — Progress Notes (Signed)
Wife at bedside.

## 2019-01-21 NOTE — Progress Notes (Signed)
Chaplin in room.

## 2019-01-21 DEATH — deceased

## 2019-01-24 NOTE — Telephone Encounter (Signed)
Death Certificate complete, Katrinka Blazing and [facility] aware ready for pickup.

## 2020-02-16 IMAGING — DX DG CHEST 1V PORT
1 series · 1 of 1 positions shown · non-contrast
Comparison: Chest CT dated 05/19/2016

CLINICAL DATA: 77-year-old male with dyspnea.

EXAM:
PORTABLE CHEST 1 VIEW

[chest ap]
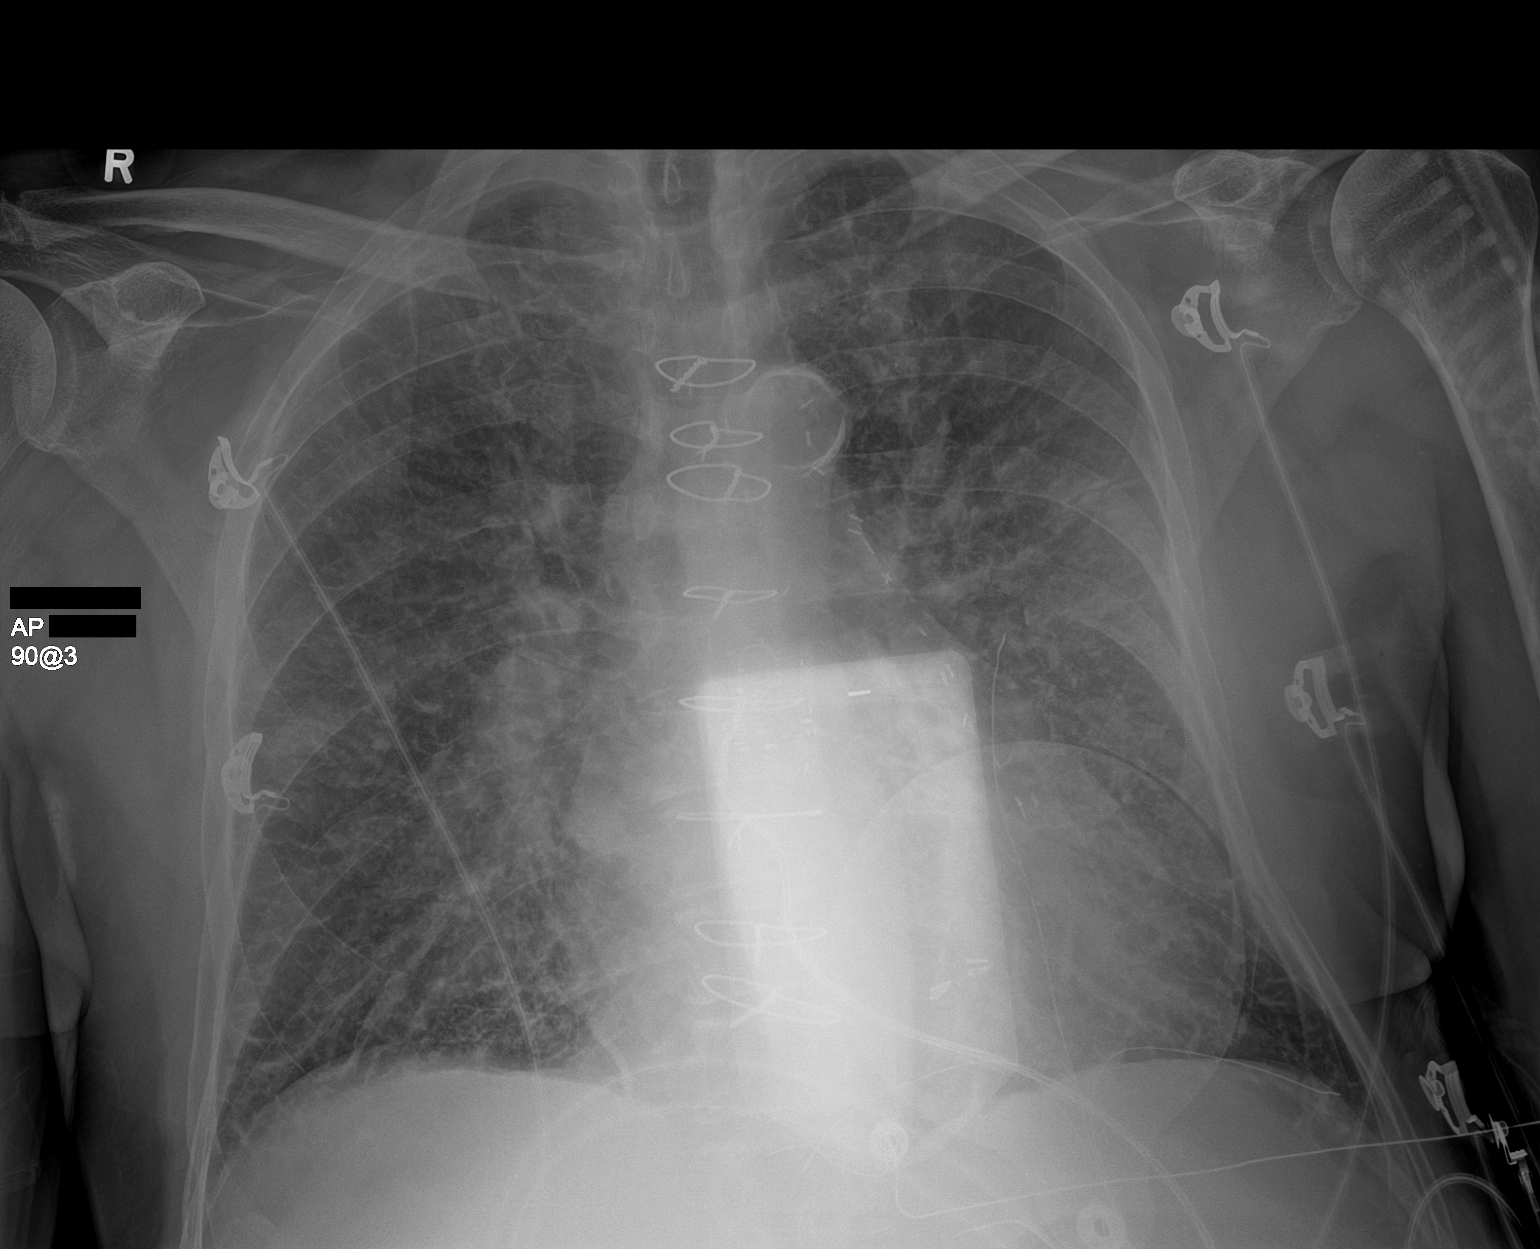

[1 of 1 positions shown; findings below may reference images not displayed]

FINDINGS: There is mild cardiomegaly with vascular congestion and interstitial
edema. Superimposed pneumonia is not excluded. Clinical correlation
is recommended. No focal consolidation, pleural effusion, or
pneumothorax. Median sternotomy wires and CABG vascular clips noted.
Atherosclerotic calcification of the aortic arch. No acute osseous
pathology.
IMPRESSION: Cardiomegaly with vascular congestion and interstitial edema.
Superimposed pneumonia is not excluded.

## 2020-06-30 IMAGING — DX ABDOMEN - 1 VIEW
2 series · 2 of 2 positions shown · non-contrast
Comparison: None

CLINICAL DATA: NG tube placement.

EXAM:
ABDOMEN - 1 VIEW

[abdomen supine (1 of 2)]
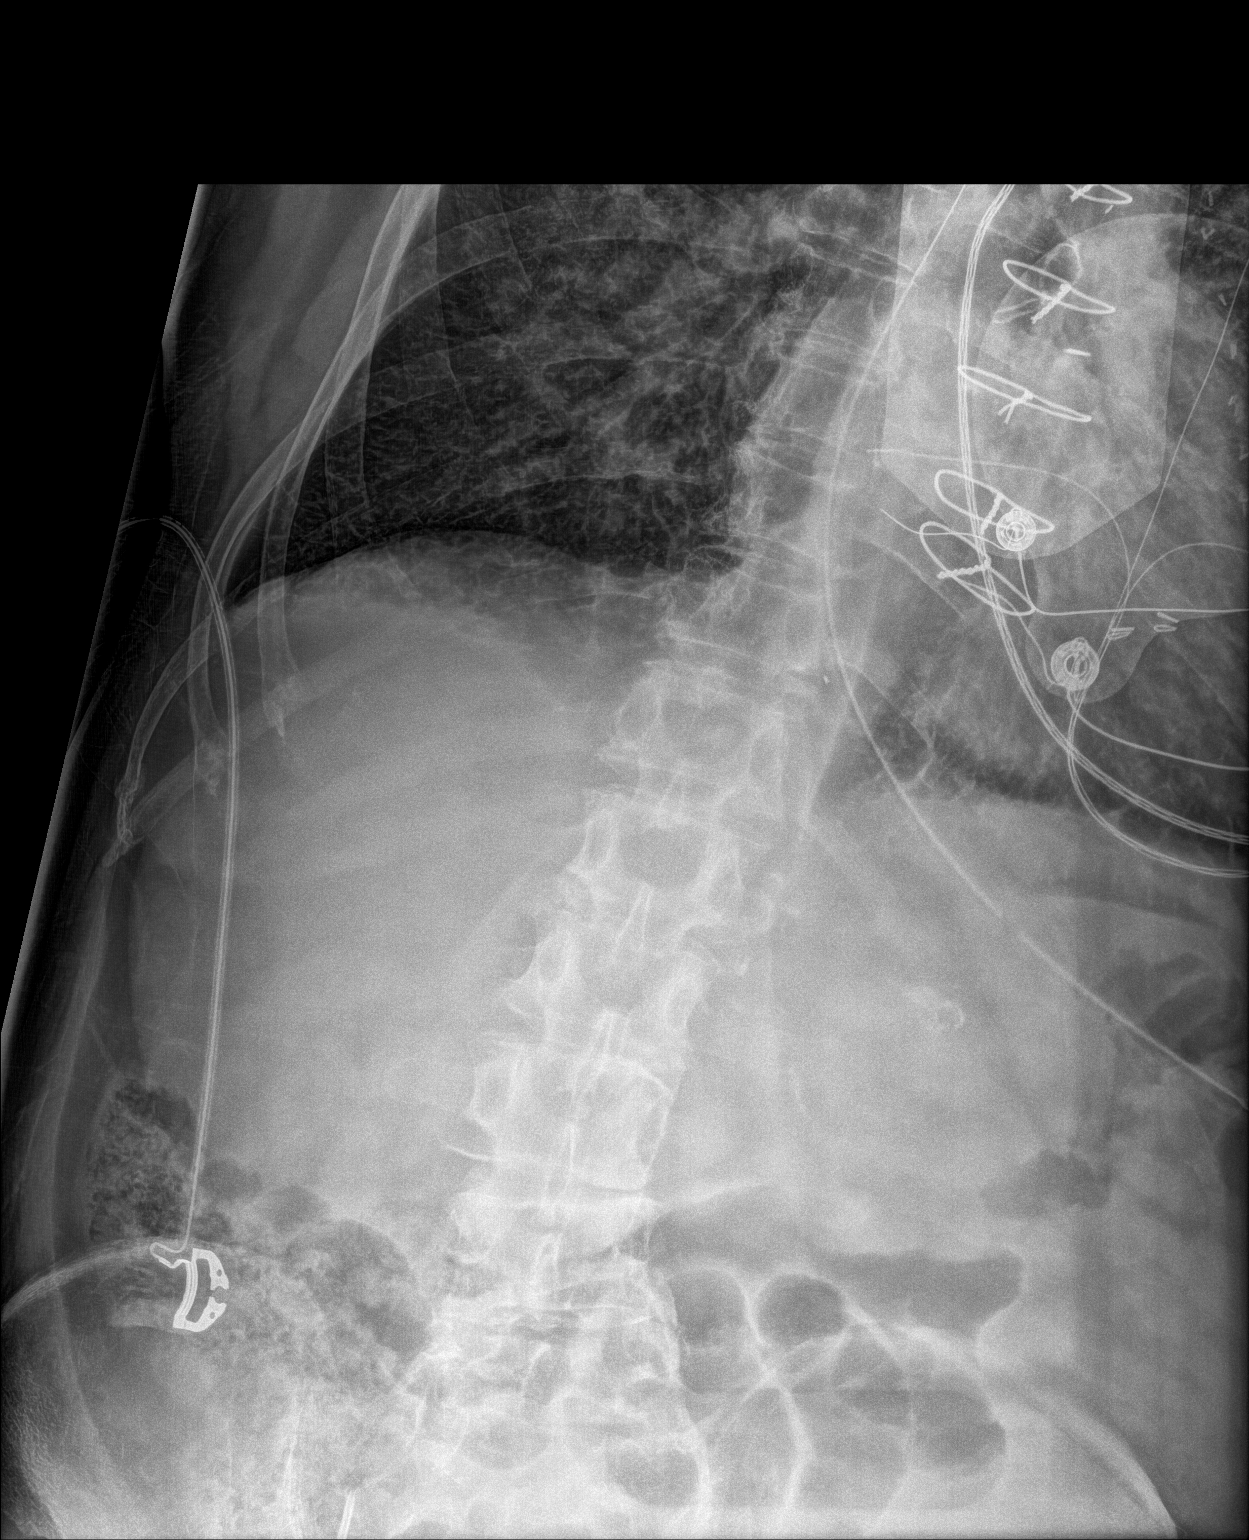

[abdomen supine (2 of 2)]
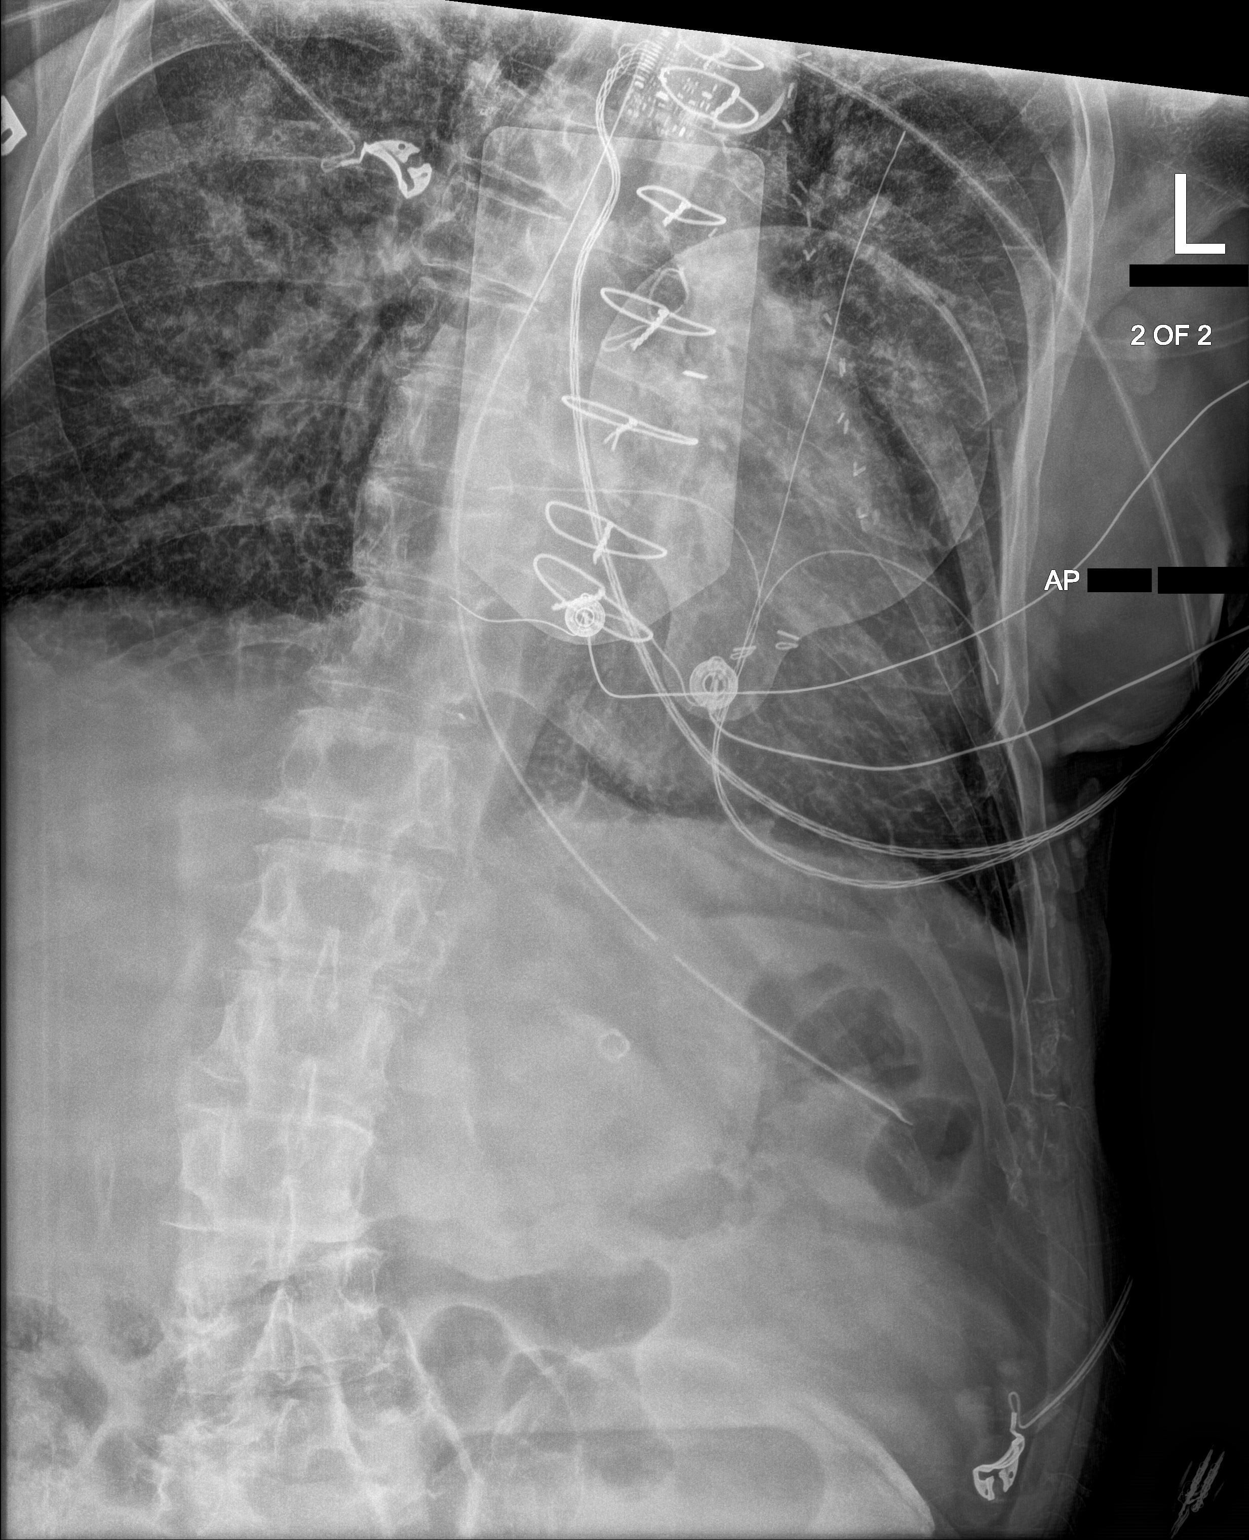

[2 of 2 positions shown; findings below may reference images not displayed]

FINDINGS: An enteric tube terminates in the proximal stomach with side hole
also below the level of the GE junction. Gas and stool are partially
visualized and colon. No dilated loops of bowel are evident in the
included upper portion of the abdomen. Bilateral lung opacities were
more fully evaluated on today's earlier chest radiograph.
IMPRESSION: Enteric tube in the proximal stomach.

## 2020-07-01 IMAGING — DX PORTABLE CHEST - 1 VIEW
1 series · 1 of 1 positions shown · non-contrast
Comparison: 01/06/2019

CLINICAL DATA: Acute respiratory failure

EXAM:
PORTABLE CHEST 1 VIEW

[chest ap]
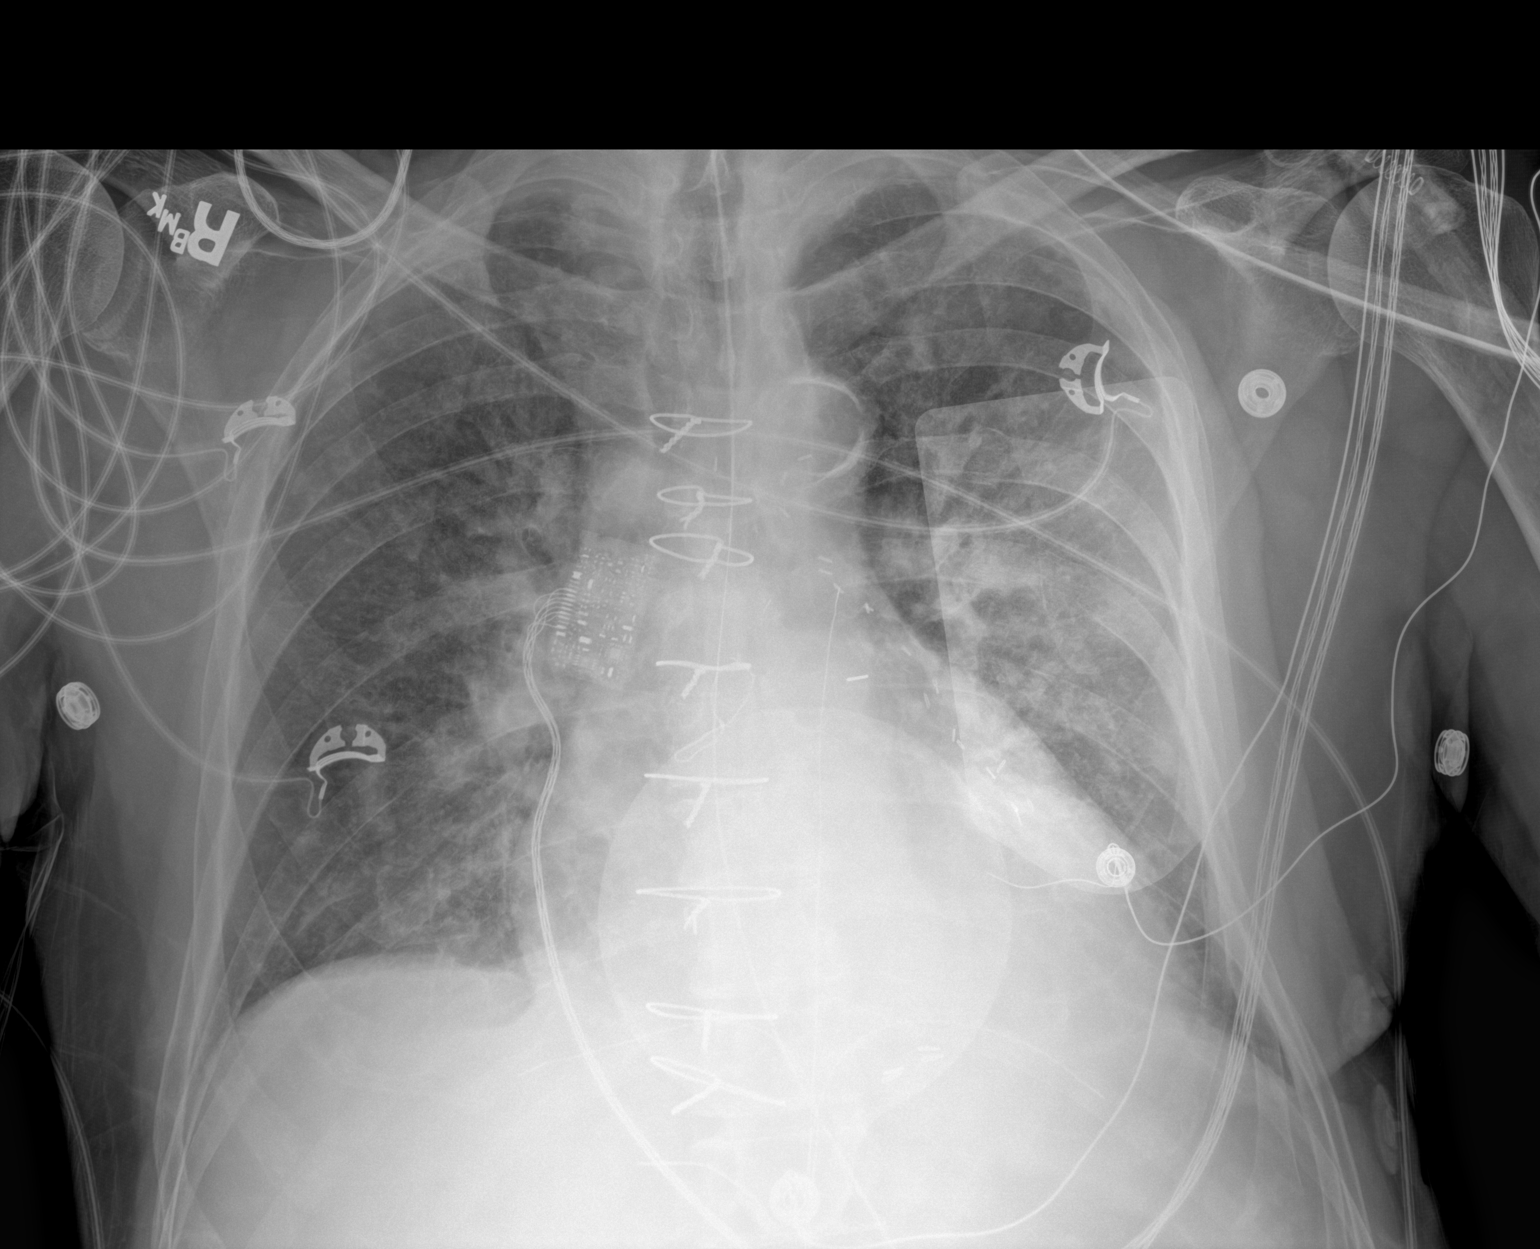

[1 of 1 positions shown; findings below may reference images not displayed]

FINDINGS: Cardiac shadow is stable. Postsurgical changes are again seen.
Endotracheal tube and gastric catheter are noted. Aortic
calcifications are again seen. The lungs are well aerated
bilaterally. Diffuse central vascular congestion with pulmonary
edema is noted stable from the prior exam.
IMPRESSION: Stable changes of pulmonary edema.
